# Patient Record
Sex: Male | Born: 1965 | Race: White | Hispanic: No | Marital: Single | Smoking: Former smoker
Health system: Southern US, Community
[De-identification: ages and names within clinical notes are randomized; demographics above are authoritative.]

## PROBLEM LIST (undated history)

## (undated) DIAGNOSIS — E7800 Pure hypercholesterolemia, unspecified: Secondary | ICD-10-CM

## (undated) DIAGNOSIS — G7100 Muscular dystrophy, unspecified: Secondary | ICD-10-CM

## (undated) DIAGNOSIS — K219 Gastro-esophageal reflux disease without esophagitis: Secondary | ICD-10-CM

## (undated) DIAGNOSIS — E119 Type 2 diabetes mellitus without complications: Secondary | ICD-10-CM

## (undated) HISTORY — DX: Pure hypercholesterolemia, unspecified: E78.00

## (undated) HISTORY — DX: Type 2 diabetes mellitus without complications: E11.9

## (undated) HISTORY — PX: OTHER SURGICAL HISTORY: SHX169

## (undated) HISTORY — DX: Gastro-esophageal reflux disease without esophagitis: K21.9

## (undated) HISTORY — DX: Muscular dystrophy, unspecified: G71.00

---

## 2001-12-09 ENCOUNTER — Ambulatory Visit (HOSPITAL_COMMUNITY): Admission: RE | Admit: 2001-12-09 | Discharge: 2001-12-09 | Payer: Self-pay | Admitting: Gastroenterology

## 2001-12-09 ENCOUNTER — Encounter: Payer: Self-pay | Admitting: Gastroenterology

## 2001-12-17 ENCOUNTER — Encounter: Payer: Self-pay | Admitting: Gastroenterology

## 2001-12-17 ENCOUNTER — Ambulatory Visit (HOSPITAL_COMMUNITY): Admission: RE | Admit: 2001-12-17 | Discharge: 2001-12-17 | Payer: Self-pay | Admitting: Gastroenterology

## 2002-06-09 ENCOUNTER — Ambulatory Visit (HOSPITAL_COMMUNITY): Admission: RE | Admit: 2002-06-09 | Discharge: 2002-06-09 | Payer: Self-pay | Admitting: *Deleted

## 2002-06-09 ENCOUNTER — Encounter: Payer: Self-pay | Admitting: *Deleted

## 2003-03-10 ENCOUNTER — Ambulatory Visit (HOSPITAL_COMMUNITY): Admission: RE | Admit: 2003-03-10 | Discharge: 2003-03-10 | Payer: Self-pay | Admitting: Family Medicine

## 2003-06-10 ENCOUNTER — Encounter: Admission: RE | Admit: 2003-06-10 | Discharge: 2003-09-08 | Payer: Self-pay | Admitting: Family Medicine

## 2003-09-10 ENCOUNTER — Encounter: Admission: RE | Admit: 2003-09-10 | Discharge: 2003-09-10 | Payer: Self-pay | Admitting: Family Medicine

## 2003-12-01 ENCOUNTER — Encounter: Admission: RE | Admit: 2003-12-01 | Discharge: 2003-12-01 | Payer: Self-pay | Admitting: Family Medicine

## 2004-02-11 ENCOUNTER — Encounter: Admission: RE | Admit: 2004-02-11 | Discharge: 2004-05-11 | Payer: Self-pay | Admitting: Family Medicine

## 2004-03-07 ENCOUNTER — Encounter: Admission: RE | Admit: 2004-03-07 | Discharge: 2004-04-18 | Payer: Self-pay

## 2004-05-12 ENCOUNTER — Encounter: Admission: RE | Admit: 2004-05-12 | Discharge: 2004-08-10 | Payer: Self-pay | Admitting: Family Medicine

## 2004-08-11 ENCOUNTER — Encounter: Admission: RE | Admit: 2004-08-11 | Discharge: 2004-11-09 | Payer: Self-pay | Admitting: Family Medicine

## 2010-05-30 ENCOUNTER — Inpatient Hospital Stay (HOSPITAL_COMMUNITY)
Admission: AD | Admit: 2010-05-30 | Discharge: 2010-05-31 | DRG: 394 | Disposition: A | Discharge status: Home / Self Care | Payer: Medicare Other | Source: Ambulatory Visit | Attending: Internal Medicine | Admitting: Internal Medicine

## 2010-05-30 DIAGNOSIS — E785 Hyperlipidemia, unspecified: Secondary | ICD-10-CM | POA: Diagnosis present

## 2010-05-30 DIAGNOSIS — K219 Gastro-esophageal reflux disease without esophagitis: Secondary | ICD-10-CM | POA: Diagnosis present

## 2010-05-30 DIAGNOSIS — E876 Hypokalemia: Secondary | ICD-10-CM | POA: Diagnosis not present

## 2010-05-30 DIAGNOSIS — G7109 Other specified muscular dystrophies: Secondary | ICD-10-CM | POA: Diagnosis present

## 2010-05-30 DIAGNOSIS — K644 Residual hemorrhoidal skin tags: Secondary | ICD-10-CM | POA: Diagnosis present

## 2010-05-30 DIAGNOSIS — K648 Other hemorrhoids: Principal | ICD-10-CM | POA: Diagnosis present

## 2010-05-30 DIAGNOSIS — E119 Type 2 diabetes mellitus without complications: Secondary | ICD-10-CM | POA: Diagnosis present

## 2010-05-30 DIAGNOSIS — I10 Essential (primary) hypertension: Secondary | ICD-10-CM | POA: Diagnosis present

## 2010-05-30 LAB — CBC
HCT: 42.3 % (ref 39.0–52.0)
Hemoglobin: 14.6 g/dL (ref 13.0–17.0)
MCH: 30.4 pg (ref 26.0–34.0)
MCHC: 34.5 g/dL (ref 30.0–36.0)
MCV: 88.1 fL (ref 78.0–100.0)
Platelets: 195 10*3/uL (ref 150–400)
RBC: 4.8 MIL/uL (ref 4.22–5.81)
RDW: 12.9 % (ref 11.5–15.5)
WBC: 7.4 10*3/uL (ref 4.0–10.5)

## 2010-05-30 LAB — COMPREHENSIVE METABOLIC PANEL
AST: 17 U/L (ref 0–37)
CO2: 24 mEq/L (ref 19–32)
Calcium: 9.1 mg/dL (ref 8.4–10.5)
Creatinine, Ser: <0.3 mg/dL — ABNORMAL LOW (ref 0.4–1.5)
Glucose, Bld: 209 mg/dL — ABNORMAL HIGH (ref 70–99)
Total Protein: 7.4 g/dL (ref 6.0–8.3)

## 2010-05-30 LAB — DIFFERENTIAL
Basophils Absolute: 0.1 10*3/uL (ref 0.0–0.1)
Basophils Relative: 1 % (ref 0–1)
Eosinophils Absolute: 0 10*3/uL (ref 0.0–0.7)
Eosinophils Relative: 1 % (ref 0–5)
Lymphocytes Relative: 39 % (ref 12–46)
Lymphs Abs: 2.9 10*3/uL (ref 0.7–4.0)
Monocytes Absolute: 0.5 10*3/uL (ref 0.1–1.0)
Monocytes Relative: 6 % (ref 3–12)
Neutro Abs: 3.9 10*3/uL (ref 1.7–7.7)
Neutrophils Relative %: 54 % (ref 43–77)

## 2010-05-30 LAB — MAGNESIUM: Magnesium: 2.3 mg/dL (ref 1.5–2.5)

## 2010-05-30 LAB — APTT: aPTT: 30 seconds (ref 24–37)

## 2010-05-30 LAB — LIPID PANEL
HDL: 33 mg/dL — ABNORMAL LOW (ref >39)
Total CHOL/HDL Ratio: 4.2 RATIO
VLDL: 44 mg/dL — ABNORMAL HIGH (ref 0–40)

## 2010-05-30 LAB — GLUCOSE, CAPILLARY: Glucose-Capillary: 121 mg/dL — ABNORMAL HIGH (ref 70–99)

## 2010-05-30 LAB — TYPE AND SCREEN: ABO/RH(D): A POS

## 2010-05-30 LAB — PHOSPHORUS: Phosphorus: 3.1 mg/dL (ref 2.3–4.6)

## 2010-05-30 LAB — PROTIME-INR
INR: 1.04 (ref 0.00–1.49)
Prothrombin Time: 13.8 seconds (ref 11.6–15.2)

## 2010-05-31 LAB — CBC
HCT: 40.4 % (ref 39.0–52.0)
MCH: 30.5 pg (ref 26.0–34.0)
MCHC: 34.2 g/dL (ref 30.0–36.0)
MCV: 89.2 fL (ref 78.0–100.0)
RDW: 13 % (ref 11.5–15.5)

## 2010-05-31 LAB — BASIC METABOLIC PANEL
BUN: 5 mg/dL — ABNORMAL LOW (ref 6–23)
Calcium: 8.3 mg/dL — ABNORMAL LOW (ref 8.4–10.5)
Creatinine, Ser: 0.3 mg/dL — ABNORMAL LOW (ref 0.4–1.5)
GFR calc non Af Amer: >60 mL/min (ref >60)
Glucose, Bld: 157 mg/dL — ABNORMAL HIGH (ref 70–99)
Potassium: 3.2 mEq/L — ABNORMAL LOW (ref 3.5–5.1)

## 2010-05-31 LAB — GLUCOSE, CAPILLARY: Glucose-Capillary: 146 mg/dL — ABNORMAL HIGH (ref 70–99)

## 2010-06-06 NOTE — H&P (Signed)
NAME:  DRAYSEN, WEYGANDT NO.:  [id number]  MEDICAL RECORD NO.:  [id number]           PATIENT TYPE:  I  LOCATION:  1528                         FACILITY:  [facility]  PHYSICIAN:  Rosanna Randy, MDDATE OF BIRTH:  1965/11/27  DATE OF ADMISSION:  05/30/2010 DATE OF DISCHARGE:                             HISTORY & PHYSICAL   PRIMARY CARE PHYSICIAN:  Tally Joe, MD, [facility]  CHIEF COMPLAINT:  Bright red blood per rectum.  HISTORY OF PRESENT ILLNESS:  The patient is a 45 year old male with significant past medical history for muscular dystrophy, diabetes mellitus type 2, gastroesophageal reflux disease, and also hyperlipidemia, who was admitted secondary to bright red blood per rectum.  According to the patient, he had been experiencing bright red blood per rectum over the last 3-4 weeks prior to admission intermittently and after discussing with his gastroenterologist, the decision was to bring him into the hospital in order to have a colonoscopy done unfortunately due to the patient's muscular dystrophy and his discapacity, it will be unable to have a good bowel preparation while staying at home.  The patient was also informed that during the preparation, electrolyte abnormalities can happen and they will be safer for him to have the scoping inside the hospital.  Dr. Bosie Clos contacted [facility] to help with the patient's admission and to look over his other medical problems.  The patient denies any nausea, vomiting, fever, chest pain or shortness of breath, and reports just some abdominal discomfort.  The patient also endorses that he experienced some constipation about 3-4 weeks ago for what he was started on MiraLax and since then is not having loose stools.  ALLERGIES:  The patient is allergic to PENICILLIN, allergic reaction rash.  PAST MEDICAL HISTORY:  Significant for: 1. Diabetes. 2. Hyperlipidemia. 3. Gastroesophageal reflux disease. 4.  Muscular dystrophy. 5. Questionable hypertension.  HOME MEDICATIONS:  Include hydrocodone 5/325 one to two tablets twice a day as needed for pain, Onglyza 5 mg 1 tablet by mouth daily, Crestor 5 mg 1 tablet by mouth daily, lisinopril 2.5 mg 1 tablet by mouth daily, Nexium 40 mg 1 capsule by mouth daily, MiraLax 17 g by mouth daily, fish oil over-the-counter 1 capsule by mouth daily.  SOCIAL HISTORY:  The patient denies alcohol, tobacco, or any illicit drugs.  He is currently living with his mother and his father at home, and the mother is the one providing pretty much all the care that he required.  He is at baseline, able to help son moving his arm in order to reposition, but he is unable to ambulate and is wheelchair bound without any assistance.  The patient is unable to change position when he is lying in bed.  FAMILY HISTORY:  Positive for diabetes, hypertension, otherwise noncontributory.  REVIEW OF SYSTEMS:  Ten organ systems were reviewed and they were negative except as mentioned on HPI.  PHYSICAL EXAM:  VITAL SIGNS:  Blood pressure 137/92, heart rate 79, respiratory rate 18, temperature 98.5, oxygen saturation 99% on room air. GENERAL:  The patient was lying in bed, no acute distress.  Cooperative to  examination, providing history appropriately. HEENT:  Head normocephalic without signs of any trauma.  Eyes:  PERRLA. Extraocular muscles intact.  Nonicteric.  No nystagmus.  Negative otorrhea or rhinorrhea.  The patient has moist mucous membranes.  Good dentition.  No exudates or erythema. NECK:  Supple.  No thyromegaly.  No bruits.  No JVD. RESPIRATORY:  Clear to auscultation bilaterally. HEART:  Regular rate and rhythm.  No murmurs were auscultated. ABDOMEN:  Soft, no distention.  No splenomegaly or hepatomegaly. Positive bowel sounds.  There is mild discomfort with deep palpation on his left lower quadrant and also mid abdomen. EXTREMITIES:  Lower extremity atrophic.   No edema.  Upper extremities also with mild atrophy appreciated and limited mobility but still able to perform some movement with them.  No erythema or swelling were appreciated in any of his limbs. SKIN:  No rash.  No active ulcers or lesions were identified.  The patient with mild erythema on his face which is chronic according to the patient and also mother who was at bedside during this interview.  The patient also with some redness in his buttocks.  No other petechiae or other lesions appreciated. NEUROLOGIC:  The patient was alert, awake, and oriented x3.  Cranial nerves II-XII grossly intact.  Muscle strength tested only on his upper extremities which demonstrated to be 3/5 bilaterally symmetrically. Gait was not tested due to the patient's inability to walk. PSYCHIATRIC:  Appropriate.  Laboratory work and images were pending at the moment of this dictation.  ASSESSMENT AND PLAN:  Have a bright red blood per rectum, had been going on for about a month now according to the patient and family members at bedside.  Plan is for the patient to have a colonoscopy in order to try to find out what is the source of this gastrointestinal bleed.  Dr. Bosie Clos of GI is aware of the situation and will be following the patient along with [place].  We will follow the recommendations.  At this point, the patient is going to be admitted to a regular diet since the bleeding is going on for so long and he does not looked to be in any acute distress and hemodynamically stable.  We are going to check his hemoglobin.  We are going to provide fluid resuscitation.  We are going to check for his electrolytes and we are going to provide also bowel prep for colonoscopy to be performed tomorrow. 1. Diabetes.  We are going to stop oral medications while he is in the     hospital due to decrease in his oral intake for the bowel     preparation and we are going to use sliding scale insulin.  The     patient is  going to be started on clear liquid diet.  We are going     to check a hemoglobin A1c. 2. Hyperlipidemia.  We are going to check a fasting lipid profile.  We     are going to continue statins. 3. Gastroesophageal reflux disease.  Plan is to continue Nexium by     mouth daily. 4. Hypertension.  He is a little bit up from a goal for a patient with     diabetes.  He is currently using 2.5 mg of lisinopril which might     be enough to control this level of hypertension and at the same     time provide renal protection due to his diabetes status.  We are  going to continue the same medications while he is in the hospital. 5. The patient's muscular dystrophy.  At this point, we are going to     continue providing supportive care and for     the chronic pain that comes along with this muscular dystrophy, we     are going to use morphine 1-2 mg IV for severe pain.  The rest of     the patient's test orders are going to be determined depending     evaluation of his condition.     Rosanna Randy, MD     CEM/MEDQ  D:  05/30/2010  T:  05/30/2010  Job:  [id number]  cc:   Tally Joe, M.D. Fax: [phone]  Shirley Friar, MD Fax: [phone]  Electronically Signed by Vassie Loll MD on 06/06/2010 10:17:14 PM

## 2010-06-06 NOTE — Discharge Summary (Signed)
NAME:  [name], [name] NO.:  [id number]  MEDICAL RECORD NO.:  [id number]           PATIENT TYPE:  I  LOCATION:  1528                         FACILITY:  [facility]  PHYSICIAN:  Rosanna Randy, MDDATE OF BIRTH:  01-02-1966  DATE OF ADMISSION:  05/30/2010 DATE OF DISCHARGE:  05/31/2010                              DISCHARGE SUMMARY   PRIMARY CARE PHYSICIAN:  Tally Joe, M.D.  GASTROENTEROLOGIST:  Shirley Friar, MD.  DISCHARGE DIAGNOSIS: 1. Bright red blood per rectum secondary to internal hemorrhoids,     status post colonoscopy that demonstrated no active bleeding at     this moment. 2. Hypokalemia after bowel preparation for colonoscopy, repleted prior     to discharge. 3. Diabetes mellitus with a hemoglobin A1c of 8.3. 4. Hypertension. 5. Hyperlipidemia. 6. Gastroesophageal reflux disease. 7. Muscular dystrophy. 8. History of esophageal stricture status post dilatation in 2004.  DISCHARGE MEDICATIONS: 1. Flora-Q 1 capsule by mouth daily. 2. Crestor 5 mg 1 tablet by mouth daily. 3. Fish oil over-the-counter 1 capsule by mouth daily. 4. Hydrocodone 5325 one to two tablets by mouth twice daily as needed     for pain. 5. Lisinopril 2.5 mg 1 tablet by mouth daily. 6. MiraLax 17 grams by mouth daily. Patient received additional     information and instructions to hold the MiraLax if he is having     more than two bowel movement a day. 7. Nexium 40 mg 1 capsule by mouth daily. 8. Onglyza  5 mg 1 tablet by mouth daily.  DISPOSITION ON FOLLOWUP:  The patient had been discharged in a stable condition, currently not having any active bleeding and status post colonoscopy without any complications that demonstrated medium sized internal hemorrhoids, otherwise normal. The patient is going to arrange followup appointment with primary care physician in about 2 weeks in order to have at that time adjustment of the medications for his chronic medical  conditions specifically adjustment of the medication for his diabetes since his hemoglobin A1c is 8.3 and also adjustment of the medications for his blood pressure in case that is needed.  Throughout the hospitalization, the patient's blood pressure was notable meaning that it was more than 130/80 which is not recommended for patients with diabetes but he was on a different vitamin having some abdominal discomfort, stressed out by a bowel perforation and a new procedure and this could definitely be accounted for his elevated blood pressure. This is the main reason why blood pressure needs to be reevaluated as an outpatient.  Any further adjustment needed they can take place as indicated by PCP.  Procedure performed during this hospitalization, the patient had a colonoscopy per Dr. Bosie Clos that demonstrated a medium sized internal hemorrhoids.  No actively bleeding.  Otherwise, normal study.  No other procedures were performed. GI: [facility], specifically Dr. Bosie Clos and also Dr. Michaell Cowing for [facility] were consulted during this admission.  Please refer to consultation notes dictated by each of these physicians for further details and their recommendations.  HISTORY OF PRESENT ILLNESS:  The patient is a 45 year old male with a significant  past medical history for muscular dystrophy, wheelchair bounded  since he was in high school, also with diabetes mellitus type II, gastroesophageal reflux disease and also hyperlipidemia who was admitted secondary to bright red blood per rectum. According to the patient, he had been experiencing bright red blood per rectum over the last 3-4 weeks prior to admission intermittently and after discussing with his gastroenterologist, the decision was to bring the patient into the hospital in order to have a colonoscopy, since the patient due to his muscular dystrophy and disability will be unable to have a good bowel preparation or deal with any  side effects coming after the bowel preparation.  The patient was also informed that during the preparation electrolytes abnormalities can happen and that it will be safer for him to have the scoping inside the hospital.  Dr. Bosie Clos contacted [facility] to help with the patient's admission and to look over his other medical problems.  The patient denies any nausea, vomiting, fever, chest pain, or shortness of breath and report just some abdominal discomfort intermittently going along with his bright red blood per rectum. The patient also endorses that he experienced some constipation about 3 to 4 weeks ago for what he was started on MiraLax and since than having loose stools.  SIGNIFICANT LABORATORY DATA THROUGHOUT THIS HOSPITALIZATION:  The patient had on admission a CBC that demonstrated white blood cells 7.4, hemoglobin 14.6, platelets 195.  PT was 13.8 with an INR of 1.04, PTT 30.  Comprehensive metabolic panel demonstrated a sodium of 138, potassium 3.6, chloride 103, bicarb 24, blood sugar 209, BUN 12, creatinine less than 0.3,  magnesium was 2.3, phosphorus 3.1, and lipid profile done demonstrated a total cholesterol of 138, triglyceride 220, HDL 33, LDL 61, TSH was 0.878, hemoglobin A1c 8.3.  A subsequent CBC that was done in order to follow on the patient's hemoglobin demonstrated white blood cells of 9.8 with a hemoglobin of 13.8 and a platelet count of 202.  This was done on May 31, 2010.  HOSPITAL COURSE BY PROBLEM:  Bright red blood per rectum, status post colonoscopy demonstrated to be secondary to medium sized internal hemorrhoids.  At this point the patient's hemorrhoids were not actively bleeding and the patient is hemodynamically stable. Otherwise, the patient's colonoscopy was completely normal.  Surgery consultation was made and per Dr. Michaell Cowing at this moment since the patient is not having any strangulated or thrombosed hemorrhoids and they are just medium  size internal hemorrhoids, no acute surgical treatment is required.  The patient is going to be followed by primary care physician and also is going to be followed by his GI doctor as an outpatient for followup of his condition.  He had been instructed to use Flora-Q 1 capsule by mouth daily and to continue using MiraLax to prevent constipation and by that erosion of the hemorrhoids. Patient's  hypokalemia secondary to a bowel preparation.  Potassium was repleted prior to the patient being discharged. The patient's diabetes with a hemoglobin A1c of 8.3. The patient had been discharged on his hypoglycemic oral agents with instructions to follow with primary care physician for further adjustment as an outpatient.  He was also instructed to follow a low carbohydrate diet. The patient's hypertension.  He is going to continue using lisinopril and is going to have adjustment of his antihypertensive drugs if needed per primary care doctor as an outpatient. The patient's hyperlipidemia and low triglycerides.  Plan is to continue statins and also fish oil. Patient's  gastroesophageal reflux disease.  Plan is to continue using PPI. Patient's muscular dystrophy. Plan is for the patient to continue receiving supportive care.  At discharge the patient's temperature 98.4, heart rate 91, respiratory rate 18, blood pressure 122/85, oxygen saturation 95% on room air.  In general, the patient was in no acute distress, respiratory system clear to auscultation bilaterally.  Heart auscultation, regular rate and rhythm. S1,S2 were heard on auscultation.  There was no murmurs, gallops or rubs.  Abdomen: Soft, positive bowel sounds, nontender, nondistended. Extremities no edema.  There was atrophic changes appreciated on his upper and lower extremity.  Otherwise normal.  Neurologic exam no focal deficit apart from his chronic muscular dystrophy changes  disability.  Lab work demonstrated a sodium of 139, a  potassium of 3.2 prior to receive repletion with 40 mEq of potassium q. 4 h x3 doses, chloride 107, bicarb 22, BUN 5, creatinine 0.3, platelets 157.     Rosanna Randy, MD     CEM/MEDQ  D:  05/31/2010  T:  06/01/2010  Job:  [id number]  cc:   Tally Joe, M.D. Fax: [phone]  Shirley Friar, MD Fax: [phone]  Electronically Signed by Vassie Loll MD on 06/06/2010 10:17:37 PM

## 2010-06-14 NOTE — Consult Note (Signed)
NAME:  [name], [name] NO.:  [id number]  MEDICAL RECORD NO.:  [id number]           PATIENT TYPE:  I  LOCATION:  1528                         FACILITY:  [facility]  PHYSICIAN:  Ardeth Sportsman, MD     DATE OF BIRTH:  08/07/1965  DATE OF CONSULTATION:  05/30/2010 DATE OF DISCHARGE:                                CONSULTATION   REQUESTING PHYSICIAN:  Shirley Friar, MD.  PRIMARY CARE PHYSICIAN:  Tally Joe, M.D.  REASON FOR CONSULTATION:  Internal hemorrhoids.  BRIEF HISTORY:  The patient is a 45 year old white male with a history of muscular dystrophy.  He has essentially been wheelchair bound since before high school.  He had 3 to 4 weeks of rectal bleeding.  It is not continuous.  It is intermittent.  It was noticed by his mom after bowel movement and after he sits on the toilet for prolonged time, he may have a small amount of blood in the basin.  He was seen by [facility] where they have attempted to treat him medically.  He was on metformin for a time and this was causing diarrhea but he has problems with his stools going back at least December 2011.  He has been treated with MiraLax and he was given Analpram but his a insurance would not pay for that. Anusol suppositories were unable to be retained.  He was admitted at this time for bowel prep and then colonoscopy to make sure there is no other source of bleeding.  Today, after completion of the prep, he underwent colonoscopy by Dr. Bosie Clos.  This showed a small non- thrombosed external hemorrhoids.  Colonoscopy was normal down to the cecum.  There were also medium-sized internal hemorrhoids, which was the source of bleeding in his opinion.  At that point, we were contacted to see the patient while he is in the hospital.  PAST MEDICAL HISTORY: 1. Muscular dystrophy, he can move his arms enough to use a remote     control or a computer mouse but he is unable to reposition himself.     His family gets  him in and out of bed where they live.  He spends     the majority of his time in wheelchair. 2. Adult-onset diabetes mellitus. 3. Dyslipidemia. 4. Questionable hypertension. 5. GERD.  PAST SURGICAL HISTORY:  He had a heel cord procedure in the 1970s.  He has had an esophageal dilatation and a history of external hemorrhoids.  FAMILY HISTORY:  Mom has high blood pressure and diabetes.  Father has diabetes and history of coronary artery disease and CABG.  Mother also has history of colon polyps.  Brother is in good health.  SOCIAL HISTORY:  He smoked from about 2 years but none since 94. Alcohol, occasional.  Drugs, none.  He is single and is cared for at home.  CURRENT MEDICATIONS: 1. Hydrocodone 5/325 q.4. 2. Onglyza 5 mg daily. 3. Crestor 5 mg daily. 4. Lisinopril 2.5 mg daily. 5. Nexium 40 mg daily. 6. MiraLax 17 g daily. 7. Fish oil 1 daily.  REVIEW OF SYSTEMS:  Fever:  None.  Weight:  No changes.  SKIN:  He has occasional sores on his legs after sitting on commode for an extended period.  CEREBROVASCULAR:  Negative.  GI:  He has trouble swallowing. He has to have mechanical soft type of diet.  He has to chew the foods carefully and his mom feeds him.  He has GERD symptoms.  He will sit up on the toilet from anywhere to 1-3 hours to have a bowel movement. Positive for blood in his stool and in the toilet.  PULMONARY:  He has problems with increased secretions especially when he is lying down. They have handled these mostly with mechanically changing his positions. GU:  No trouble voiding.  Currently, he has a condom catheter on.  LOWER EXTREMITIES:  He gets occasional edema.  He is not unable to walk. MUSCULOSKELETAL:  Muscular dystrophy with only some minor upper extremity motion.  ALLERGIES:  PENICILLIN caused a rash many years ago.  PHYSICAL EXAMINATION:  GENERAL:  This is a well nourished, somewhat overweight white male, in no acute distress.  He is just back  from endoscopy, so is a little sleepy. VITAL SIGNS:  Weight is 162.6 pounds.  His height is estimated at 66 inches.  Temperature is 98.4, earlier was 97.7, now heart rate is 93, blood pressure is 120/79, sats are 97% on room air, respiratory rate is 14. HEENT:  Head:  Normocephalic.  Eyes, ears, nose and throat are normal. NECK:  Trachea is in the midline.  Thyroid is palpable. CHEST:  Clear to auscultation. CARDIAC:  Slightly tachy.  Normal S1 and S2.  Pulses are present in both upper and lower extremities. ABDOMEN:  He says he feels bloated but the abdomen shows normal bowel sounds, nondistended, nontender.  There are no masses, hernias or abscesses. RECTAL:  External exam showed no external hemorrhoids.  Dr. Michaell Cowing did do an internal rectal exam and did feel hemorrhoids, which were extremely tender and painful. GENITALIA:  Condom catheter in place. LYMPH NODE:  Lymphadenopathy, none palpated. MUSCULOSKELETAL:  He has both upper extremity and lower extremity weakness.  He cannot turn in bed that on his own.  NEUROLOGIC:  Cranial nerves II through XII are grossly within normal limits.  He has both upper and lower extremity and trunk weakness. PSYCH:  Normal.  LABORATORY DATA:  White count 7.4, hemoglobin 14.6, hematocrit 42, platelets 195,000.  Pro time 13.8, INR is 1.04, PTT is 30.  Electrolytes are normal.  BUN 12, creatinine less than 0.3, glucose was 209, mag was 2.3.  TSH 0.87, phos was 3.1.  Hemoglobin A1c is 8.3 yesterday.  Today, his white count is 9.8, hemoglobin 13.8, hematocrit 40, platelets 202,000.  Sodium is 139, potassium is slightly diminished at 3.2, chloride is 105, CO2 is 22, BUN is 5, creatinine is 0.3.  Endoscopy as noted above.  IMPRESSION: 1. Non-thrombosed external and medium-sized intermediate hemorrhoids.     The external hemorrhoids are currently not visible. 2. Muscular dystrophy, requiring complete support of his major     activities up in the  wheelchair since before he entered high     school. 3. Adult-onset diabetes mellitus. 4. Gastroesophageal reflux disease. 5. Dyslipidemia.  PLAN:  Dr. Michaell Cowing has seen and examined the patient and talked with him and his family extensively.  At this point, he thinks major focus should be to have the patient obtain a single smooth bowel movement daily and to spend much shorter time on the toilet in order to allow  his hemorrhoids to resolve on their own.  He has talked to Dr. Bosie Clos and we will follow him up as needed as an outpatient.     Eber Hong, P.A.   ______________________________ Ardeth Sportsman, MD    WDJ/MEDQ  D:  05/31/2010  T:  05/31/2010  Job:  [id number]  cc:   Shirley Friar, MD Fax: [phone]  Tally Joe, M.D. Fax: [phone]  Electronically Signed by Sherrie George P.A. on 06/06/2010 03:33:36 PM Electronically Signed by Karie Soda MD on 06/14/2010 12:06:01 PM

## 2010-06-30 NOTE — Op Note (Signed)
  NAME:  [name], [name] NO.:  [id number]  MEDICAL RECORD NO.:  [id number]           PATIENT TYPE:  I  LOCATION:  1528                         FACILITY:  [facility]  PHYSICIAN:  Shirley Friar, MDDATE OF BIRTH:  02-01-1965  DATE OF PROCEDURE: DATE OF DISCHARGE:                              OPERATIVE REPORT   INDICATION:  Rectal bleeding.  MEDICATIONS:  Fentanyl 100 mcg IV, Versed 2 mg IV, propofol 100 mg IV per Anesthesia.  FINDINGS:  Rectal examination revealed small non-thrombosed external hemorrhoids.  A pediatric colonoscope was inserted into an adequately prepped colon (good prep) and the colonoscope was advanced to the cecum where the ileocecal valve and appendiceal orifice were identified.  In order to reach the cecum, repeated loop reduction was necessary.  The terminal ileum could not be intubated due to excessive looping.  On careful withdrawal of the colonoscope, the colonic mucosa was normal inappearance.  No mucosal abnormalities were seen.  Retroflexion revealed medium-sized internal hemorrhoids.  On slow withdrawal from the rectum, the hemorrhoids were again visualized.  ASSESSMENT:  Medium-sized internal hemorrhoids, which is the source of his rectal bleeding.  Otherwise normal colonoscopy.  PLAN: 1. We will consult with Surgery regarding inpatient versus outpatient     management of these hemorrhoids as he has failed to respond to     topical therapy. 2. Repeat colonoscopy in 5 years due to mother's history of colon     polyps.     Shirley Friar, MD     VCS/MEDQ  D:  05/31/2010  T:  05/31/2010  Job:  [id number]  cc:   Tally Joe, M.D. Fax: [phone]  Electronically Signed by Charlott Rakes MD on 06/30/2010 10:17:00 PM

## 2011-01-16 DIAGNOSIS — G121 Other inherited spinal muscular atrophy: Secondary | ICD-10-CM | POA: Insufficient documentation

## 2013-07-10 DIAGNOSIS — K625 Hemorrhage of anus and rectum: Secondary | ICD-10-CM | POA: Insufficient documentation

## 2013-08-26 DIAGNOSIS — K645 Perianal venous thrombosis: Secondary | ICD-10-CM | POA: Insufficient documentation

## 2013-12-15 LAB — LIPID PANEL
Cholesterol: 146 mg/dL (ref 0–200)
HDL: 33 mg/dL — AB (ref 35–70)
LDL CALC: 60 mg/dL
LDL/HDL RATIO: 4.4
TRIGLYCERIDES: 266 mg/dL — AB (ref 40–160)

## 2013-12-15 LAB — HEPATIC FUNCTION PANEL
ALT: 19 U/L (ref 10–40)
AST: 13 U/L — AB (ref 14–40)
Alkaline Phosphatase: 50 U/L (ref 25–125)
BILIRUBIN, TOTAL: 0.3 mg/dL

## 2013-12-15 LAB — BASIC METABOLIC PANEL
BUN: 12 mg/dL (ref 4–21)
Creatinine: 0.2 mg/dL — AB (ref 0.6–1.3)
GLUCOSE: 209 mg/dL
Potassium: 3.9 mmol/L (ref 3.4–5.3)
Sodium: 136 mmol/L — AB (ref 137–147)

## 2013-12-15 LAB — HEMOGLOBIN A1C: HEMOGLOBIN A1C: 8.9 % — AB (ref 4.0–6.0)

## 2014-02-09 ENCOUNTER — Encounter: Payer: Self-pay | Admitting: Gastroenterology

## 2014-02-11 ENCOUNTER — Encounter: Payer: Self-pay | Admitting: Internal Medicine

## 2014-02-11 ENCOUNTER — Other Ambulatory Visit: Payer: Self-pay | Admitting: *Deleted

## 2014-02-11 ENCOUNTER — Ambulatory Visit (INDEPENDENT_AMBULATORY_CARE_PROVIDER_SITE_OTHER): Payer: Medicare Other | Admitting: Internal Medicine

## 2014-02-11 VITALS — BP 124/70 | HR 93 | Temp 98.3°F | Resp 12 | Wt 165.0 lb

## 2014-02-11 DIAGNOSIS — E119 Type 2 diabetes mellitus without complications: Secondary | ICD-10-CM

## 2014-02-11 DIAGNOSIS — E1165 Type 2 diabetes mellitus with hyperglycemia: Secondary | ICD-10-CM | POA: Insufficient documentation

## 2014-02-11 MED ORDER — ONETOUCH DELICA LANCETS 33G MISC: Status: AC

## 2014-02-11 MED ORDER — METFORMIN HCL 500 MG/5ML PO SOLN: ORAL | Status: DC

## 2014-02-11 MED ORDER — GLUCOSE BLOOD VI STRP: ORAL_STRIP | Status: DC

## 2014-02-11 NOTE — Patient Instructions (Addendum)
Please continue Onglyza 5 mg in am.  Please start Metformin 500 mg (5 mL, 1 teaspoon) with dinner x 4 days. If you tolerate this well, add another Metformin tablet (500 mg) with breakfast x 4 days. If you tolerate this well, add another metformin tablet with dinner (total 1000 mg) x 4 days. If you tolerate this well, add another metformin tablet with breakfast (total 1000 mg). Continue with 1000 mg (10 mL, 2 teaspoons) of metformin 2x a day with breakfast and dinner.  Please return in 1 month with your sugar log.

## 2014-02-11 NOTE — Progress Notes (Signed)
Patient ID: UNNAMED ZEIEN, male   DOB: 08-05-1965, 49 y.o.   MRN: [id number]  HPI: [name] is a 49 y.o.-year-old male, referred by his PCP, Dr. Azucena Cecil, for management of DM2, dx in2003, non-insulin-dependent, uncontrolled, without complications. He has [employer] now.  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 In the last 2 years, his swallowing is more impaired. Mother is primary caregiver.   Hemoglobin A1c levels reviewed per records from PCP and per Epic records: 12/15/2013: HbA1c 8.9% 06/12/2013: HbA1c 7.8% 12/13/2012: HbA1c 7.5% 12/07/2011: HbA1c 6.9% Lab Results  Component Value Date   HGBA1C 8.3 05/30/2010   Pt is on a regimen of: - Onglyza 5 mg daily He tried Metformin >> diarrhea. He saw Dr Lucianne Muss then >> decreased the dose >> this helped, but then taken off when started Onglyza.  Pt does not check his sugars. Does not have a meter at home.  ? lows; he has hypoglycemia awareness at 70.   Pt's meals are: - Breakfast: skips - Lunch: soup, sandwich, burger, diet Mtn dew - Dinner: grilled chicken, veggies, starch, diet sods - Snacks: PB, cookie  - no CKD, last BUN/creatinine:  06/12/2013: 12/0.20 Lab Results  Component Value Date   BUN 5* 05/31/2010   CREATININE 0.30* 05/31/2010   - last set of lipids: 12/15/2013: 146/266/33/60 Lab Results  Component Value Date   CHOL  05/30/2010    138        ATP III CLASSIFICATION:  <200     mg/dL   Desirable  353-614  mg/dL   Borderline High  >=431    mg/dL   High          HDL 33* 05/30/2010   LDLCALC  05/30/2010    61        Total Cholesterol/HDL:CHD Risk Coronary Heart Disease Risk Table                     Men   Women  1/2 Average Risk   3.4   3.3  Average Risk       5.0   4.4  2 X Average Risk   9.6   7.1  3 X Average Risk  23.4   11.0        Use the calculated Patient Ratio above and the CHD Risk Table to determine the patient's CHD Risk.        ATP III  CLASSIFICATION (LDL):  <100     mg/dL   Optimal  540-086  mg/dL   Near or Above                    Optimal  130-159  mg/dL   Borderline  761-950  mg/dL   High  >932     mg/dL   Very High   TRIG 671* 05/30/2010   CHOLHDL 4.2 05/30/2010   - last eye exam was in 12/2013. No DR. - no numbness and tingling in his feet. Has good sensation, but cannot move. He cannot move his legs  Pt has FH of DM in mother and father.  ROS: Constitutional: no weight gain/loss, no fatigue, no subjective hyperthermia/hypothermia, + nocturiaEyes: no blurry vision, no xerophthalmia ENT: no sore throat, no nodules palpated in throat, + dysphagia/no odynophagia, no hoarseness Cardiovascular: no CP/SOB/palpitations/leg swelling Respiratory: no cough/SOB Gastrointestinal: no N/V/+ D/no C/+ acid reflux Musculoskeletal: no muscle/joint aches Skin: no rashes Neurological: no  tremors/numbness/tingling/dizziness Psychiatric: no depression/anxiety  Past Medical History  Diagnosis Date  . Diabetes mellitus without complication     DM Type II   . GERD (gastroesophageal reflux disease)   . Hypercholesterolemia   . Muscular dystrophy    No past surgical history on file. History   Social History  . Marital Status: single    Spouse Name: N/A    Number of Children: 0   Occupational History  . disabled   Social History Main Topics  . Smoking status: Former Games developermoker  . Smokeless tobacco: Not on file  . Alcohol Use: No  . Drug Use: No   Current Outpatient Rx  Name  Route  Sig  Dispense  Refill  . esomeprazole (NEXIUM) 40 MG capsule   Oral   Take 40 mg by mouth daily.          Marland Kitchen. HYDROcodone-acetaminophen (HYCET) 7.5-325 mg/15 ml solution   Oral   Take 15 mLs by mouth at bedtime.          Marland Kitchen. lisinopril (PRINIVIL,ZESTRIL) 2.5 MG tablet   Oral   Take 2.5 mg by mouth.         . mesalamine (CANASA) 1000 MG suppository   Rectal   Place 1,000 mg rectally.         . rosuvastatin (CRESTOR) 5 MG  tablet   Oral   Take 5 mg by mouth.         . saxagliptin HCl (ONGLYZA) 5 MG TABS tablet   Oral   Take 5 mg by mouth.         . gabapentin (NEURONTIN) 250 MG/5ML solution   Oral   Take by mouth 3 (three) times daily.         Marland Kitchen. glucose blood (ONETOUCH VERIO) test strip      Use to test blood sugar 2 times daily as instructed.   100 each   11   . Metformin HCl 500 MG/5ML SOLN      Take 10 ml (2 teaspoons, 1000 mg) by mouth 2x a day with a meal   300 mL   2   . ONETOUCH DELICA LANCETS 33G MISC      Use to test blood sugar 2 times daily as instructed.   100 each   11    Allergies  Allergen Reactions  . Penicillins Hives   Family History  Problem Relation Age of Onset  . Diabetes Mother   . Colon polyps Mother   . Heart disease Father     CAD  . Diabetes Father    PE: BP 124/70 mmHg  Pulse 93  Temp(Src) 98.3 F (36.8 C) (Oral)  Resp 12  Wt 165 lb (74.844 kg)  SpO2 98% Wt Readings from Last 3 Encounters:  02/11/14 165 lb (74.844 kg)   Constitutional: overweight, in NAD, in wheelchair. Moves few mm groups, but can talk and eat (see HPI)  Eyes: PERRLA, EOMI, no exophthalmos ENT: moist mucous membranes, no thyromegaly, no cervical lymphadenopathy Cardiovascular: RRR, No MRG Respiratory: CTA B Gastrointestinal: abdomen soft, NT, ND, BS+ Musculoskeletal: tetraparesis Skin: moist, warm, no rashes Neurological: tetraparesis  ASSESSMENT: 1. DM2, non-insulin-dependent, uncontrolled, without complications  PLAN:  1. Patient with long-standing, uncontrolled diabetes, on oral antidiabetic regimen, which became insufficient. I do not have CBG data to go by >> pt was given a OneTouch Verio meter, will start checking and bring log at next visit.  - We discussed about options for treatment, and I suggested  to add liquid metformin as he has impaired swallowing due to his disease:  Patient Instructions  Please continue Onglyza 5 mg in am.  Please start Metformin  500 mg (5 mL, 1 teaspoon) with dinner x 4 days. If you tolerate this well, add another Metformin tablet (500 mg) with breakfast x 4 days. If you tolerate this well, add another metformin tablet with dinner (total 1000 mg) x 4 days. If you tolerate this well, add another metformin tablet with breakfast (total 1000 mg). Continue with 1000 mg (10 mL, 2 teaspoons) of metformin 2x a day with breakfast and dinner.  Please return in 1 month with your sugar log.   - Strongly advised him to start checking sugars at different times of the day (actually his mother will do the checks) - check 2 times a day, rotating checks - given sugar log and advised how to fill it and to bring it at next appt  - given foot care handout and explained the principles  - given instructions for hypoglycemia management "15-15 rule"  - advised for yearly eye exams - we contacted PCP for records at the time of the appt >> reviewed them  - will scan - Return to clinic in 1 mo with sugar log

## 2014-03-04 ENCOUNTER — Telehealth: Payer: Self-pay | Admitting: Internal Medicine

## 2014-03-04 NOTE — Telephone Encounter (Signed)
Patient mom stated that her son medication Metformin 500 mg is giving her son diarrhea. Please advise

## 2014-03-04 NOTE — Telephone Encounter (Signed)
I was unable to put this in. I tried many different ways. Pt is on the solution formula. Please advise.

## 2014-03-04 NOTE — Telephone Encounter (Signed)
Let's try the ER Metformin - please try to send the Glucophage XR (DAW!) at the same doses - start with 500 mg bid and increase slowly.

## 2014-03-04 NOTE — Telephone Encounter (Signed)
Please read note below and advise.  

## 2014-03-05 MED ORDER — GLIPIZIDE ER 5 MG PO TB24: 5.0000 mg | ORAL_TABLET | Freq: Every day | ORAL | Status: DC

## 2014-03-05 NOTE — Telephone Encounter (Signed)
Sent to [facility], [address]

## 2014-03-05 NOTE — Telephone Encounter (Signed)
I am not seeing Glipizide XL in the solution formula. Please advise.

## 2014-03-05 NOTE — Telephone Encounter (Signed)
Oh, yes, I forgot he is on liquid metformin. This cannot come in an XR version. Let's start Glipizide XL 5 mg in am. Check sugars more frequently in the first 1-2 days after starting to ensure there are no low CBGs.

## 2014-03-05 NOTE — Telephone Encounter (Signed)
No, this is a much smaller pill >> he can likely swallow it in tablet form

## 2014-03-17 ENCOUNTER — Ambulatory Visit: Payer: Medicaid Other | Admitting: Internal Medicine

## 2014-03-23 ENCOUNTER — Encounter: Payer: Self-pay | Admitting: Internal Medicine

## 2014-03-23 ENCOUNTER — Ambulatory Visit (INDEPENDENT_AMBULATORY_CARE_PROVIDER_SITE_OTHER): Payer: Medicare Other | Admitting: Internal Medicine

## 2014-03-23 VITALS — BP 112/68 | HR 88 | Temp 98.7°F | Resp 12

## 2014-03-23 DIAGNOSIS — E119 Type 2 diabetes mellitus without complications: Secondary | ICD-10-CM | POA: Diagnosis not present

## 2014-03-23 LAB — HEMOGLOBIN A1C: Hgb A1c MFr Bld: 8.6 % — ABNORMAL HIGH (ref 4.6–6.5)

## 2014-03-23 MED ORDER — INSULIN GLARGINE 100 UNIT/ML SOLOSTAR PEN: 18.0000 [IU] | PEN_INJECTOR | Freq: Every day | SUBCUTANEOUS | Status: DC

## 2014-03-23 MED ORDER — INSULIN PEN NEEDLE 32G X 4 MM MISC: Status: DC

## 2014-03-23 NOTE — Patient Instructions (Signed)
Please continue: - Onglyza 5 mg daily in am - Glipizide XL 5 mg in am  Start Lantus 18 units at bedtime. If sugars in am not <150 in 4-5 days, increase the dose to 22 units.  Please call me about your sugars in 2 weeks.  Please return in 1 month with your sugar log.   Please stop at the lab.

## 2014-03-23 NOTE — Progress Notes (Signed)
Patient ID: [name], male   DOB: 12-Jun-1965, 49 y.o.   MRN: [id number]  HPI: [name] is a 49 y.o.-year-old male, referred by his PCP, Dr. Azucena Cecil, for management of DM2, dx in2003, insulin-dependent, uncontrolled, without complications. He has [employer] now.  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 In the last 2 years, his swallowing is more impaired. Mother is primary caregiver.   Hemoglobin A1c levels reviewed per records from PCP and per Epic records: 12/15/2013: HbA1c 8.9% 06/12/2013: HbA1c 7.8% 12/13/2012: HbA1c 7.5% 12/07/2011: HbA1c 6.9% Lab Results  Component Value Date   HGBA1C 8.3 05/30/2010   Pt is on a regimen of: - Onglyza 5 mg daily - Glipizide XL 5 mg in am He tried Metformin >> diarrhea. He saw Dr Lucianne Muss then >> decreased the dose >> this helped, but then taken off when started Onglyza. We tried again Metformin >> diarrhea >> now stopped.   Pt does check his sugars at home:  - before lunch: 205-390  - 2h after lunch: 210-268 - before dinner: 238-315 - after dinner: 222-278  No lows; he has hypoglycemia awareness at 70.   Pt's meals are: - Breakfast: skips - Lunch: soup, sandwich, burger, diet Mtn dew - Dinner: grilled chicken, veggies, starch, diet sods - Snacks: PB, cookie  - no CKD, last BUN/creatinine:  06/12/2013: 12/0.20 Lab Results  Component Value Date   BUN 12 12/15/2013   CREATININE 0.2* 12/15/2013   - last set of lipids: 12/15/2013: 146/266/33/60 Lab Results  Component Value Date   CHOL 146 12/15/2013   HDL 33* 12/15/2013   LDLCALC 60 12/15/2013   TRIG 266* 12/15/2013   CHOLHDL 4.2 05/30/2010   - last eye exam was in 12/2013. No DR. - no numbness and tingling in his feet. Has good sensation, but cannot move. He cannot move his legs  Pt has FH of DM in mother and father.  ROS: Constitutional: no weight gain/loss, no fatigue, no subjective hyperthermia/hypothermia, +  nocturia Eyes: no blurry vision, no xerophthalmia ENT: no sore throat, no nodules palpated in throat, + dysphagia/no odynophagia, no hoarseness Cardiovascular: no CP/SOB/palpitations/leg swelling Respiratory: + cough/no SOB Gastrointestinal: no N/V/D/C/+ acid reflux Musculoskeletal: no muscle/joint aches Skin: no rashes Neurological: no tremors/numbness/tingling/dizziness  Past Medical History  Diagnosis Date  . Diabetes mellitus without complication     DM Type II   . GERD (gastroesophageal reflux disease)   . Hypercholesterolemia   . Muscular dystrophy    No past surgical history on file. History   Social History  . Marital Status: single    Spouse Name: N/A    Number of Children: 0   Occupational History  . disabled   Social History Main Topics  . Smoking status: Former Games developer  . Smokeless tobacco: Not on file  . Alcohol Use: No  . Drug Use: No   Current Outpatient Rx  Name  Route  Sig  Dispense  Refill  . esomeprazole (NEXIUM) 40 MG capsule   Oral   Take 40 mg by mouth daily.          Marland Kitchen gabapentin (NEURONTIN) 250 MG/5ML solution   Oral   Take by mouth 3 (three) times daily.         Marland Kitchen glipiZIDE (GLUCOTROL XL) 5 MG 24 hr tablet   Oral   Take 1 tablet (5 mg total) by mouth daily with breakfast.   30 tablet  2     ADVISE PT SMALL PILL WILL BE ABLE TO TAKE THIS.   . glucose blood (ONETOUCH VERIO) test strip      Use to test blood sugar 2 times daily as instructed.   100 each   11   . HYDROcodone-acetaminophen (HYCET) 7.5-325 mg/15 ml solution   Oral   Take 15 mLs by mouth at bedtime.          Marland Kitchen. lisinopril (PRINIVIL,ZESTRIL) 2.5 MG tablet   Oral   Take 2.5 mg by mouth.         . mesalamine (CANASA) 1000 MG suppository   Rectal   Place 1,000 mg rectally.         Letta Pate. ONETOUCH DELICA LANCETS 33G MISC      Use to test blood sugar 2 times daily as instructed.   100 each   11   . rosuvastatin (CRESTOR) 5 MG tablet   Oral   Take 5 mg by  mouth.         . saxagliptin HCl (ONGLYZA) 5 MG TABS tablet   Oral   Take 5 mg by mouth.         . Metformin HCl 500 MG/5ML SOLN      Take 10 ml (2 teaspoons, 1000 mg) by mouth 2x a day with a meal Patient not taking: Reported on 03/23/2014   300 mL   2    Allergies  Allergen Reactions  . Penicillins Hives   Family History  Problem Relation Age of Onset  . Diabetes Mother   . Colon polyps Mother   . Heart disease Father     CAD  . Diabetes Father    PE: BP 112/68 mmHg  Pulse 88  Temp(Src) 98.7 F (37.1 C) (Oral)  Resp 12  SpO2 99% Wt Readings from Last 3 Encounters:  02/11/14 165 lb (74.844 kg)   Constitutional: overweight, in NAD, in wheelchair. Moves few mm groups, but can talk and eat (see HPI)  Eyes: PERRLA, EOMI, no exophthalmos ENT: moist mucous membranes, no thyromegaly, no cervical lymphadenopathy Cardiovascular: RRR, No MRG Respiratory: CTA B Gastrointestinal: abdomen soft, NT, ND, BS+ Musculoskeletal: tetraparesis Skin: moist, warm, no rashes Neurological: tetraparesis  ASSESSMENT: 1. DM2, insulin-dependent, uncontrolled, without complications  PLAN:  1. Patient with long-standing, uncontrolled diabetes, on oral antidiabetic regimen, which became insufficient. Sugars are very high >> needs basal insulin >> pt agrees. Mom and dad both insulin-dep. Diabetic pts >> he knows about the pens and how to inject. Mom will give him the injections. Patient Instructions  Please continue: - Onglyza 5 mg daily in am - Glipizide XL 5 mg in am  Start Lantus 18 units at bedtime. If sugars in am not <150 in 4-5 days, increase the dose to 22 units.  Please call me about your sugars in 2 weeks.  Please return in 1 month with your sugar log.   Please stop at the lab. - discussed proper inj technique: When injecting insulin:  Inject in the abdomen  Rotate the injection sites around the belly button  Change needle for each injection  Keep needle in for 10  sec after last unit of insulin in - continue checking sugars at different times of the day - check 2 times a day, rotating checks - given more sugar logs  - advised for yearly eye exams - he is UTD - will check HbA1c today - Return to clinic in 1 mo with sugar log   Office  Visit on 03/23/2014  Component Date Value Ref Range Status  . Hgb A1c MFr Bld 03/23/2014 8.6* 4.6 - 6.5 % Final   Glycemic Control Guidelines for People with Diabetes:Non Diabetic:  <6%Goal of Therapy: <7%Additional Action Suggested:  >8%    HbA1c a little better.

## 2014-03-24 ENCOUNTER — Encounter: Payer: Self-pay | Admitting: *Deleted

## 2014-04-06 ENCOUNTER — Telehealth: Payer: Self-pay | Admitting: Internal Medicine

## 2014-04-06 NOTE — Telephone Encounter (Signed)
Called pt's mother and advised her per Dr Charlean SanfilippoGherghe's note. She understood and will call in 1 weeks with sugar readings.

## 2014-04-06 NOTE — Telephone Encounter (Signed)
Yes, continue to add 3 more units every 3 days (target sugars <130 before meals and <180 after meals) and call with sugars in a week.

## 2014-04-06 NOTE — Telephone Encounter (Signed)
Dr. Elvera LennoxGherghe wanted pt to call and let her know how surgars are doing and they are running over 200 everyday for the past two weeks

## 2014-04-06 NOTE — Telephone Encounter (Signed)
Returned call and spoke with pt's mother. Pt's b/s has been over 200 ea time she has checked. 7:30- 8:00 am, 2-3 pm and 7-8 pm. She increased his Lantus to 22 units. Please advise.

## 2014-04-13 ENCOUNTER — Telehealth: Payer: Self-pay | Admitting: Internal Medicine

## 2014-04-13 NOTE — Telephone Encounter (Signed)
Please read message below and advise.  

## 2014-04-13 NOTE — Telephone Encounter (Signed)
Yes, continue to do so.

## 2014-04-13 NOTE — Telephone Encounter (Signed)
Pt did as instructed and increased lantus by 3 u there has not been much change to the blood sugar. It is still high.

## 2014-04-14 NOTE — Telephone Encounter (Signed)
Called and spoke to pt's mother and advised her per Dr Charlean SanfilippoGherghe's note to increase Lantus by 3 units and call back by the end of the week to let us know how his sugar are. She understood.

## 2014-04-17 ENCOUNTER — Telehealth: Payer: Self-pay | Admitting: Internal Medicine

## 2014-04-17 NOTE — Telephone Encounter (Signed)
Please read message below and advise.  

## 2014-04-17 NOTE — Telephone Encounter (Signed)
Mother calling back to tell how pt has been doing on increase lantus  , sugar has been running around 120- 200 today was the 119

## 2014-04-20 NOTE — Telephone Encounter (Signed)
Patient's mother informed

## 2014-04-20 NOTE — Telephone Encounter (Signed)
Improved! Continue same regimen for now. Can increase Lantus by 3 units every 3 days if the sugars are consistently >150 in am and there are no lows.

## 2014-04-23 ENCOUNTER — Encounter: Payer: Self-pay | Admitting: Internal Medicine

## 2014-04-23 ENCOUNTER — Ambulatory Visit (INDEPENDENT_AMBULATORY_CARE_PROVIDER_SITE_OTHER): Payer: Medicare Other | Admitting: Internal Medicine

## 2014-04-23 VITALS — BP 108/78 | HR 97 | Temp 98.6°F | Resp 12

## 2014-04-23 DIAGNOSIS — E119 Type 2 diabetes mellitus without complications: Secondary | ICD-10-CM | POA: Diagnosis not present

## 2014-04-23 MED ORDER — INSULIN GLARGINE 300 UNIT/ML ~~LOC~~ SOPN: 34.0000 [IU] | PEN_INJECTOR | Freq: Every day | SUBCUTANEOUS | Status: DC

## 2014-04-23 MED ORDER — ALBIGLUTIDE 50 MG ~~LOC~~ PEN: PEN_INJECTOR | SUBCUTANEOUS | Status: DC

## 2014-04-23 NOTE — Progress Notes (Signed)
Patient ID: [name], male   DOB: [birth year], 49 y.o.   MRN: [id number]  HPI: [name] is a 49 y.o.-year-old male, referred by his PCP, Dr. Azucena Cecil, for management of DM2, dx in2003, insulin-dependent, uncontrolled, without complications. He has [employer] now.  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 In the last 2 years, his swallowing is more impaired. Mother is primary caregiver.   Hemoglobin A1c levels reviewed per records from PCP and per Epic records: Lab Results  Component Value Date   HGBA1C 8.6* 03/23/2014   HGBA1C 8.9* 12/15/2013   HGBA1C * 05/30/2010    8.3 (NOTE)                                                                       According to the ADA Clinical Practice Recommendations for 2011, when HbA1c is used as a screening test:   >=6.5%   Diagnostic of Diabetes Mellitus           (if abnormal result  is confirmed)  5.7-6.4%   Increased risk of developing Diabetes Mellitus  References:Diagnosis and Classification of Diabetes Mellitus,Diabetes Care,2011,34(Suppl 1):S62-S69 and Standards of Medical Care in         Diabetes - 2011,Diabetes Care,2011,34  (Suppl 1):S11-S61.   12/15/2013: HbA1c 8.9% 06/12/2013: HbA1c 7.8% 12/13/2012: HbA1c 7.5% 12/07/2011: HbA1c 6.9% Lab Results  Component Value Date   HGBA1C 8.3 05/30/2010   Pt is on a regimen of: - Onglyza 5 mg daily - Glipizide XL 5 mg in am - Lantus 18 >> 34 units at bedtime. He tried Metformin >> diarrhea. We tried again Metformin >> diarrhea >> now stopped.   Pt does check his sugars 1-3x a day - much better:  - am:116-185 - before lunch: 205-390 >> 119-152, 186 - 2h after lunch: 210-268 >> n/c - before dinner: 238-315 >> 129 - after dinner: 222-278 >> 204 - bedtime: 197  No lows; he has hypoglycemia awareness at 70.   Pt's meals are: - Breakfast: skips - Lunch: soup, sandwich, burger, diet Mtn dew - Dinner: grilled chicken, veggies, starch, diet  sods - Snacks: PB, cookie  - no CKD, last BUN/creatinine:  06/12/2013: 12/0.20 Lab Results  Component Value Date   BUN 12 12/15/2013   CREATININE 0.2* 12/15/2013   - last set of lipids: 12/15/2013: 146/266/33/60 Lab Results  Component Value Date   CHOL 146 12/15/2013   HDL 33* 12/15/2013   LDLCALC 60 12/15/2013   TRIG 266* 12/15/2013   CHOLHDL 4.2 05/30/2010   - last eye exam was in 12/2013. No DR. - no numbness and tingling in his feet. Has good sensation, but cannot move. He cannot move his legs  ROS: Constitutional: no weight gain/loss, no fatigue, no subjective hyperthermia/hypothermia Eyes: no blurry vision, no xerophthalmia ENT: no sore throat, no nodules palpated in throat, + dysphagia/no odynophagia, no hoarseness Cardiovascular: no CP/SOB/palpitations/leg swelling Respiratory: + cough/no SOB Gastrointestinal: no N/V/D/C/+ acid reflux Musculoskeletal: no muscle/joint aches Skin: no rashes Neurological: no tremors/numbness/tingling/dizziness  I reviewed pt's medications, allergies, PMH, social hx, family hx, and changes were documented in the history of present illness. Otherwise, unchanged from my initial visit note. Past  Medical History  Diagnosis Date  . Diabetes mellitus without complication     DM Type II   . GERD (gastroesophageal reflux disease)   . Hypercholesterolemia   . Muscular dystrophy    No past surgical history on file. History   Social History  . Marital Status: single    Spouse Name: N/A    Number of Children: 0   Occupational History  . disabled   Social History Main Topics  . Smoking status: Former Games developermoker  . Smokeless tobacco: Not on file  . Alcohol Use: No  . Drug Use: No   Current Outpatient Rx  Name  Route  Sig  Dispense  Refill  . esomeprazole (NEXIUM) 40 MG capsule   Oral   Take 40 mg by mouth daily.          Marland Kitchen. gabapentin (NEURONTIN) 250 MG/5ML solution   Oral   Take by mouth 3 (three) times daily.         Marland Kitchen.  glipiZIDE (GLUCOTROL XL) 5 MG 24 hr tablet   Oral   Take 1 tablet (5 mg total) by mouth daily with breakfast.   30 tablet   2     ADVISE PT SMALL PILL WILL BE ABLE TO TAKE THIS.   . glucose blood (ONETOUCH VERIO) test strip      Use to test blood sugar 2 times daily as instructed.   100 each   11   . HYDROcodone-acetaminophen (HYCET) 7.5-325 mg/15 ml solution   Oral   Take 15 mLs by mouth at bedtime.          . Insulin Glargine (LANTUS SOLOSTAR) 100 UNIT/ML Solostar Pen   Subcutaneous   Inject 18 Units into the skin daily at 10 pm.   5 pen   2   . Insulin Pen Needle (CAREFINE PEN NEEDLES) 32G X 4 MM MISC      Use 1x a day   100 each   2   . lisinopril (PRINIVIL,ZESTRIL) 2.5 MG tablet   Oral   Take 2.5 mg by mouth.         . mesalamine (CANASA) 1000 MG suppository   Rectal   Place 1,000 mg rectally.         . Metformin HCl 500 MG/5ML SOLN      Take 10 ml (2 teaspoons, 1000 mg) by mouth 2x a day with a meal   300 mL   2   . ONETOUCH DELICA LANCETS 33G MISC      Use to test blood sugar 2 times daily as instructed.   100 each   11   . rosuvastatin (CRESTOR) 5 MG tablet   Oral   Take 5 mg by mouth.         . saxagliptin HCl (ONGLYZA) 5 MG TABS tablet   Oral   Take 5 mg by mouth.          Allergies  Allergen Reactions  . Penicillins Hives   Family History  Problem Relation Age of Onset  . Diabetes Mother   . Colon polyps Mother   . Heart disease Father     CAD  . Diabetes Father    PE: BP 108/78 mmHg  Pulse 97  Temp(Src) 98.6 F (37 C) (Oral)  Resp 12  SpO2 99% Wt Readings from Last 3 Encounters:  02/11/14 165 lb (74.844 kg)   Constitutional: overweight, in NAD, in wheelchair. Moves few mm groups, but can talk and eat (see  HPI)  Eyes: PERRLA, EOMI, no exophthalmos ENT: moist mucous membranes, no thyromegaly, no cervical lymphadenopathy Cardiovascular: RRR, No MRG Respiratory: CTA B Gastrointestinal: abdomen soft, NT, ND,  BS+ Musculoskeletal: tetraparesis Skin: moist, warm, no rashes Neurological: tetraparesis  ASSESSMENT: 1. DM2, insulin-dependent, uncontrolled, without complications  PLAN:  1. Patient with long-standing, uncontrolled diabetes, on oral antidiabetic regimen, and we started Lantus at last visit with great improvement in his sugars. He still need help with postprandial sugars >> will switch from Onglyza to a weekly GLP1 R agonist: Tanzeum. Will also switch from Lantus to Toujeo. Given coupon for Toujeo. Patient Instructions  Please stop Lantus and start Toujeo 34 units at bedtime.  Start Tanzeum 50 mg on Sundays. Continue Onglyza 5-6 days after starting Tanzeum and then stop.  Continue Glipizide XL 5 mg in am.  Please return in 1.5 months with your sugar log.   - continue checking sugars at different times of the day - check 2 times a day, rotating checks - given more sugar logs  - advised for yearly eye exams - he is UTD - will check HbA1c at next visit - Return to clinic in 2 mo with sugar log

## 2014-04-23 NOTE — Patient Instructions (Addendum)
Please stop Lantus and start Toujeo 34 units at bedtime.  Start Tanzeum 50 mg on Sundays. Continue Onglyza 5-6 days after starting Tanzeum and then stop.  Continue Glipizide XL 5 mg in am.  Please return in 2 months with your sugar log.

## 2014-05-18 ENCOUNTER — Telehealth: Payer: Self-pay | Admitting: Internal Medicine

## 2014-05-18 NOTE — Telephone Encounter (Signed)
Tanzaneum is not approved by his insurance Biduron, trulicity, and victoza are covered please advise

## 2014-05-18 NOTE — Telephone Encounter (Signed)
Oh, good, as Trulicity is easier to use. Let's send 1.5 mg pens #4 with 2 refills.

## 2014-05-18 NOTE — Telephone Encounter (Signed)
Please read message below and advise.  

## 2014-05-19 MED ORDER — DULAGLUTIDE 1.5 MG/0.5ML ~~LOC~~ SOAJ: SUBCUTANEOUS | Status: DC

## 2014-05-19 NOTE — Addendum Note (Signed)
Addended by: Bethann PunchesUCK, MEGAN E on: 05/19/2014 10:45 AM   Modules accepted: Orders

## 2014-05-19 NOTE — Telephone Encounter (Addendum)
Rx sent for Trulicity. Pt's mother notified.

## 2014-05-29 ENCOUNTER — Other Ambulatory Visit: Payer: Self-pay | Admitting: Internal Medicine

## 2014-06-03 ENCOUNTER — Telehealth: Payer: Self-pay | Admitting: Internal Medicine

## 2014-06-03 NOTE — Telephone Encounter (Signed)
Patient need a new prescription of glipizide.  [facility] [address]

## 2014-06-04 ENCOUNTER — Telehealth: Payer: Self-pay | Admitting: Internal Medicine

## 2014-06-04 MED ORDER — GLIPIZIDE ER 5 MG PO TB24: ORAL_TABLET | ORAL | Status: DC

## 2014-06-04 NOTE — Telephone Encounter (Signed)
Called pharmacy. Taken care of.

## 2014-06-04 NOTE — Telephone Encounter (Signed)
[id number] - [place], [place] - [address] [facility] is getting fax for Glipizide refill every morning.

## 2014-06-04 NOTE — Addendum Note (Signed)
Addended by: Adline MangoALLICUTT, Tabetha Haraway B on: 06/04/2014 08:54 AM   Modules accepted: Orders

## 2014-06-18 ENCOUNTER — Telehealth: Payer: Self-pay | Admitting: Internal Medicine

## 2014-06-18 NOTE — Telephone Encounter (Signed)
Returned call to pt's mother. Advised her ok to take it any time that day. She voiced understanding.

## 2014-06-18 NOTE — Telephone Encounter (Signed)
Please read message below and advise.  

## 2014-06-18 NOTE — Telephone Encounter (Signed)
No problem.

## 2014-06-18 NOTE — Telephone Encounter (Signed)
Patients mother called stating that the trulicity has been giving at different times of the day every Sunday   Is this ok? Or does the times given have to be consistent?  Please call and advise    Thank you

## 2014-06-25 ENCOUNTER — Encounter: Payer: Self-pay | Admitting: Internal Medicine

## 2014-06-25 ENCOUNTER — Ambulatory Visit (INDEPENDENT_AMBULATORY_CARE_PROVIDER_SITE_OTHER): Payer: Medicare Other | Admitting: Internal Medicine

## 2014-06-25 ENCOUNTER — Other Ambulatory Visit: Payer: Self-pay | Admitting: Internal Medicine

## 2014-06-25 ENCOUNTER — Ambulatory Visit: Payer: Medicare Other | Admitting: Internal Medicine

## 2014-06-25 VITALS — BP 124/68 | HR 93 | Temp 98.2°F | Resp 12

## 2014-06-25 DIAGNOSIS — K219 Gastro-esophageal reflux disease without esophagitis: Secondary | ICD-10-CM | POA: Diagnosis not present

## 2014-06-25 DIAGNOSIS — E119 Type 2 diabetes mellitus without complications: Secondary | ICD-10-CM

## 2014-06-25 LAB — HEMOGLOBIN A1C: Hgb A1c MFr Bld: 5.8 % (ref 4.6–6.5)

## 2014-06-25 MED ORDER — INSULIN GLARGINE 300 UNIT/ML ~~LOC~~ SOPN: 34.0000 [IU] | PEN_INJECTOR | Freq: Every day | SUBCUTANEOUS | Status: DC

## 2014-06-25 MED ORDER — GLIPIZIDE ER 5 MG PO TB24: ORAL_TABLET | ORAL | Status: DC

## 2014-06-25 MED ORDER — DULAGLUTIDE 1.5 MG/0.5ML ~~LOC~~ SOAJ: SUBCUTANEOUS | Status: DC

## 2014-06-25 MED ORDER — OMEPRAZOLE 40 MG PO CPDR: 40.0000 mg | DELAYED_RELEASE_CAPSULE | Freq: Every day | ORAL | Status: AC

## 2014-06-25 NOTE — Progress Notes (Signed)
Patient ID: [name], male   DOB: 1965/08/05, 49 y.o.   MRN: [id number]  HPI: [name] is a 49 y.o.-year-old male, returning to f/u for DM2, dx in 2003, insulin-dependent, uncontrolled, without complications. He has [employer] now.  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 In the last 2 years, his swallowing is more impaired. Mother is primary caregiver.   Hemoglobin A1c levels reviewed per records from PCP and per Epic records: Lab Results  Component Value Date   HGBA1C 8.6* 03/23/2014   HGBA1C 8.9* 12/15/2013   HGBA1C * 05/30/2010    8.3 (NOTE)                                                                       According to the ADA Clinical Practice Recommendations for 2011, when HbA1c is used as a screening test:   >=6.5%   Diagnostic of Diabetes Mellitus           (if abnormal result  is confirmed)  5.7-6.4%   Increased risk of developing Diabetes Mellitus  References:Diagnosis and Classification of Diabetes Mellitus,Diabetes Care,2011,34(Suppl 1):S62-S69 and Standards of Medical Care in         Diabetes - 2011,Diabetes Care,2011,34  (Suppl 1):S11-S61.   12/15/2013: HbA1c 8.9% 06/12/2013: HbA1c 7.8% 12/13/2012: HbA1c 7.5% 12/07/2011: HbA1c 6.9% Lab Results  Component Value Date   HGBA1C 8.3 05/30/2010   Pt is on a regimen of: - Toujeo 18 >> 34 units at bedtime. - Glipizide XL 5 mg in am - Trulicity 1.5 mg weekly He tried Metformin >> diarrhea. We tried again Metformin >> diarrhea >> now stopped.  He was on Onglyza >> now stopped.  Pt does check his sugars 1-3x a day - much better:  - am:116-185 >> 112, 121 - before lunch: 205-390 >> 119-152, 186 >> 125-137, 162 - 2h after lunch: 210-268 >> n/c >> 160, 162 - before dinner: 238-315 >> 129 >> 117, 162 - after dinner: 222-278 >> 204 >> n/c - bedtime: 197 >> n/c  No lows; he has hypoglycemia awareness at 70.   Pt's meals are: - Breakfast: skips - Lunch: soup,  sandwich, burger, diet Mtn dew - Dinner: grilled chicken, veggies, starch, diet sods - Snacks: PB, cookie  - no CKD, last BUN/creatinine:  Lab Results  Component Value Date   BUN 12 12/15/2013   CREATININE 0.2* 12/15/2013   - last set of lipids: Lab Results  Component Value Date   CHOL 146 12/15/2013   HDL 33* 12/15/2013   LDLCALC 60 12/15/2013   TRIG 266* 12/15/2013   CHOLHDL 4.2 05/30/2010   - last eye exam was in 12/2013. No DR. - no numbness and tingling in his feet. Has good sensation, but cannot move. He cannot move his legs  ROS: Constitutional: no weight gain/loss, no fatigue, no subjective hyperthermia/hypothermia, + excessive urination Eyes: no blurry vision, no xerophthalmia ENT: no sore throat, no nodules palpated in throat, + dysphagia/no odynophagia, no hoarseness Cardiovascular: no CP/+ SOB/no palpitations/leg swelling Respiratory: + cough/+ SOB Gastrointestinal: no N/V/D/C/+ acid reflux Musculoskeletal: no muscle/joint aches Skin: no rashes Neurological: no tremors/numbness/tingling/dizziness  I reviewed pt's medications, allergies, PMH, social hx,  family hx, and changes were documented in the history of present illness. Otherwise, unchanged from my initial visit note. Past Medical History  Diagnosis Date  . Diabetes mellitus without complication     DM Type II   . GERD (gastroesophageal reflux disease)   . Hypercholesterolemia   . Muscular dystrophy    No past surgical history on file. History   Social History  . Marital Status: single    Spouse Name: N/A    Number of Children: 0   Occupational History  . disabled   Social History Main Topics  . Smoking status: Former Games developer  . Smokeless tobacco: Not on file  . Alcohol Use: No  . Drug Use: No   Current Outpatient Rx  Name  Route  Sig  Dispense  Refill  . Albiglutide (TANZEUM) 50 MG PEN      Inject 50 mg under skin on Sundays   4 each   2   . Dulaglutide (TRULICITY) 1.5 MG/0.5ML  SOPN      Inject 1.5 mg into the skin weekly.   4 pen   2   . esomeprazole (NEXIUM) 40 MG capsule   Oral   Take 40 mg by mouth daily.          Marland Kitchen gabapentin (NEURONTIN) 250 MG/5ML solution   Oral   Take by mouth 3 (three) times daily.         Marland Kitchen glipiZIDE (GLUCOTROL XL) 5 MG 24 hr tablet      TAKE ONE TABLET BY MOUTH EVERY MORNING WITH BREAKFAST   30 tablet   2   . glucose blood (ONETOUCH VERIO) test strip      Use to test blood sugar 2 times daily as instructed.   100 each   11   . HYDROcodone-acetaminophen (HYCET) 7.5-325 mg/15 ml solution   Oral   Take 15 mLs by mouth at bedtime.          . Insulin Glargine (TOUJEO SOLOSTAR) 300 UNIT/ML SOPN   Subcutaneous   Inject 34 Units into the skin at bedtime.   3 pen   2   . Insulin Pen Needle (CAREFINE PEN NEEDLES) 32G X 4 MM MISC      Use 1x a day   100 each   2   . lisinopril (PRINIVIL,ZESTRIL) 2.5 MG tablet   Oral   Take 2.5 mg by mouth.         . mesalamine (CANASA) 1000 MG suppository   Rectal   Place 1,000 mg rectally.         . Metformin HCl 500 MG/5ML SOLN      Take 10 ml (2 teaspoons, 1000 mg) by mouth 2x a day with a meal   300 mL   2   . ONETOUCH DELICA LANCETS 33G MISC      Use to test blood sugar 2 times daily as instructed.   100 each   11   . rosuvastatin (CRESTOR) 5 MG tablet   Oral   Take 5 mg by mouth.          Allergies  Allergen Reactions  . Penicillins Hives   Family History  Problem Relation Age of Onset  . Diabetes Mother   . Colon polyps Mother   . Heart disease Father     CAD  . Diabetes Father    PE: BP 124/68 mmHg  Pulse 93  Temp(Src) 98.2 F (36.8 C) (Oral)  Resp 12  SpO2 98% Wt  Readings from Last 3 Encounters:  02/11/14 165 lb (74.844 kg)   Constitutional: overweight, in NAD, in wheelchair. Moves few mm groups, but can talk and eat (see HPI)  Eyes: PERRLA, EOMI, no exophthalmos ENT: moist mucous membranes, no thyromegaly, no cervical  lymphadenopathy Cardiovascular: RRR, No MRG Respiratory: CTA B Gastrointestinal: abdomen soft, NT, ND, BS+ Musculoskeletal: tetraparesis Skin: moist, warm, no rashes Neurological: tetraparesis  ASSESSMENT: 1. DM2, insulin-dependent, uncontrolled, without complications  2. GERD  PLAN:  1. Patient with long-standing, uncontrolled diabetes, on oral antidiabetic regimen + Lantus + weekly GLP1 R agonist. Sugars much improved >> will continue current regimen. Patient Instructions  Please continue: - Toujeo 34 units at bedtime. - Glipizide XL 5 mg in am - Trulicity 1.5 mg weekly  Please stop at the lab.  Please return in 3 months with your sugar log.   - continue checking sugars at different times of the day - check 2 times a day, rotating checks - given more sugar logs  - advised for yearly eye exams - he is UTD - will check HbA1c today - refilled Rx's for DM - Return to clinic in 3 mo with sugar log   2. GERD - has more acid reflux lately, possibly 2/2 GLP1 R agonist (Trulicity) >> will give a 2 week course of PPI >> sent Omeprazole to his phamacy  Office Visit on 06/25/2014  Component Date Value Ref Range Status  . Hgb A1c MFr Bld 06/25/2014 5.8  4.6 - 6.5 % Final   Glycemic Control Guidelines for People with Diabetes:Non Diabetic:  <6%Goal of Therapy: <7%Additional Action Suggested:  >8%    Excellent improvement in hemoglobin A1c! At next visits, we may need to stop the glipizide and decrease the toujeo.

## 2014-06-25 NOTE — Patient Instructions (Signed)
Please continue: - Toujeo 34 units at bedtime. - Glipizide XL 5 mg in am - Trulicity 1.5 mg weekly  Please stop at the lab.  Please return in 3 months with your sugar log.

## 2014-07-07 ENCOUNTER — Ambulatory Visit: Payer: Medicare Other | Admitting: Internal Medicine

## 2014-09-25 ENCOUNTER — Encounter: Payer: Self-pay | Admitting: Internal Medicine

## 2014-09-25 ENCOUNTER — Ambulatory Visit (INDEPENDENT_AMBULATORY_CARE_PROVIDER_SITE_OTHER): Payer: Medicare Other | Admitting: Internal Medicine

## 2014-09-25 ENCOUNTER — Other Ambulatory Visit (INDEPENDENT_AMBULATORY_CARE_PROVIDER_SITE_OTHER): Payer: Medicare Other | Admitting: *Deleted

## 2014-09-25 VITALS — BP 104/62 | HR 96 | Temp 98.3°F | Resp 12

## 2014-09-25 DIAGNOSIS — K219 Gastro-esophageal reflux disease without esophagitis: Secondary | ICD-10-CM

## 2014-09-25 DIAGNOSIS — E119 Type 2 diabetes mellitus without complications: Secondary | ICD-10-CM

## 2014-09-25 LAB — POCT GLYCOSYLATED HEMOGLOBIN (HGB A1C): Hemoglobin A1C: 6.8

## 2014-09-25 NOTE — Patient Instructions (Addendum)
Please continue: - Toujeo 34 units at bedtime. - Glipizide XL 5 mg in am - Trulicity 1.5 mg weekly  Please return in 3 months with your sugar log.   Try Zantac for Acid Reflux.

## 2014-09-25 NOTE — Progress Notes (Signed)
Patient ID: [name], male   DOB: February 06, 1965, 49 y.o.   MRN: [id number]  HPI: [name] is a 49 y.o.-year-old male, returning to f/u for DM2, dx in 2003, insulin-dependent, uncontrolled, without complications. Last visit 3 mo ago. He has [employer].  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 In the last 2 years, his swallowing is more impaired. Mother is primary caregiver.   Hemoglobin A1c levels reviewed per records from PCP and per Epic records: Lab Results  Component Value Date   HGBA1C 5.8 06/25/2014   HGBA1C 8.6* 03/23/2014   HGBA1C 8.9* 12/15/2013   12/15/2013: HbA1c 8.9% 06/12/2013: HbA1c 7.8% 12/13/2012: HbA1c 7.5% 12/07/2011: HbA1c 6.9% 05/30/2010: HbA1c 8.3%  Pt is on a regimen of: - Toujeo 34 units at bedtime. - Glipizide XL 5 mg in am - Trulicity 1.5 mg weekly He tried Metformin >> diarrhea. We tried again Metformin >> diarrhea >> now stopped.  He was on Onglyza >> now stopped.  Pt does check his sugars 1-3x a day - higher - mother was in the hospital and had cellulitis and vertigo: - am:116-185 >> 112, 121 >> 117, 177, 182 - before lunch: 205-390 >> 119-152, 186 >> 125-192, 267 - 2h after lunch: 210-268 >> n/c >> 160, 162 >> 125, 230 - before dinner: 238-315 >> 129 >> 117, 162 >> 121, 203 - after dinner: 222-278 >> 204 >> n/c >> 112-137 - bedtime: 197 >> n/c  No lows; he has hypoglycemia awareness at 70.   Pt's meals are: - Breakfast: skips - Lunch: soup, sandwich, burger, diet Mtn dew - Dinner: grilled chicken, veggies, starch, diet sods - Snacks: PB, cookie  - no CKD, last BUN/creatinine:  Lab Results  Component Value Date   BUN 12 12/15/2013   CREATININE 0.2* 12/15/2013   - last set of lipids: Lab Results  Component Value Date   CHOL 146 12/15/2013   HDL 33* 12/15/2013   LDLCALC 60 12/15/2013   TRIG 266* 12/15/2013   CHOLHDL 4.2 05/30/2010   - last eye exam was in 12/2013. No DR. - no  numbness and tingling in his feet. Has good sensation, but cannot move. He cannot move his legs  ROS: Constitutional: no weight gain/loss, no fatigue, no subjective hyperthermia/hypothermia, + excessive urination Eyes: no blurry vision, no xerophthalmia ENT: no sore throat, no nodules palpated in throat, + dysphagia/no odynophagia, no hoarseness Cardiovascular: no CP/SOB/no palpitations/leg swelling Respiratory: + cough/SOB Gastrointestinal: no N/V/D/C/+ acid reflux Musculoskeletal: no muscle/joint aches Skin: no rashes Neurological: no tremors/numbness/tingling/dizziness  I reviewed pt's medications, allergies, PMH, social hx, family hx, and changes were documented in the history of present illness. Otherwise, unchanged from my initial visit note. Past Medical History  Diagnosis Date  . Diabetes mellitus without complication     DM Type II   . GERD (gastroesophageal reflux disease)   . Hypercholesterolemia   . Muscular dystrophy    No past surgical history on file. History   Social History  . Marital Status: single    Spouse Name: N/A    Number of Children: 0   Occupational History  . disabled   Social History Main Topics  . Smoking status: Former Games developer  . Smokeless tobacco: Not on file  . Alcohol Use: No  . Drug Use: No   Current Outpatient Rx  Name  Route  Sig  Dispense  Refill  . Dulaglutide (TRULICITY) 1.5 MG/0.5ML SOPN  Inject 1.5 mg into the skin weekly.   12 pen   1   . gabapentin (NEURONTIN) 250 MG/5ML solution   Oral   Take by mouth 3 (three) times daily.         Marland Kitchen glipiZIDE (GLUCOTROL XL) 5 MG 24 hr tablet      TAKE ONE TABLET BY MOUTH EVERY MORNING WITH BREAKFAST   90 tablet   1   . glucose blood (ONETOUCH VERIO) test strip      Use to test blood sugar 2 times daily as instructed.   100 each   11   . HYDROcodone-acetaminophen (HYCET) 7.5-325 mg/15 ml solution   Oral   Take 15 mLs by mouth at bedtime.          . Insulin Glargine  (TOUJEO SOLOSTAR) 300 UNIT/ML SOPN   Subcutaneous   Inject 34 Units into the skin at bedtime.   9 pen   1   . Insulin Pen Needle (CAREFINE PEN NEEDLES) 32G X 4 MM MISC      Use 1x a day   100 each   2   . lisinopril (PRINIVIL,ZESTRIL) 2.5 MG tablet   Oral   Take 2.5 mg by mouth.         . mesalamine (CANASA) 1000 MG suppository   Rectal   Place 1,000 mg rectally.         . Metformin HCl 500 MG/5ML SOLN      Take 10 ml (2 teaspoons, 1000 mg) by mouth 2x a day with a meal   300 mL   2   . omeprazole (PRILOSEC) 40 MG capsule   Oral   Take 1 capsule (40 mg total) by mouth daily.   14 capsule   1   . ONETOUCH DELICA LANCETS 33G MISC      Use to test blood sugar 2 times daily as instructed.   100 each   11   . rosuvastatin (CRESTOR) 5 MG tablet   Oral   Take 5 mg by mouth.          Allergies  Allergen Reactions  . Penicillins Hives   Family History  Problem Relation Age of Onset  . Diabetes Mother   . Colon polyps Mother   . Heart disease Father     CAD  . Diabetes Father    PE: BP 104/62 mmHg  Pulse 96  Temp(Src) 98.3 F (36.8 C) (Oral)  Resp 12  SpO2 97% Wt Readings from Last 3 Encounters:  02/11/14 165 lb (74.844 kg)   Constitutional: overweight, in NAD, in wheelchair. Moves few mm groups, but can talk and eat (see HPI)  Eyes: PERRLA, EOMI, no exophthalmos ENT: moist mucous membranes, no thyromegaly, no cervical lymphadenopathy Cardiovascular: RRR, No MRG Respiratory: CTA B Gastrointestinal: abdomen soft, NT, ND, BS+ Musculoskeletal: tetraparesis Skin: moist, warm, no rashes Neurological: tetraparesis  ASSESSMENT: 1. DM2, insulin-dependent, uncontrolled, without complications  2. GERD  PLAN:  1. Patient with long-standing, uncontrolled diabetes, on oral antidiabetic regimen + Lantus + weekly GLP1 R agonist. Sugars higher as he had irregular feeling times and missed some med doses. Now that mother is feeling better >> will get back to  previous schedule >>  will continue current regimen. Patient Instructions  Please continue: - Toujeo 34 units at bedtime. - Glipizide XL 5 mg in am - Trulicity 1.5 mg weekly  Please return in 3 months with your sugar log.   Try Zantac for Acid Reflux.  -  continue checking sugars at different times of the day - check 2 times a day, rotating checks - given more sugar logs  - advised for yearly eye exams - he is UTD - will check HbA1c today >> 6.8% (higher) - Return to clinic in 3 mo with sugar log   2. GERD - I Rx'ed Nexium at last visit, but he could not crush them - suggested Zantac

## 2014-10-20 ENCOUNTER — Other Ambulatory Visit: Payer: Self-pay | Admitting: Internal Medicine

## 2014-12-06 ENCOUNTER — Other Ambulatory Visit: Payer: Self-pay | Admitting: Internal Medicine

## 2014-12-08 ENCOUNTER — Other Ambulatory Visit: Payer: Self-pay | Admitting: Internal Medicine

## 2014-12-28 ENCOUNTER — Ambulatory Visit (INDEPENDENT_AMBULATORY_CARE_PROVIDER_SITE_OTHER): Payer: Medicare Other | Admitting: Internal Medicine

## 2014-12-28 ENCOUNTER — Encounter: Payer: Self-pay | Admitting: Internal Medicine

## 2014-12-28 ENCOUNTER — Other Ambulatory Visit (INDEPENDENT_AMBULATORY_CARE_PROVIDER_SITE_OTHER): Payer: Medicare Other | Admitting: *Deleted

## 2014-12-28 VITALS — BP 118/68 | HR 100 | Temp 98.7°F | Resp 14

## 2014-12-28 DIAGNOSIS — E119 Type 2 diabetes mellitus without complications: Secondary | ICD-10-CM

## 2014-12-28 LAB — POCT GLYCOSYLATED HEMOGLOBIN (HGB A1C): Hemoglobin A1C: 6.9

## 2014-12-28 MED ORDER — GLIPIZIDE 5 MG PO TABS: 5.0000 mg | ORAL_TABLET | Freq: Every day | ORAL | Status: DC

## 2014-12-28 NOTE — Patient Instructions (Signed)
Please continue: - Toujeo 34 units at bedtime. - Glipizide XL 5 mg, but move this to before breakfast - Trulicity 1.5 mg weekly  Please add a Glipizide instant release 5 mg before dinner - try to cut this in half at the beginning to see if this helps.  Please return in 3 months with your sugar log.

## 2014-12-28 NOTE — Progress Notes (Signed)
Patient ID: [name], male   DOB: 11-21-1965, 49 y.o.   MRN: [id number]  HPI: [name] Robertaul A Fernando is a 49 y.o.-year-old male, returning to f/u for DM2, dx in 2003, insulin-dependent, uncontrolled, without complications. Last visit 3 mo ago. He has [employer] insurance.  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 In the last 2 years, his swallowing is more impaired. Mother is primary caregiver.   Hemoglobin A1c levels reviewed per records from PCP and per Epic records: Lab Results  Component Value Date   HGBA1C 6.8 09/25/2014   HGBA1C 5.8 06/25/2014   HGBA1C 8.6* 03/23/2014   12/15/2013: HbA1c 8.9% 06/12/2013: HbA1c 7.8% 12/13/2012: HbA1c 7.5% 12/07/2011: HbA1c 6.9% 05/30/2010: HbA1c 8.3%  Pt is on a regimen of: - Toujeo 34 units at bedtime. - Glipizide XL 5 mg at bedtime!! - Trulicity 1.5 mg weekly He tried Metformin >> diarrhea. We tried again Metformin >> diarrhea >> now stopped.  He was on Onglyza >> now stopped.  Pt does check his sugars 1-3x a day: - am:116-185 >> 112, 121 >> 117, 177, 182 >> 138-177 - 2h after b'fast: 146-183 - before lunch: 205-390 >> 119-152, 186 >> 125-192, 267 >> 126, 129 - 2h after lunch: 210-268 >> n/c >> 160, 162 >> 125, 230 >> 119, 221 - before dinner: 238-315 >> 129 >> 117, 162 >> 121, 203 >> 119-129 - after dinner: 222-278 >> 204 >> n/c >> 112-137 >> 126, 138, 214-243 - bedtime: 197 >> n/c  No lows; he has hypoglycemia awareness at 70.   Pt's meals are: - Breakfast: skips - Lunch: soup, sandwich, burger, diet Mtn dew - Dinner: grilled chicken, veggies, starch, diet sods - Snacks: PB, cookie  - no CKD, last BUN/creatinine:  Lab Results  Component Value Date   BUN 12 12/15/2013   CREATININE 0.2* 12/15/2013   - last set of lipids: Lab Results  Component Value Date   CHOL 146 12/15/2013   HDL 33* 12/15/2013   LDLCALC 60 12/15/2013   TRIG 266* 12/15/2013   CHOLHDL 4.2 05/30/2010   - last eye  exam was in 12/2013. No DR. - no numbness and tingling in his feet. Has good sensation, but cannot move. He cannot move his legs  ROS: Constitutional: no weight gain/loss, no fatigue, no subjective hyperthermia/hypothermia, + excessive urination Eyes: no blurry vision, no xerophthalmia ENT: no sore throat, no nodules palpated in throat, + dysphagia/no odynophagia, no hoarseness Cardiovascular: no CP/SOB/no palpitations/leg swelling Respiratory: + cough/SOB Gastrointestinal: no N/V/D/C/+ acid reflux Musculoskeletal: no muscle/joint aches Skin: no rashes Neurological: no tremors/numbness/tingling/dizziness  I reviewed pt's medications, allergies, PMH, social hx, family hx, and changes were documented in the history of present illness. Otherwise, unchanged from my initial visit note. Past Medical History  Diagnosis Date  . Diabetes mellitus without complication (HCC)     DM Type II   . GERD (gastroesophageal reflux disease)   . Hypercholesterolemia   . Muscular dystrophy (HCC)    No past surgical history on file. History   Social History  . Marital Status: single    Spouse Name: N/A    Number of Children: 0   Occupational History  . disabled   Social History Main Topics  . Smoking status: Former Games developermoker  . Smokeless tobacco: Not on file  . Alcohol Use: No  . Drug Use: No   Current Outpatient Rx  Name  Route  Sig  Dispense  Refill  .  gabapentin (NEURONTIN) 250 MG/5ML solution   Oral   Take by mouth 3 (three) times daily.         Marland Kitchen glipiZIDE (GLUCOTROL XL) 5 MG 24 hr tablet      TAKE ONE TABLET BY MOUTH EVERY MORNING WITH BREAKFAST   90 tablet   1   . glucose blood (ONETOUCH VERIO) test strip      Use to test blood sugar 2 times daily as instructed.   100 each   11   . HYDROcodone-acetaminophen (HYCET) 7.5-325 mg/15 ml solution   Oral   Take 15 mLs by mouth at bedtime.          . Insulin Glargine (TOUJEO SOLOSTAR) 300 UNIT/ML SOPN   Subcutaneous   Inject  34 Units into the skin at bedtime.   9 pen   1   . Insulin Pen Needle (CAREFINE PEN NEEDLES) 32G X 4 MM MISC      Use 1x a day   100 each   2   . lisinopril (PRINIVIL,ZESTRIL) 2.5 MG tablet   Oral   Take 2.5 mg by mouth.         Marland Kitchen omeprazole (PRILOSEC) 40 MG capsule   Oral   Take 1 capsule (40 mg total) by mouth daily.   14 capsule   1   . ONETOUCH DELICA LANCETS 33G MISC      Use to test blood sugar 2 times daily as instructed.   100 each   11   . rosuvastatin (CRESTOR) 5 MG tablet   Oral   Take 5 mg by mouth.         . TRULICITY 1.5 MG/0.5ML SOPN      INJECT 1.5 MG INTO THE SKIN WEEKLY.   6 pen   1   . EXPIRED: mesalamine (CANASA) 1000 MG suppository   Rectal   Place 1,000 mg rectally.          Allergies  Allergen Reactions  . Penicillins Hives   Family History  Problem Relation Age of Onset  . Diabetes Mother   . Colon polyps Mother   . Heart disease Father     CAD  . Diabetes Father    PE: BP 118/68 mmHg  Pulse 100  Temp(Src) 98.7 F (37.1 C) (Oral)  Resp 14  SpO2 97% There is no height or weight on file to calculate BMI. Wt Readings from Last 3 Encounters:  02/11/14 165 lb (74.844 kg)   Constitutional: overweight, in NAD, in wheelchair. Moves few mm groups, but can talk and eat (see HPI)  Eyes: PERRLA, EOMI, no exophthalmos ENT: moist mucous membranes, no thyromegaly, no cervical lymphadenopathy Cardiovascular: RRR, No MRG Respiratory: CTA B Gastrointestinal: abdomen soft, NT, ND, BS+ Musculoskeletal: tetraparesis Skin: moist, warm, no rashes Neurological: tetraparesis  ASSESSMENT: 1. DM2, insulin-dependent, uncontrolled, without complications  2. GERD  PLAN:  1. Patient with long-standing, uncontrolled diabetes, on oral antidiabetic regimen + Lantus + weekly GLP1 R agonist. Sugars were higher at last visit as he had irregular feeding times and missed some med doses. HbA1c was also higher, at 6.8%. At this visit, the sugars are  still high after meals, especially dinner, but he is taking the Glipizide ER at bedtime >> will move this in am. Will also add Glipizide before dinner. Patient Instructions  Please continue: - Toujeo 34 units at bedtime. - Glipizide XL 5 mg, but move this to before breakfast - Trulicity 1.5 mg weekly  Please add a Glipizide  instant release 5 mg before dinner - try to cut this in half at the beginning to see if this helps.  Please return in 3 months with your sugar log.   - continue checking sugars at different times of the day - check 2 times a day, rotating checks - given more sugar logs  - advised for yearly eye exams - he is UTD - will check HbA1c today >> 6.9% - Return to clinic in 3 mo with sugar log

## 2015-01-29 ENCOUNTER — Emergency Department (HOSPITAL_COMMUNITY)
Admission: EM | Admit: 2015-01-29 | Discharge: 2015-01-30 | Disposition: A | Discharge status: Home / Self Care | Payer: Medicare Other | Attending: Emergency Medicine | Admitting: Emergency Medicine

## 2015-01-29 ENCOUNTER — Encounter (HOSPITAL_COMMUNITY): Payer: Self-pay | Admitting: Oncology

## 2015-01-29 DIAGNOSIS — Y9289 Other specified places as the place of occurrence of the external cause: Secondary | ICD-10-CM | POA: Insufficient documentation

## 2015-01-29 DIAGNOSIS — Z794 Long term (current) use of insulin: Secondary | ICD-10-CM | POA: Insufficient documentation

## 2015-01-29 DIAGNOSIS — Z880 Allergy status to penicillin: Secondary | ICD-10-CM | POA: Insufficient documentation

## 2015-01-29 DIAGNOSIS — R1310 Dysphagia, unspecified: Secondary | ICD-10-CM | POA: Diagnosis present

## 2015-01-29 DIAGNOSIS — Z79899 Other long term (current) drug therapy: Secondary | ICD-10-CM | POA: Insufficient documentation

## 2015-01-29 DIAGNOSIS — E7800 Pure hypercholesterolemia, unspecified: Secondary | ICD-10-CM | POA: Insufficient documentation

## 2015-01-29 DIAGNOSIS — Y998 Other external cause status: Secondary | ICD-10-CM | POA: Diagnosis not present

## 2015-01-29 DIAGNOSIS — T18128A Food in esophagus causing other injury, initial encounter: Secondary | ICD-10-CM | POA: Diagnosis not present

## 2015-01-29 DIAGNOSIS — K5900 Constipation, unspecified: Secondary | ICD-10-CM | POA: Insufficient documentation

## 2015-01-29 DIAGNOSIS — Y9389 Activity, other specified: Secondary | ICD-10-CM | POA: Diagnosis not present

## 2015-01-29 DIAGNOSIS — Z7984 Long term (current) use of oral hypoglycemic drugs: Secondary | ICD-10-CM | POA: Diagnosis not present

## 2015-01-29 DIAGNOSIS — K219 Gastro-esophageal reflux disease without esophagitis: Secondary | ICD-10-CM | POA: Insufficient documentation

## 2015-01-29 DIAGNOSIS — X58XXXA Exposure to other specified factors, initial encounter: Secondary | ICD-10-CM | POA: Insufficient documentation

## 2015-01-29 DIAGNOSIS — T18108A Unspecified foreign body in esophagus causing other injury, initial encounter: Secondary | ICD-10-CM

## 2015-01-29 DIAGNOSIS — Z87891 Personal history of nicotine dependence: Secondary | ICD-10-CM | POA: Insufficient documentation

## 2015-01-29 DIAGNOSIS — E119 Type 2 diabetes mellitus without complications: Secondary | ICD-10-CM | POA: Diagnosis not present

## 2015-01-29 DIAGNOSIS — Z8669 Personal history of other diseases of the nervous system and sense organs: Secondary | ICD-10-CM | POA: Insufficient documentation

## 2015-01-29 NOTE — ED Notes (Signed)
Pt reports the feeling of having something stuck in his throat since last night when he ate a cheeseburger.  Pt states he has coughed and little particles have come up.  Pt is speaking in full sentences in NAD.

## 2015-01-29 NOTE — ED Provider Notes (Signed)
CSN: [id number]     Arrival date & time 01/29/15  2042 History   By signing my name below, I, [name], attest that this documentation has been prepared under the direction and in the presence of [name], MD.  Electronically Signed: Arlan Best, ED Scribe. 01/29/2015. 12:37 AM.   Chief Complaint  Patient presents with  . Dysphagia   Patient is a 49 y.o. male presenting with foreign body swallowed. The history is provided by the patient. No language interpreter was used.  Swallowed Foreign Body This is a recurrent problem. The current episode started 2 days ago. The problem occurs constantly. The problem has not changed since onset.Pertinent negatives include no chest pain, no abdominal pain, no headaches and no shortness of breath. Nothing aggravates the symptoms. Nothing relieves the symptoms. He has tried nothing for the symptoms. The treatment provided moderate relief.    HPI Comments: [name] is a 49 y.o. male with a PMHx of DM and GERD who presents to the Emergency Department complaining of constant, ongoing dysphagia x 3 day. Pt states he ate a sausage patty on 12/28 which caused some burning and discomfort in his throat. Pt then ate a cheeseburger at approximately 9:00 PM last night and felt as though something was lodged in his throat after waking up this morning. Pt states the feeling is similar to acid reflux and admits to going to bed approximately 2 hours after eating last night. He now c/o intermittent coughing consisting of "small chunks of food" and intermittent constipation. He is able to tolerate liquids since time of onset. Pt admits to a history of "trouble swallowing". He states he has been evaluated in the past for this with extensive testing. He is currently prescribed Prilosec for history of GERD but currently does not have an available prescription.  PCP: [name], MD    Past Medical History  Diagnosis Date  . Diabetes mellitus without  complication (HCC)     DM Type II   . GERD (gastroesophageal reflux disease)   . Hypercholesterolemia   . Muscular dystrophy (HCC)    History reviewed. No pertinent past surgical history. Family History  Problem Relation Age of Onset  . Diabetes Mother   . Colon polyps Mother   . Heart disease Father     CAD  . Diabetes Father    Social History  Substance Use Topics  . Smoking status: Former Games developer  . Smokeless tobacco: Never Used  . Alcohol Use: No    Review of Systems  Constitutional: Negative for fever and chills.  Respiratory: Positive for cough. Negative for chest tightness and shortness of breath.   Cardiovascular: Negative for chest pain.  Gastrointestinal: Positive for constipation. Negative for nausea, vomiting and abdominal pain.  Musculoskeletal: Negative for back pain.  Skin: Negative for rash.  Neurological: Negative for headaches.  Psychiatric/Behavioral: Negative for confusion.  All other systems reviewed and are negative.     Allergies  Penicillins  Home Medications   Prior to Admission medications   Medication Sig Start Date End Date Taking? Authorizing Provider  CANASA 1000 MG suppository Place 1,000 mg rectally at bedtime as needed (for ulcerative proctitis).  12/10/14  Yes Historical Provider, MD  glipiZIDE (GLUCOTROL XL) 5 MG 24 hr tablet TAKE ONE TABLET BY MOUTH EVERY MORNING WITH BREAKFAST 10/20/14  Yes Carlus Pavlov, MD  glipiZIDE (GLUCOTROL) 5 MG tablet Take 1 tablet (5 mg total) by mouth daily before supper. 12/28/14  Yes Carlus Pavlov, MD  glucose blood (ONETOUCH VERIO) test strip Use to test blood sugar 2 times daily as instructed. 02/11/14  Yes Carlus Pavlovristina Gherghe, MD  HYDROcodone-acetaminophen (HYCET) 7.5-325 mg/15 ml solution Take 15 mLs by mouth at bedtime.    Yes Historical Provider, MD  Insulin Glargine (TOUJEO SOLOSTAR) 300 UNIT/ML SOPN Inject 34 Units into the skin at bedtime. 06/25/14  Yes Carlus Pavlovristina Gherghe, MD  Insulin Pen Needle  (CAREFINE PEN NEEDLES) 32G X 4 MM MISC Use 1x a day 03/23/14  Yes Carlus Pavlovristina Gherghe, MD  lisinopril (PRINIVIL,ZESTRIL) 2.5 MG tablet Take 2.5 mg by mouth.   Yes Historical Provider, MD  [facility] 33G MISC Use to test blood sugar 2 times daily as instructed. 02/11/14  Yes Carlus Pavlovristina Gherghe, MD  rosuvastatin (CRESTOR) 5 MG tablet Take 5 mg by mouth.   Yes Historical Provider, MD  TRULICITY 1.5 MG/0.5ML SOPN INJECT 1.5 MG INTO THE SKIN WEEKLY. 12/07/14  Yes Carlus Pavlovristina Gherghe, MD  omeprazole (PRILOSEC) 40 MG capsule Take 1 capsule (40 mg total) by mouth daily. Patient not taking: Reported on 01/29/2015 06/25/14   Carlus Pavlovristina Gherghe, MD   Triage Vitals: BP 167/92 mmHg  Pulse 82  Temp(Src) 98.1 F (36.7 C) (Oral)  Resp 20  SpO2 100%   Physical Exam  Constitutional: He is oriented to person, place, and time. He appears well-developed and well-nourished.  HENT:  Head: Normocephalic.  Mouth/Throat: Oropharynx is clear and moist. No oropharyngeal exudate.  No residual foreign bodies   Eyes: Conjunctivae and EOM are normal. Pupils are equal, round, and reactive to light.  Neck: Normal range of motion.  Cardiovascular: Normal rate and regular rhythm.   No carotid bruits   Pulmonary/Chest: Effort normal. No stridor. No respiratory distress. He has no wheezes. He has no rales.  Abdominal: Soft. He exhibits no distension and no mass. There is no tenderness. There is no rebound and no guarding.  Mildly hyperactive bowel sounds   Musculoskeletal: Normal range of motion.  Neurological: He is alert and oriented to person, place, and time.  Skin: Skin is warm and dry.  Psychiatric: He has a normal mood and affect.  Nursing note and vitals reviewed.   ED Course  Procedures (including critical care time)  DIAGNOSTIC STUDIES: Oxygen Saturation is 100% on RA, Normal by my interpretation.    COORDINATION OF CARE: 12:31 AM- Will order DG neck soft tissue and CXR. Discussed treatment plan with pt  at bedside and pt agreed to plan.     2:41 AM- Will start on Carafate and follow up with GI. Pt okay to stop Carafate once he has been cleared by GI.   Labs Review Labs Reviewed - No data to display  Imaging Review No results found. I have personally reviewed and evaluated these images and lab results as part of my medical decision-making.   EKG Interpretation None      MDM   Final diagnoses:  None    Tolerating PO in the department.  Will start carafate for GERD.  Will start soft diet and have patient follow up with GI for EGD  I personally performed the services described in this documentation, which was scribed in my presence. The recorded information has been reviewed and is accurate.     Cy BlamerApril Shaqueta Casady, MD 01/30/15 [phone]

## 2015-01-30 ENCOUNTER — Encounter (HOSPITAL_COMMUNITY): Payer: Self-pay | Admitting: Emergency Medicine

## 2015-01-30 ENCOUNTER — Emergency Department (HOSPITAL_COMMUNITY): Payer: Medicare Other

## 2015-01-30 MED ORDER — SUCRALFATE 1 GM/10ML PO SUSP: 1.0000 g | Freq: Three times a day (TID) | ORAL | Status: DC

## 2015-01-30 MED ORDER — ALUM & MAG HYDROXIDE-SIMETH 200-200-20 MG/5ML PO SUSP
30.0000 mL | Freq: Once | ORAL | Status: AC
  Administered 2015-01-30: 30 mL via ORAL
  Filled 2015-01-30: qty 30

## 2015-02-24 ENCOUNTER — Encounter: Payer: Self-pay | Admitting: Internal Medicine

## 2015-02-24 NOTE — Progress Notes (Signed)
Received hemoglobin A1c level from 02/23/2015: 6.3%.

## 2015-03-30 ENCOUNTER — Encounter: Payer: Self-pay | Admitting: Internal Medicine

## 2015-03-30 ENCOUNTER — Ambulatory Visit (INDEPENDENT_AMBULATORY_CARE_PROVIDER_SITE_OTHER): Payer: Medicare Other | Admitting: Internal Medicine

## 2015-03-30 VITALS — BP 122/88 | HR 96 | Temp 98.2°F | Resp 12

## 2015-03-30 DIAGNOSIS — E119 Type 2 diabetes mellitus without complications: Secondary | ICD-10-CM

## 2015-03-30 DIAGNOSIS — L89229 Pressure ulcer of left hip, unspecified stage: Secondary | ICD-10-CM | POA: Diagnosis not present

## 2015-03-30 MED ORDER — INSULIN GLARGINE 300 UNIT/ML ~~LOC~~ SOPN: 34.0000 [IU] | PEN_INJECTOR | Freq: Every day | SUBCUTANEOUS | Status: DC

## 2015-03-30 NOTE — Progress Notes (Addendum)
Patient ID: [name], male   DOB: 1965-12-11, 50 y.o.   MRN: [id number]  HPI: [name] is a 50 y.o.-year-old male, returning to f/u for DM2, dx in 2003, insulin-dependent, uncontrolled, without complications. Last visit 3 mo ago. He has [employer].  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 In the last 2 years, his swallowing is more impaired. Mother is primary caregiver.   + shoulder pain since last visit.  He also has a patch of dry skin on ischium per pt's mom report >> could not get a good look this at today's visit - could not lift pt. They are applying Neosporin.  Hemoglobin A1c levels reviewed per records from PCP and per Epic records: 02/23/2015: HbA1C: 6.3% Lab Results  Component Value Date   HGBA1C 6.9 12/28/2014   HGBA1C 6.8 09/25/2014   HGBA1C 5.8 06/25/2014   12/15/2013: HbA1c 8.9% 06/12/2013: HbA1c 7.8% 12/13/2012: HbA1c 7.5% 12/07/2011: HbA1c 6.9% 05/30/2010: HbA1c 8.3%  Pt is on a regimen of: - Toujeo 34 units at bedtime. - Glipizide XL 5 mg before breakfast - Trulicity 1.5 mg weekly  - Glipizide instant release 5 mg before dinner (took it only few times) He tried Metformin >> diarrhea. We tried again Metformin >> diarrhea >> now stopped.  He was on Onglyza >> now stopped.  Pt does check his sugars 1-3x a day: - am:116-185 >> 112, 121 >> 117, 177, 182 >> 138-177 >> n/c - 2h after b'fast: 146-183 >> 123-197 - before lunch: 205-390 >> 119-152, 186 >> 125-192, 267 >> 126, 129 >> 111-158 - 2h after lunch: 210-268 >> n/c >> 160, 162 >> 125, 230 >> 119, 221 >> 249x1 - before dinner: 238-315 >> 129 >> 117, 162 >> 121, 203 >> 119-129 >> 112, 167 - after dinner: 222-278 >> 204 >> n/c >> 112-137 >> 126, 138, 214-243 >> 229, 252 - bedtime: 197 >> n/c >> 96, 138, 182, 233  No lows; he has hypoglycemia awareness at 70.   Pt's meals are: - Breakfast: skips - Lunch: soup, sandwich, burger, diet Mtn dew - Dinner:  grilled chicken, veggies, starch, diet sods - Snacks: PB, cookie  - no CKD, last BUN/creatinine:  Lab Results  Component Value Date   BUN 12 12/15/2013   CREATININE 0.2* 12/15/2013   - last set of lipids: Lab Results  Component Value Date   CHOL 146 12/15/2013   HDL 33* 12/15/2013   LDLCALC 60 12/15/2013   TRIG 266* 12/15/2013   CHOLHDL 4.2 05/30/2010   - last eye exam was in 12/2013. No DR. - no numbness and tingling in his feet. Has good sensation, but cannot move. He cannot move his legs  ROS: Constitutional: no weight gain/loss, no fatigue, no subjective hyperthermia/hypothermia Eyes: no blurry vision, no xerophthalmia ENT: no sore throat, no nodules palpated in throat, + dysphagia/no odynophagia, no hoarseness Cardiovascular: no CP/SOB/no palpitations/leg swelling Respiratory: no cough/SOB Gastrointestinal: no N/V/D/C/ acid reflux Musculoskeletal: no muscle/joint aches Skin: no rashes Neurological: no tremors/numbness/tingling/dizziness  I reviewed pt's medications, allergies, PMH, social hx, family hx, and changes were documented in the history of present illness. Otherwise, unchanged from my initial visit note. Past Medical History  Diagnosis Date  . Diabetes mellitus without complication (HCC)     DM Type II   . GERD (gastroesophageal reflux disease)   . Hypercholesterolemia   . Muscular dystrophy (HCC)    No past surgical history on  file. History   Social History  . Marital Status: single    Spouse Name: N/A    Number of Children: 0   Occupational History  . disabled   Social History Main Topics  . Smoking status: Former Games developer  . Smokeless tobacco: Not on file  . Alcohol Use: No  . Drug Use: No   Current Outpatient Rx  Name  Route  Sig  Dispense  Refill  . CANASA 1000 MG suppository   Rectal   Place 1,000 mg rectally at bedtime as needed (for ulcerative proctitis).       1     Dispense as written.   Marland Kitchen FLUARIX QUADRIVALENT 0.5 ML  injection      TO BE ADMINISTERED BY PHARMACIST FOR IMMUNIZATION      0     Dispense as written.   Marland Kitchen glipiZIDE (GLUCOTROL XL) 5 MG 24 hr tablet      TAKE ONE TABLET BY MOUTH EVERY MORNING WITH BREAKFAST   90 tablet   1   . glipiZIDE (GLUCOTROL) 5 MG tablet   Oral   Take 1 tablet (5 mg total) by mouth daily before supper.   90 tablet   3   . glucose blood (ONETOUCH VERIO) test strip      Use to test blood sugar 2 times daily as instructed.   100 each   11   . HYDROcodone-acetaminophen (HYCET) 7.5-325 mg/15 ml solution   Oral   Take 15 mLs by mouth at bedtime.          . Insulin Glargine (TOUJEO SOLOSTAR) 300 UNIT/ML SOPN   Subcutaneous   Inject 34 Units into the skin at bedtime.   9 pen   1   . Insulin Pen Needle (CAREFINE PEN NEEDLES) 32G X 4 MM MISC      Use 1x a day   100 each   2   . lisinopril (PRINIVIL,ZESTRIL) 2.5 MG tablet   Oral   Take 2.5 mg by mouth.         Marland Kitchen omeprazole (PRILOSEC) 40 MG capsule   Oral   Take 1 capsule (40 mg total) by mouth daily.   14 capsule   1   . ONETOUCH DELICA LANCETS 33G MISC      Use to test blood sugar 2 times daily as instructed.   100 each   11   . potassium chloride 20 MEQ/15ML (10%) SOLN      TAKE 1 TABLESPOONFUL (15 ML) ONCE A DAY ORALLY      6   . rosuvastatin (CRESTOR) 5 MG tablet   Oral   Take 5 mg by mouth.         . sucralfate (CARAFATE) 1 GM/10ML suspension   Oral   Take 10 mLs (1 g total) by mouth 4 (four) times daily -  with meals and at bedtime.   420 mL   0   . TRULICITY 1.5 MG/0.5ML SOPN      INJECT 1.5 MG INTO THE SKIN WEEKLY.   6 pen   1    Allergies  Allergen Reactions  . Penicillins Hives    Has patient had a PCN reaction causing immediate rash, facial/tongue/throat swelling, SOB or lightheadedness with hypotension:unknown Has patient had a PCN reaction causing severe rash involving mucus membranes or skin necrosis:unknown Has patient had a PCN reaction that required  hospitalization:unknown Has patient had a PCN reaction occurring within the last 10 years:infant reaction If all of the  above answers are "NO", then may proceed with Cephalosporin use.    Family History  Problem Relation Age of Onset  . Diabetes Mother   . Colon polyps Mother   . Heart disease Father     CAD  . Diabetes Father    PE: BP 122/88 mmHg  Pulse 96  Temp(Src) 98.2 F (36.8 C) (Oral)  Resp 12  SpO2 99% There is no height or weight on file to calculate BMI. Wt Readings from Last 3 Encounters:  02/11/14 165 lb (74.844 kg)   Constitutional: overweight, in NAD, in wheelchair. Moves few mm groups, but can talk and eat (see HPI)  Eyes: PERRLA, EOMI, no exophthalmos ENT: moist mucous membranes, no thyromegaly, no cervical lymphadenopathy Cardiovascular: RRR, No MRG Respiratory: CTA B Gastrointestinal: abdomen soft, NT, ND, BS+ Musculoskeletal: tetraparesis Skin: moist, warm Neurological: tetraparesis  ASSESSMENT: 1. DM2, insulin-dependent, uncontrolled, without complications  2. L hip pressure rash/sore  PLAN:  1. Patient with long-standing, uncontrolled diabetes, on oral antidiabetic regimen + Lantus + weekly GLP1 R agonist. Sugars are higher but only few checks and his HbA1c is actually better, at 6.3% per last records from PCP from 1 mo ago. Will add Glipizide before dinner (he only used this few times since last visit) as sugars are quite high after this meal. Patient Instructions  Please continue: - Toujeo 34 units at bedtime. - Glipizide XL 5 mg before breakfast - Trulicity 1.5 mg weekly   Try to add - Glipizide instant release 5 mg before dinner   Please return in 2 months with your sugar log.  - continue checking sugars at different times of the day - check 2 times a day, rotating checks - given more sugar logs  - advised for yearly eye exams - he is UTD - will ask PCP for latest CMP and Lipid panel. - Return to clinic in 3 mo with sugar log   2. L  pressure sore - hip - continue Neosporin - he is high risk for pressure ulcers >> refer to wound care Orders Placed This Encounter  Procedures  . AMB referral to wound care center   Received labs from PCP, drawn on 02/23/2015: - Lipid panel: 138/37/306/40  - CMP: Normal, except glucose 178, BUN/creatinine 9/0.2, potassium 2.8

## 2015-03-30 NOTE — Patient Instructions (Signed)
Please continue: - Toujeo 34 units at bedtime. - Glipizide XL 5 mg before breakfast - Trulicity 1.5 mg weekly   Try to add - Glipizide instant release 5 mg before dinner   Please return in 2 months with your sugar log.

## 2015-04-01 ENCOUNTER — Other Ambulatory Visit: Payer: Self-pay | Admitting: Internal Medicine

## 2015-04-19 ENCOUNTER — Telehealth: Payer: Self-pay | Admitting: Internal Medicine

## 2015-04-19 NOTE — Telephone Encounter (Signed)
Wound care app is needed asap the spot on his leg is getting worse, he is in the lot of pain

## 2015-04-21 NOTE — Telephone Encounter (Signed)
[facility] [address] [place] [id number] [phone] appt 04/27/15  @ 1245pm Pt aware of appt date time and location

## 2015-04-27 ENCOUNTER — Ambulatory Visit: Payer: Medicare Other | Admitting: Internal Medicine

## 2015-04-30 ENCOUNTER — Encounter: Payer: Medicare Other | Attending: Surgery | Admitting: Surgery

## 2015-04-30 DIAGNOSIS — E663 Overweight: Secondary | ICD-10-CM | POA: Diagnosis not present

## 2015-04-30 DIAGNOSIS — F419 Anxiety disorder, unspecified: Secondary | ICD-10-CM | POA: Diagnosis not present

## 2015-04-30 DIAGNOSIS — E11622 Type 2 diabetes mellitus with other skin ulcer: Secondary | ICD-10-CM | POA: Diagnosis present

## 2015-04-30 DIAGNOSIS — Z880 Allergy status to penicillin: Secondary | ICD-10-CM | POA: Diagnosis not present

## 2015-04-30 DIAGNOSIS — E7800 Pure hypercholesterolemia, unspecified: Secondary | ICD-10-CM | POA: Diagnosis not present

## 2015-04-30 DIAGNOSIS — L89321 Pressure ulcer of left buttock, stage 1: Secondary | ICD-10-CM | POA: Diagnosis not present

## 2015-04-30 DIAGNOSIS — Z87891 Personal history of nicotine dependence: Secondary | ICD-10-CM | POA: Diagnosis not present

## 2015-04-30 DIAGNOSIS — K219 Gastro-esophageal reflux disease without esophagitis: Secondary | ICD-10-CM | POA: Diagnosis not present

## 2015-04-30 DIAGNOSIS — G1220 Motor neuron disease, unspecified: Secondary | ICD-10-CM | POA: Insufficient documentation

## 2015-04-30 NOTE — Progress Notes (Addendum)
VORIS, TIGERT ([id number]) Visit Report for 04/30/2015 Chief Complaint Document Details Patient Name: Standifer, NOEH A. Date of Service: 04/30/2015 9:30 AM Medical Record Number: [id number] Patient Account Number: [id number] Date of Birth/Sex: [birth year] (50 y.o. Male) Treating RN: Phillis Haggis Primary Care Physician: Azucena Cecil, DAVID Other Clinician: Referring Physician: Carlus Pavlov Treating Physician/Extender: Rudene Re in Treatment: 0 Information Obtained from: Patient Chief Complaint Patient is at the clinic for treatment of an open pressure ulcer to the left thigh buttock and scrotal area for about 4 weeks Electronic Signature(s) Signed: 04/30/2015 10:45:51 AM By: Evlyn Kanner MD, FACS Entered By: Evlyn Kanner on 04/30/2015 10:45:51 Schramm, [name] -------------------------------------------------------------------------------- HPI Details Patient Name: Abascal, Rydge A. Date of Service: 04/30/2015 9:30 AM Medical Record Number: [id number] Patient Account Number: [id number] Date of Birth/Sex: 12-Mar-1965 (50 y.o. Male) Treating RN: Phillis Haggis Primary Care Physician: Azucena Cecil, DAVID Other Clinician: Referring Physician: Carlus Pavlov Treating Physician/Extender: Rudene Re in Treatment: 0 History of Present Illness Location: pain and ulceration in the area of his left upper thigh and buttock and in the region of his scrotum Quality: Patient reports experiencing a dull pain to affected area(s). Severity: Patient states wound are getting worse. Duration: Patient has had the wound for < 4 weeks prior to presenting for treatment Timing: Pain in wound is Intermittent (comes and goes Context: The wound would happen gradually Modifying Factors: Other treatment(s) tried include:offloading cushion for his wheelchair and appropriate mattress Associated Signs and Symptoms: Patient reports having:in the sitting position for 10-12 hours a day  in his wheelchair and the rest of the time in bed HPI Description: 50 year old male with a history of diabetes mellitus type 2 diagnosed in 2003, insulin- dependent, uncontrolled without complications. 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 hemoglobin A1c in January 2017 was 6.3% past medical history significant for diabetes mellitus type 2, GERD, muscular dystrophy and has been a former smoker. during his last visit to his endocrinologist he was diagnosed with a possible rash a injury to his left hip and hence referred to the wound center. The patient has not been a smoker for several years and though his swallowing is difficult he still eating food to the best of his ability. he has bladder and bowel control. Electronic Signature(s) Signed: 04/30/2015 10:40:07 AM By: Evlyn Kanner MD, FACS Previous Signature: 04/30/2015 9:28:09 AM Version By: Evlyn Kanner MD, FACS Entered By: Evlyn Kanner on 04/30/2015 10:40:07 Daigler, [name] ([id number]) -------------------------------------------------------------------------------- Physical Exam Details Patient Name: Dech, Quinton A. Date of Service: 04/30/2015 9:30 AM Medical Record Number: [id number] Patient Account Number: [id number] Date of Birth/Sex: January 21, 1966 (50 y.o. Male) Treating RN: Phillis Haggis Primary Care Physician: Azucena Cecil, DAVID Other Clinician: Referring Physician: Carlus Pavlov Treating Physician/Extender: Rudene Re in Treatment: 0 Constitutional . Pulse regular. Respirations normal and unlabored. Afebrile. . Eyes Nonicteric. Reactive to light. Ears, Nose, Mouth, and Throat Lips, teeth, and gums WNL.Marland Kitchen Moist mucosa without lesions. Neck supple and nontender. No palpable supraclavicular or cervical adenopathy. Normal sized without goiter. Respiratory WNL. No retractions.. Cardiovascular Pedal Pulses WNL. No clubbing,  cyanosis or edema. Gastrointestinal (GI) Abdomen without masses or tenderness.. No liver or spleen enlargement or tenderness.. Lymphatic No adneopathy. No adenopathy. No adenopathy. Musculoskeletal Adexa without tenderness or enlargement.. Digits and nails w/o clubbing, cyanosis, infection, petechiae, ischemia, or inflammatory conditions.. Integumentary (Hair, Skin) No suspicious lesions. No crepitus or fluctuance. No  peri-wound warmth or erythema. No masses.Marland Kitchen Psychiatric Judgement and insight Intact.. No evidence of depression, anxiety, or agitation.. Notes in the area of his left upper thigh and buttocks and also in the area medial near his scrotum he is caught stage I pressure injury and does not have any cellulitis. Electronic Signature(s) Signed: 04/30/2015 10:40:56 AM By: Evlyn Kanner MD, FACS Entered By: Evlyn Kanner on 04/30/2015 10:40:55 Dercole, [name] ([id number]) -------------------------------------------------------------------------------- Physician Orders Details Patient Name: Paxton, Cailen A. Date of Service: 04/30/2015 9:30 AM Medical Record Number: [id number] Patient Account Number: [id number] Date of Birth/Sex: [birth year] (50 y.o. Male) Treating RN: Phillis Haggis Primary Care Physician: Azucena Cecil, DAVID Other Clinician: Referring Physician: Carlus Pavlov Treating Physician/Extender: Rudene Re in Treatment: 0 Verbal / Phone Orders: Yes ClinicianAshok Cordia, Debi Read Back and Verified: Yes Diagnosis Coding Wound Cleansing Wound #1 Left,Posterior Upper Leg o Clean wound with Normal Saline. Anesthetic Wound #1 Left,Posterior Upper Leg o Topical Lidocaine 4% cream applied to wound bed prior to debridement Skin Barriers/Peri-Wound Care Wound #1 Left,Posterior Upper Leg o Skin Prep Secondary Dressing Wound #1 Left,Posterior Upper Leg o Boardered Foam Dressing Dressing Change Frequency Wound #1 Left,Posterior Upper Leg o Change  dressing every other day. Follow-up Appointments Wound #1 Left,Posterior Upper Leg o Return Appointment in 2 weeks. Off-Loading Wound #1 Left,Posterior Upper Leg o Turn and reposition every 2 hours o Other: - check and see what kind of mattress you have Additional Orders / Instructions Wound #1 Left,Posterior Upper Leg o Increase protein intake. Medications-please add to medication list. Abdelrahman, LUI BELLIS ([id number]) Wound #1 Left,Posterior Upper Leg o Other: - Vitamin A, Vitamin C, Zinc, Multivitamin Electronic Signature(s) Signed: 04/30/2015 4:02:38 PM By: Evlyn Kanner MD, FACS Signed: 05/04/2015 4:28:40 PM By: Alejandro Mulling Entered By: Alejandro Mulling on 04/30/2015 10:17:45 Blando, [name] ([id number]) -------------------------------------------------------------------------------- Problem List Details Patient Name: Carreker, Jeremia A. Date of Service: 04/30/2015 9:30 AM Medical Record Number: [id number] Patient Account Number: [id number] Date of Birth/Sex: [birth year] (50 y.o. Male) Treating RN: Phillis Haggis Primary Care Physician: Azucena Cecil, DAVID Other Clinician: Referring Physician: Carlus Pavlov Treating Physician/Extender: Rudene Re in Treatment: 0 Active Problems ICD-10 Encounter Code Description Active Date Diagnosis G12.20 Motor neuron disease, unspecified 04/30/2015 Yes E11.622 Type 2 diabetes mellitus with other skin ulcer 04/30/2015 Yes L89.321 Pressure ulcer of left buttock, stage 1 04/30/2015 Yes E66.3 Overweight 04/30/2015 Yes Inactive Problems Resolved Problems Electronic Signature(s) Signed: 04/30/2015 10:45:26 AM By: Evlyn Kanner MD, FACS Entered By: Evlyn Kanner on 04/30/2015 10:45:26 Durden, [name] -------------------------------------------------------------------------------- Progress Note Details Patient Name: Labuda, Susie A. Date of Service: 04/30/2015 9:30 AM Medical Record Number: [id number] Patient Account  Number: [id number] Date of Birth/Sex: 03/12/1965 (50 y.o. Male) Treating RN: Phillis Haggis Primary Care Physician: Azucena Cecil, DAVID Other Clinician: Referring Physician: Carlus Pavlov Treating Physician/Extender: Rudene Re in Treatment: 0 Subjective Chief Complaint Information obtained from Patient Patient is at the clinic for treatment of an open pressure ulcer to the left thigh buttock and scrotal area for about 4 weeks History of Present Illness (HPI) The following HPI elements were documented for the patient's wound: Location: pain and ulceration in the area of his left upper thigh and buttock and in the region of his scrotum Quality: Patient reports experiencing a dull pain to affected area(s). Severity: Patient states wound are getting worse. Duration: Patient has had the wound for < 4 weeks prior to presenting for treatment Timing: Pain in wound is Intermittent (comes and goes Context: The wound would happen gradually  Modifying Factors: Other treatment(s) tried include:offloading cushion for his wheelchair and appropriate mattress Associated Signs and Symptoms: Patient reports having:in the sitting position for 10-12 hours a day in his wheelchair and the rest of the time in bed 50 year old male with a history of diabetes mellitus type 2 diagnosed in 2003, insulin-dependent, uncontrolled without complications. 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 hemoglobin A1c in January 2017 was 6.3% past medical history significant for diabetes mellitus type 2, GERD, muscular dystrophy and has been a former smoker. during his last visit to his endocrinologist he was diagnosed with a possible rash a injury to his left hip and hence referred to the wound center. The patient has not been a smoker for several years and though his swallowing is difficult he still eating food to the best of  his ability. he has bladder and bowel control. Wound History Patient presents with 1 open wound that has been present for approximately 1 month. Patient has been treating wound in the following manner: neosporin. Laboratory tests have not been performed in the last month. Patient reportedly has not tested positive for an antibiotic resistant organism. Patient reportedly has not tested positive for osteomyelitis. Patient reportedly has not had testing performed to evaluate circulation in the legs. Patient experiences the following problems associated with their wounds: swelling. Patient History Jastrzebski, Worthy RancherUL A. ([id number]) Information obtained from Patient. Allergies penicillin (Severity: Severe, Reaction: rash) Family History Cancer - Maternal Grandparents, Diabetes - Mother, Father, Heart Disease - Father, Hypertension - Mother, Father, Thyroid Problems - Mother, No family history of Hereditary Spherocytosis, Kidney Disease, Lung Disease, Seizures, Stroke, Tuberculosis. Social History Never smoker - 3-4 years quit faLL 1987, Marital Status - Single, Alcohol Use - Never - HX, Drug Use - No History, Caffeine Use - Daily. Medical History Endocrine Patient has history of Type II Diabetes Patient is treated with Insulin, Oral Agents. Blood sugar is not tested. Medical And Surgical History Notes Eyes stigmatism Cardiovascular hypercholesterolemia Gastrointestinal GERD Immunological Muscular Distrophy Musculoskeletal Muscular Distrophy Review of Systems (ROS) Constitutional Symptoms (General Health) The patient has no complaints or symptoms. Eyes Complains or has symptoms of Glasses / Contacts - glasses. Ear/Nose/Mouth/Throat The patient has no complaints or symptoms. Hematologic/Lymphatic The patient has no complaints or symptoms. Respiratory Complains or has symptoms of Shortness of Breath - with exertion. Genitourinary The patient has no complaints or  symptoms. Integumentary (Skin) Complains or has symptoms of Wounds. Musculoskeletal Complains or has symptoms of Muscle Pain, Muscle Weakness. Chalmers, Worthy RancherUL A. ([id number]) Neurologic The patient has no complaints or symptoms. Oncologic The patient has no complaints or symptoms. Psychiatric Complains or has symptoms of Anxiety. Objective Constitutional Pulse regular. Respirations normal and unlabored. Afebrile. Vitals Time Taken: 9:34 AM, Height: 65 in, Source: Stated, Source: Stated, Temperature: 98.0 F, Pulse: 92 bpm, Respiratory Rate: 18 breaths/min, Blood Pressure: 160/86 mmHg. Eyes Nonicteric. Reactive to light. Ears, Nose, Mouth, and Throat Lips, teeth, and gums WNL.Marland Kitchen. Moist mucosa without lesions. Neck supple and nontender. No palpable supraclavicular or cervical adenopathy. Normal sized without goiter. Respiratory WNL. No retractions.. Cardiovascular Pedal Pulses WNL. No clubbing, cyanosis or edema. Gastrointestinal (GI) Abdomen without masses or tenderness.. No liver or spleen enlargement or tenderness.. Lymphatic No adneopathy. No adenopathy. No adenopathy. Musculoskeletal Adexa without tenderness or enlargement.. Digits and nails w/o clubbing, cyanosis, infection, petechiae, ischemia, or inflammatory conditions.Marland Kitchen. Psychiatric Judgement and insight Intact.. No evidence of  depression, anxiety, or agitation.Marland Kitchen Ruffner, BARNARD SHARPS ([id number]) General Notes: in the area of his left upper thigh and buttocks and also in the area medial near his scrotum he is caught stage I pressure injury and does not have any cellulitis. Integumentary (Hair, Skin) No suspicious lesions. No crepitus or fluctuance. No peri-wound warmth or erythema. No masses.. Wound #1 status is Open. Original cause of wound was Gradually Appeared. The wound is located on the Left,Posterior Upper Leg. The wound measures 0.1cm length x 0.1cm width x 0.1cm depth; 0.008cm^2 area and 0.001cm^3 volume. The wound  is limited to skin breakdown. There is no tunneling or undermining noted. There is a large amount of serous drainage noted. The wound margin is flat and intact. There is large (67-100%) pink granulation within the wound bed. There is a small (1-33%) amount of necrotic tissue within the wound bed including Adherent Slough. The periwound skin appearance exhibited: Dry/Scaly. Periwound temperature was noted as No Abnormality. The periwound has tenderness on palpation. Assessment Active Problems ICD-10 G12.20 - Motor neuron disease, unspecified E11.622 - Type 2 diabetes mellitus with other skin ulcer L89.321 - Pressure ulcer of left buttock, stage 1 E66.3 - Overweight Plan Wound Cleansing: Wound #1 Left,Posterior Upper Leg: Clean wound with Normal Saline. Anesthetic: Wound #1 Left,Posterior Upper Leg: Topical Lidocaine 4% cream applied to wound bed prior to debridement Skin Barriers/Peri-Wound Care: Wound #1 Left,Posterior Upper Leg: Skin Prep Secondary Dressing: Wound #1 Left,Posterior Upper Leg: Boardered Foam Dressing Dressing Change Frequency: Wound #1 Left,Posterior Upper Leg: Change dressing every other day. Follow-up Appointments: ANGIE, HOGG ([id number]) Wound #1 Left,Posterior Upper Leg: Return Appointment in 2 weeks. Off-Loading: Wound #1 Left,Posterior Upper Leg: Turn and reposition every 2 hours Other: - check and see what kind of mattress you have Additional Orders / Instructions: Wound #1 Left,Posterior Upper Leg: Increase protein intake. 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 after examining him I have recommended: 1. An appropriate bordered foam dressing to be applied over this area and change daily as required 2. Off  loading in great detail 3. Appropriate questions for his wheelchair and an appropriate mattress for his bed 4. Protein supplements and vitamin A, vitamin C and zinc 5. when his swallowing gets too difficult the possibility of using a PEG tube for nutrition 6. See me back for review at regular intervals Electronic Signature(s) Signed: 04/30/2015 10:46:06 AM By: Evlyn Kanner MD, FACS Previous Signature: 04/30/2015 10:43:32 AM Version By: Evlyn Kanner MD, FACS Entered By: Evlyn Kanner on 04/30/2015 10:46:06 Clary, [name] -------------------------------------------------------------------------------- ROS/PFSH Details Patient Name: Sigmund, Dermot A. Date of Service: 04/30/2015 9:30 AM Medical Record Number: [id number] Patient Account Number: [id number] Date of Birth/Sex: Jul 20, 1965 (50 y.o. Male) Treating RN: Phillis Haggis Primary Care Physician: Azucena Cecil, DAVID Other Clinician: Referring Physician: Carlus Pavlov Treating Physician/Extender: Rudene Re in Treatment: 0 Information Obtained From Patient Wound History Do you currently have one or more open woundso Yes How many open wounds do you currently haveo 1 Approximately how long have you had your woundso 1 month How have you been treating your wound(s) until nowo neosporin Has your wound(s) ever healed and then re-openedo No Have you had any lab work done in the past montho No Have you tested positive for an antibiotic resistant organism (MRSA, VRE)o  No Have you tested positive for osteomyelitis (bone infection)o No Have you had any tests for circulation on your legso No Have you had other problems associated with your woundso Swelling Eyes Complaints and Symptoms: Positive for: Glasses / Contacts - glasses Medical History: Past Medical History Notes: stigmatism Respiratory Complaints and Symptoms: Positive for: Shortness of Breath - with exertion Integumentary (Skin) Complaints and  Symptoms: Positive for: Wounds Musculoskeletal Complaints and Symptoms: Positive for: Muscle Pain; Muscle Weakness Medical History: Past Medical History Notes: Muscular Distrophy Nomura, ODAY RIDINGS ([id number]) Psychiatric Complaints and Symptoms: Positive for: Anxiety Constitutional Symptoms (General Health) Complaints and Symptoms: No Complaints or Symptoms Ear/Nose/Mouth/Throat Complaints and Symptoms: No Complaints or Symptoms Hematologic/Lymphatic Complaints and Symptoms: No Complaints or Symptoms Cardiovascular Medical History: Past Medical History Notes: hypercholesterolemia Gastrointestinal Medical History: Past Medical History Notes: GERD Endocrine Medical History: Positive for: Type II Diabetes Time with diabetes: 2005 Treated with: Insulin, Oral agents Blood sugar tested every day: No Genitourinary Complaints and Symptoms: No Complaints or Symptoms Immunological Medical History: Past Medical History Notes: Muscular Distrophy Ohlin, Bandon A. ([id number]) Neurologic Complaints and Symptoms: No Complaints or Symptoms Oncologic Complaints and Symptoms: No Complaints or Symptoms Family and Social History Cancer: Yes - Maternal Grandparents; Diabetes: Yes - Mother, Father; Heart Disease: Yes - Father; Hereditary Spherocytosis: No; Hypertension: Yes - Mother, Father; Kidney Disease: No; Lung Disease: No; Seizures: No; Stroke: No; Thyroid Problems: Yes - Mother; Tuberculosis: No; Never smoker - 3-4 years quit faLL 1987; Marital Status - Single; Alcohol Use: Never - HX; Drug Use: No History; Caffeine Use: Daily; Financial Concerns: No; Food, Clothing or Shelter Needs: No; Support System Lacking: No; Transportation Concerns: No; Advanced Directives: No; Patient does not want information on Advanced Directives; Do not resuscitate: No; Living Will: No; Medical Power of Attorney: No Physician Affirmation I have reviewed and agree with the above  information. Electronic Signature(s) Signed: 04/30/2015 10:41:12 AM By: Evlyn Kanner MD, FACS Signed: 05/04/2015 4:28:40 PM By: Alejandro Mulling Entered By: Evlyn Kanner on 04/30/2015 10:41:11 Vanecek, [name] -------------------------------------------------------------------------------- SuperBill Details Patient Name: Shuart, Bladen A. Date of Service: 04/30/2015 Medical Record Number: [id number] Patient Account Number: [id number] Date of Birth/Sex: [birth year] (50 y.o. Male) Treating RN: Phillis Haggis Primary Care Physician: Azucena Cecil, DAVID Other Clinician: Referring Physician: Carlus Pavlov Treating Physician/Extender: Rudene Re in Treatment: 0 Diagnosis Coding ICD-10 Codes Code Description G12.20 Motor neuron disease, unspecified E11.622 Type 2 diabetes mellitus with other skin ulcer L89.321 Pressure ulcer of left buttock, stage 1 E66.3 Overweight Facility Procedures CPT4 Code: [id number] Description: 99214 - WOUND CARE VISIT-LEV 4 EST PT Modifier: Quantity: 1 Physician Procedures CPT4 Code: 3244010 Description: 99204 - WC PHYS LEVEL 4 - NEW PT ICD-10 Description Diagnosis G12.20 Motor neuron disease, unspecified E11.622 Type 2 diabetes mellitus with other skin ulcer L89.321 Pressure ulcer of left buttock, stage 1 E66.3 Overweight Modifier: Quantity: 1 Electronic Signature(s) Signed: 04/30/2015 4:02:38 PM By: Evlyn Kanner MD, FACS Signed: 05/04/2015 4:28:40 PM By: Alejandro Mulling Previous Signature: 04/30/2015 10:46:19 AM Version By: Evlyn Kanner MD, FACS Entered By: Alejandro Mulling on 04/30/2015 14:14:45

## 2015-05-01 ENCOUNTER — Other Ambulatory Visit: Payer: Self-pay | Admitting: Internal Medicine

## 2015-05-04 ENCOUNTER — Encounter (HOSPITAL_BASED_OUTPATIENT_CLINIC_OR_DEPARTMENT_OTHER): Payer: Medicare Other

## 2015-05-05 NOTE — Progress Notes (Signed)
[name] ([id number]) Visit Report for 04/30/2015 Allergy List Details Patient Name: [name] Date of Service: 04/30/2015 9:30 AM Medical Record Number: [id number] Patient Account Number: [id number] Date of Birth/Sex: 1965/07/04 37(49 y.o. Male) Treating RN: Phillis HaggisPinkerton, Debi Primary Care Physician: Azucena CecilSWAYNE, DAVID Other Clinician: Referring Physician: Carlus PavlovGHERGHE, CRISTINA Treating Physician/Extender: Rudene ReBritto, Errol Weeks in Treatment: 0 Allergies Active Allergies penicillin Reaction: rash Severity: Severe Allergy Notes Electronic Signature(s) Signed: 05/04/2015 4:28:40 PM By: Alejandro MullingPinkerton, Debra Entered By: Alejandro MullingPinkerton, Debra on 04/30/2015 09:43:01 Zimmers, [name] ([id number]) -------------------------------------------------------------------------------- Arrival Information Details Patient Name: [name] Date of Service: 04/30/2015 9:30 AM Medical Record Number: [id number] Patient Account Number: [id number] Date of Birth/Sex: 1965/07/04 [age] y.o. Male) Treating RN: Phillis HaggisPinkerton, Debi Primary Care Physician: Azucena CecilSWAYNE, DAVID Other Clinician: Referring Physician: Carlus PavlovGHERGHE, CRISTINA Treating Physician/Extender: Rudene ReBritto, Errol Weeks in Treatment: 0 Visit Information Patient Arrived: Wheel Chair Arrival Time: 09:33 Accompanied By: mother Transfer Assistance: Nurse, adultHoyer Lift Patient Identification Verified: Yes Secondary Verification Process Yes Completed: Patient Requires Transmission-Based No Precautions: Patient Has Alerts: Yes Patient Alerts: DM II Electronic Signature(s) Signed: 05/04/2015 4:28:40 PM By: Alejandro MullingPinkerton, Debra Entered By: Alejandro MullingPinkerton, Debra on 04/30/2015 09:34:03 [name], [name] ([id number]) -------------------------------------------------------------------------------- Clinic Level of Care Assessment Details Patient Name: [name] Date of Service: 04/30/2015 9:30 AM Medical Record Number: [id number] Patient Account Number: [id number] Date of Birth/Sex:  1965/07/04 43(49 y.o. Male) Treating RN: Phillis HaggisPinkerton, Debi Primary Care Physician: Azucena CecilSWAYNE, DAVID Other Clinician: Referring Physician: Carlus PavlovGHERGHE, CRISTINA Treating Physician/Extender: Rudene ReBritto, Errol Weeks in Treatment: 0 Clinic Level of Care Assessment Items TOOL 2 Quantity Score X - Use when only an EandM is performed on the INITIAL visit 1 0 ASSESSMENTS - Nursing Assessment / Reassessment X - General Physical Exam (combine w/ comprehensive assessment (listed just 1 20 below) when performed on new pt. evals) X - Comprehensive Assessment (HX, ROS, Risk Assessments, Wounds Hx, etc.) 1 25 ASSESSMENTS - Wound and Skin Assessment / Reassessment X - Simple Wound Assessment / Reassessment - one wound 1 5 []  - Complex Wound Assessment / Reassessment - multiple wounds 0 []  - Dermatologic / Skin Assessment (not related to wound area) 0 ASSESSMENTS - Ostomy and/or Continence Assessment and Care []  - Incontinence Assessment and Management 0 []  - Ostomy Care Assessment and Management (repouching, etc.) 0 PROCESS - Coordination of Care []  - Simple Patient / Family Education for ongoing care 0 X - Complex (extensive) Patient / Family Education for ongoing care 1 20 []  - Staff obtains ChiropractorConsents, Records, Test Results / Process Orders 0 []  - Staff telephones HHA, Nursing Homes / Clarify orders / etc 0 []  - Routine Transfer to another Facility (non-emergent condition) 0 []  - Routine Hospital Admission (non-emergent condition) 0 X - New Admissions / Manufacturing engineernsurance Authorizations / Ordering NPWT, Apligraf, etc. 1 15 []  - Emergency Hospital Admission (emergent condition) 0 X - Simple Discharge Coordination 1 10 [name] ([id number]) []  - Complex (extensive) Discharge Coordination 0 PROCESS - Special Needs []  - Pediatric / Minor Patient Management 0 []  - Isolation Patient Management 0 []  - Hearing / Language / Visual special needs 0 []  - Assessment of Community assistance (transportation, D/C planning,  etc.) 0 []  - Additional assistance / Altered mentation 0 []  - Support Surface(s) Assessment (bed, cushion, seat, etc.) 0 INTERVENTIONS - Wound Cleansing / Measurement X - Wound Imaging (photographs - any number of wounds) 1 5 []  - Wound Tracing (instead of photographs) 0 []  - Simple Wound Measurement - one wound 0 X - Complex Wound Measurement -  multiple wounds 1 5 []  - Simple Wound Cleansing - one wound 0 []  - Complex Wound Cleansing - multiple wounds 0 INTERVENTIONS - Wound Dressings X - Small Wound Dressing one or multiple wounds 1 10 []  - Medium Wound Dressing one or multiple wounds 0 []  - Large Wound Dressing one or multiple wounds 0 X - Application of Medications - injection 1 10 INTERVENTIONS - Miscellaneous []  - External ear exam 0 []  - Specimen Collection (cultures, biopsies, blood, body fluids, etc.) 0 []  - Specimen(s) / Culture(s) sent or taken to Lab for analysis 0 []  - Patient Transfer (multiple staff / Nurse, adult / Similar devices) 0 []  - Simple Staple / Suture removal (25 or less) 0 []  - Complex Staple / Suture removal (26 or more) 0 [name] ([id number]) []  - Hypo / Hyperglycemic Management (close monitor of Blood Glucose) 0 []  - Ankle / Brachial Index (ABI) - do not check if billed separately 0 Has the patient been seen at the hospital within the last three years: Yes Total Score: 125 Level Of Care: New/Established - Level 4 Electronic Signature(s) Signed: 05/04/2015 4:28:40 PM By: Alejandro Mulling Entered By: Alejandro Mulling on 04/30/2015 14:14:35 [name] ([id number]) -------------------------------------------------------------------------------- Encounter Discharge Information Details Patient Name: [name], [name] Date of Service: 04/30/2015 9:30 AM Medical Record Number: [id number] Patient Account Number: [id number] Date of Birth/Sex: [birth year] (50 y.o. Male) Treating RN: Phillis Haggis Primary Care Physician: Azucena Cecil, DAVID Other  Clinician: Referring Physician: Carlus Pavlov Treating Physician/Extender: Rudene Re in Treatment: 0 Encounter Discharge Information Items Discharge Pain Level: 0 Discharge Condition: Stable Ambulatory Status: Wheelchair Discharge Destination: Home Transportation: Private Auto Accompanied By: mother Schedule Follow-up Appointment: Yes Medication Reconciliation completed and provided to Patient/Care No Butch Otterson: Provided on Clinical Summary of Care: 04/30/2015 Form Type Recipient Paper Patient PM Electronic Signature(s) Signed: 04/30/2015 10:39:34 AM By: Gwenlyn Perking Entered By: Gwenlyn Perking on 04/30/2015 10:39:34 Haag, [name] ([id number]) -------------------------------------------------------------------------------- Lower Extremity Assessment Details Patient Name: Wigington, Derward Best. Date of Service: 04/30/2015 9:30 AM Medical Record Number: [id number] Patient Account Number: [id number] Date of Birth/Sex: 1965-05-28 (50 y.o. Male) Treating RN: Phillis Haggis Primary Care Physician: Azucena Cecil, DAVID Other Clinician: Referring Physician: Carlus Pavlov Treating Physician/Extender: Rudene Re in Treatment: 0 Electronic Signature(s) Signed: 05/04/2015 4:28:40 PM By: Alejandro Mulling Entered By: Alejandro Mulling on 04/30/2015 09:56:14 Romberg, [name] ([id number]) -------------------------------------------------------------------------------- Multi Wound Chart Details Patient Name: Folger, [name] Date of Service: 04/30/2015 9:30 AM Medical Record Number: [id number] Patient Account Number: [id number] Date of Birth/Sex: July 20, 1965 (50 y.o. Male) Treating RN: Phillis Haggis Primary Care Physician: Azucena Cecil, DAVID Other Clinician: Referring Physician: Carlus Pavlov Treating Physician/Extender: Rudene Re in Treatment: 0 Vital Signs Height(in): 65 Pulse(bpm): 92 Weight(lbs): Blood Pressure 160/86 (mmHg): Body Mass  Index(BMI): Temperature(F): 98.0 Respiratory Rate 18 (breaths/min): Photos: [1:No Photos] [N/Best:N/Best] Wound Location: [1:Left Upper Leg - Posterior] [N/Best:N/Best] Wounding Event: [1:Gradually Appeared] [N/Best:N/Best] Primary Etiology: [1:Pressure Ulcer] [N/Best:N/Best] Comorbid History: [1:Type II Diabetes] [N/Best:N/Best] Date Acquired: [1:03/30/2015] [N/Best:N/Best] Weeks of Treatment: [1:0] [N/Best:N/Best] Wound Status: [1:Open] [N/Best:N/Best] Measurements L x W x D 0.1x0.1x0.1 [N/Best:N/Best] (cm) Area (cm) : [1:0.008] [N/Best:N/Best] Volume (cm) : [1:0.001] [N/Best:N/Best] % Reduction in Area: [1:0.00%] [N/Best:N/Best] % Reduction in Volume: 0.00% [N/Best:N/Best] Classification: [1:Category/Stage I] [N/Best:N/Best] HBO Classification: [1:Grade 1] [N/Best:N/Best] Exudate Amount: [1:Small] [N/Best:N/Best] Exudate Type: [1:Serosanguineous] [N/Best:N/Best] Exudate Color: [1:red, brown] [N/Best:N/Best] Wound Margin: [1:Flat and Intact] [N/Best:N/Best] Granulation Amount: [1:None Present (0%)] [N/Best:N/Best] Necrotic Amount: [1:None Present (0%)] [N/Best:N/Best] Exposed Structures: [1:Fascia: No Fat: No  Tendon: No Muscle: No Joint: No Bone: No Limited to Skin Breakdown] [N/Best:N/Best] Epithelialization: None N/Best N/Best Periwound Skin Texture: No Abnormalities Noted N/Best N/Best Periwound Skin Dry/Scaly: Yes N/Best N/Best Moisture: Periwound Skin Color: No Abnormalities Noted N/Best N/Best Temperature: No Abnormality N/Best N/Best Tenderness on Yes N/Best N/Best Palpation: Wound Preparation: Ulcer Cleansing: N/Best N/Best Rinsed/Irrigated with Saline Topical Anesthetic Applied: Other: lidocaine 4% Treatment Notes Electronic Signature(s) Signed: 05/04/2015 4:28:40 PM By: Alejandro Mulling Entered By: Alejandro Mulling on 04/30/2015 10:12:45 Albright, [name] ([id number]) -------------------------------------------------------------------------------- Multi-Disciplinary Care Plan Details Patient Name: Conely, [name] Date of Service: 04/30/2015 9:30 AM Medical Record Number: [id number] Patient Account Number: [id number] Date  of Birth/Sex: 1965-03-12 (50 y.o. Male) Treating RN: Phillis Haggis Primary Care Physician: Azucena Cecil, DAVID Other Clinician: Referring Physician: Carlus Pavlov Treating Physician/Extender: Rudene Re in Treatment: 0 Active Inactive Abuse / Safety / Falls / Self Care Management Nursing Diagnoses: Potential for falls Goals: Patient will remain injury free Date Initiated: 04/30/2015 Goal Status: Active Interventions: Assess fall risk on admission and as needed Assess impairment of mobility on admission and as needed per policy Notes: Nutrition Nursing Diagnoses: Imbalanced nutrition Impaired glucose control: actual or potential Goals: Patient/caregiver agrees to and verbalizes understanding of need to use nutritional supplements and/or vitamins as prescribed Date Initiated: 04/30/2015 Goal Status: Active Interventions: Assess patient nutrition upon admission and as needed per policy Notes: Orientation to the Wound Care Program Nursing Diagnoses: Knowledge deficit related to the wound healing center program Snipe, [name] ([id number]) Goals: Patient/caregiver will verbalize understanding of the Wound Healing Center Program Date Initiated: 04/30/2015 Goal Status: Active Interventions: Provide education on orientation to the wound center Notes: Pain, Acute or Chronic Nursing Diagnoses: Pain, acute or chronic: actual or potential Potential alteration in comfort, pain Goals: Patient will verbalize adequate pain control and receive pain control interventions during procedures as needed Date Initiated: 04/30/2015 Goal Status: Active Interventions: Assess comfort goal upon admission Complete pain assessment as per visit requirements Notes: Pressure Nursing Diagnoses: Knowledge deficit related to causes and risk factors for pressure ulcer development Goals: Patient will remain free from development of additional pressure ulcers Date Initiated: 04/30/2015 Goal  Status: Active Interventions: Assess: immobility, friction, shearing, incontinence upon admission and as needed Assess offloading mechanisms upon admission and as needed Notes: Wound/Skin Impairment Nursing Diagnoses: Varghese, [name] ([id number]) Impaired tissue integrity Goals: Ulcer/skin breakdown will have Best volume reduction of 30% by week 4 Date Initiated: 04/30/2015 Goal Status: Active Ulcer/skin breakdown will have Best volume reduction of 50% by week 8 Date Initiated: 04/30/2015 Goal Status: Active Ulcer/skin breakdown will have Best volume reduction of 80% by week 12 Date Initiated: 04/30/2015 Goal Status: Active Interventions: Assess patient/caregiver ability to obtain necessary supplies Assess ulceration(s) every visit Notes: Electronic Signature(s) Signed: 05/04/2015 4:28:40 PM By: Alejandro Mulling Entered By: Alejandro Mulling on 04/30/2015 10:44:41 Popwell, [name] ([id number]) -------------------------------------------------------------------------------- Pain Assessment Details Patient Name: Puller, [name] Date of Service: 04/30/2015 9:30 AM Medical Record Number: [id number] Patient Account Number: [id number] Date of Birth/Sex: 1965/04/23 (50 y.o. Male) Treating RN: Phillis Haggis Primary Care Physician: Azucena Cecil, DAVID Other Clinician: Referring Physician: Carlus Pavlov Treating Physician/Extender: Rudene Re in Treatment: 0 Active Problems Location of Pain Severity and Description of Pain Patient Has Paino No Site Locations Pain Management and Medication Current Pain Management: Electronic Signature(s) Signed: 05/04/2015 4:28:40 PM By: Alejandro Mulling Entered By: Alejandro Mulling on 04/30/2015 09:34:15 Naab, [name] ([id number]) -------------------------------------------------------------------------------- Patient/Caregiver Education Details Patient Name: Greenslade, [name] Date of Service:  04/30/2015 9:30 AM Medical Record Number:  [id number] Patient Account Number: [id number] Date of Birth/Gender: 20-Feb-1965 (50 y.o. Male) Treating RN: Phillis Haggis Primary Care Physician: Azucena Cecil, DAVID Other Clinician: Referring Physician: Carlus Pavlov Treating Physician/Extender: Rudene Re in Treatment: 0 Education Assessment Education Provided To: Patient and Caregiver Education Topics Provided Wound/Skin Impairment: Handouts: Other: change dressing as ordered Methods: Demonstration, Explain/Verbal Responses: State content correctly Electronic Signature(s) Signed: 05/04/2015 4:28:40 PM By: Alejandro Mulling Entered By: Alejandro Mulling on 04/30/2015 10:16:11 Co, [name] ([id number]) -------------------------------------------------------------------------------- Wound Assessment Details Patient Name: Corradi, [name] Date of Service: 04/30/2015 9:30 AM Medical Record Number: [id number] Patient Account Number: [id number] Date of Birth/Sex: November 01, 1965 (50 y.o. Male) Treating RN: Phillis Haggis Primary Care Physician: Azucena Cecil, DAVID Other Clinician: Referring Physician: Carlus Pavlov Treating Physician/Extender: Rudene Re in Treatment: 0 Wound Status Wound Number: 1 Primary Etiology: Pressure Ulcer Wound Location: Left, Posterior Upper Leg Wound Status: Open Wounding Event: Gradually Appeared Comorbid History: Type II Diabetes Date Acquired: 03/30/2015 Weeks Of Treatment: 0 Clustered Wound: No Photos Photo Uploaded By: Alejandro Mulling on 04/30/2015 17:15:50 Wound Measurements Length: (cm) 6 Width: (cm) 6 Depth: (cm) 0.1 Area: (cm) 28.274 Volume: (cm) 2.827 % Reduction in Area: -353325% % Reduction in Volume: -282600% Epithelialization: None Tunneling: No Undermining: No Wound Description Classification: Category/Stage II Diabetic Severity [address]): Grade 1 Wound Margin: Flat and Intact Exudate Amount: Large Exudate Type: Serous Exudate Color: amber Foul Odor After  Cleansing: No Wound Bed Granulation Amount: Large (67-100%) Exposed Structure Granulation Quality: Pink Fascia Exposed: No Necrotic Amount: Small (1-33%) Fat Layer Exposed: No Iannelli, [name] Kitchen ([id number]) Necrotic Quality: Adherent Slough Tendon Exposed: No Muscle Exposed: No Joint Exposed: No Bone Exposed: No Limited to Skin Breakdown Periwound Skin Texture Texture Color No Abnormalities Noted: No No Abnormalities Noted: No Moisture Temperature / Pain No Abnormalities Noted: No Temperature: No Abnormality Dry / Scaly: Yes Tenderness on Palpation: Yes Wound Preparation Ulcer Cleansing: Rinsed/Irrigated with Saline Topical Anesthetic Applied: Other: lidocaine 4%, Treatment Notes Wound #1 (Left, Posterior Upper Leg) 1. Cleansed with: Clean wound with Normal Saline 2. Anesthetic Topical Lidocaine 4% cream to wound bed prior to debridement 3. Peri-wound Care: Skin Prep 5. Secondary Dressing Applied Bordered Foam Dressing Electronic Signature(s) Signed: 05/04/2015 4:28:40 PM By: Alejandro Mulling Entered By: Alejandro Mulling on 04/30/2015 10:46:05 Murtagh, [name] ([id number]) -------------------------------------------------------------------------------- Vitals Details Patient Name: [name], [name] Date of Service: 04/30/2015 9:30 AM Medical Record Number: [id number] Patient Account Number: [id number] Date of Birth/Sex: [birth year] (50 y.o. Male) Treating RN: Phillis Haggis Primary Care Physician: Azucena Cecil, DAVID Other Clinician: Referring Physician: Carlus Pavlov Treating Physician/Extender: Rudene Re in Treatment: 0 Vital Signs Time Taken: 09:34 Temperature (F): 98.0 Height (in): 65 Pulse (bpm): 92 Source: Stated Respiratory Rate (breaths/min): 18 Source: Stated Blood Pressure (mmHg): 160/86 Reference Range: 80 - 120 mg / dl Electronic Signature(s) Signed: 05/04/2015 4:28:40 PM By: Alejandro Mulling Entered By: Alejandro Mulling on 04/30/2015  09:35:45

## 2015-05-05 NOTE — Progress Notes (Signed)
[name], Durante A. ([id number]) Visit Report for 04/30/2015 Abuse/Suicide Risk Screen Details Patient Name: [name], [name] RancherUL A. Date of Service: 04/30/2015 9:30 AM Medical Record Number: [id number] Patient Account Number: [id number] Date of Birth/Sex: 09-Jul-1965 2(49 y.o. Male) Treating RN: Phillis HaggisPinkerton, Debi Primary Care Physician: Azucena CecilSWAYNE, DAVID Other Clinician: Referring Physician: Carlus PavlovGHERGHE, CRISTINA Treating Physician/Extender: Rudene ReBritto, Errol Weeks in Treatment: 0 Abuse/Suicide Risk Screen Items Answer ABUSE/SUICIDE RISK SCREEN: Has anyone close to you tried to hurt or harm you recentlyo No Do you feel uncomfortable with anyone in your familyo No Has anyone forced you do things that you didnot want to doo No Do you have any thoughts of harming yourselfo No Patient displays signs or symptoms of abuse and/or neglect. No Electronic Signature(s) Signed: 05/04/2015 4:28:40 PM By: Alejandro MullingPinkerton, Debra Entered By: Alejandro MullingPinkerton, Debra on 04/30/2015 09:53:06 Standing, [name] ([id number]) -------------------------------------------------------------------------------- Activities of Daily Living Details Patient Name: [name], [name] Date of Service: 04/30/2015 9:30 AM Medical Record Number: [id number] Patient Account Number: [id number] Date of Birth/Sex: 09-Jul-1965 48(49 y.o. Male) Treating RN: Phillis HaggisPinkerton, Debi Primary Care Physician: Azucena CecilSWAYNE, DAVID Other Clinician: Referring Physician: Carlus PavlovGHERGHE, CRISTINA Treating Physician/Extender: Rudene ReBritto, Errol Weeks in Treatment: 0 Activities of Daily Living Items Answer Activities of Daily Living (Please select one for each item) Drive Automobile Not Able Take Medications Need Assistance Use Telephone Need Assistance Care for Appearance Not Able Use Toilet Not Able Bath / Shower Not Able Dress Self Not Able Feed Self Not Able Walk Not Able Get In / Out Bed Not Able Housework Not Able Prepare Meals Not Able Handle Money Not Able Shop for Self Not Able Electronic  Signature(s) Signed: 05/04/2015 4:28:40 PM By: Alejandro MullingPinkerton, Debra Entered By: Alejandro MullingPinkerton, Debra on 04/30/2015 09:53:53 [name], [name] ([id number]) -------------------------------------------------------------------------------- Education Assessment Details Patient Name: [name], [name] Date of Service: 04/30/2015 9:30 AM Medical Record Number: [id number] Patient Account Number: [id number] Date of Birth/Sex: 09-Jul-1965 15(49 y.o. Male) Treating RN: Phillis HaggisPinkerton, Debi Primary Care Physician: Azucena CecilSWAYNE, DAVID Other Clinician: Referring Physician: Carlus PavlovGHERGHE, CRISTINA Treating Physician/Extender: Rudene ReBritto, Errol Weeks in Treatment: 0 Primary Learner Assessed: Patient Learning Preferences/Education Level/Primary Language Learning Preference: Explanation, Printed Material Highest Education Level: High School Preferred Language: English Cognitive Barrier Assessment/Beliefs Language Barrier: No Translator Needed: No Memory Deficit: No Emotional Barrier: No Cultural/Religious Beliefs Affecting Medical No Care: Physical Barrier Assessment Impaired Vision: Yes Glasses Impaired Hearing: No Decreased Hand dexterity: Yes Limitations: UNABLE TO USE ARMS OR HANDS Knowledge/Comprehension Assessment Knowledge Level: High Comprehension Level: High Ability to understand written High instructions: Ability to understand verbal High instructions: Motivation Assessment Anxiety Level: Calm Cooperation: Cooperative Education Importance: Acknowledges Need Interest in Health Problems: Asks Questions Perception: Coherent Willingness to Engage in Self- High Management Activities: Readiness to Engage in Self- High Management Activities: Electronic Signature(s) [name], Ura A. ([id number]) Signed: 05/04/2015 4:28:40 PM By: Alejandro MullingPinkerton, Debra Entered By: Alejandro MullingPinkerton, Debra on 04/30/2015 09:54:59 [name], [name] ([id number]) -------------------------------------------------------------------------------- Fall Risk  Assessment Details Patient Name: [name], [name] Date of Service: 04/30/2015 9:30 AM Medical Record Number: [id number] Patient Account Number: [id number] Date of Birth/Sex: 09-Jul-1965 [age] y.o. Male) Treating RN: Phillis HaggisPinkerton, Debi Primary Care Physician: Azucena CecilSWAYNE, DAVID Other Clinician: Referring Physician: Carlus PavlovGHERGHE, CRISTINA Treating Physician/Extender: Rudene ReBritto, Errol Weeks in Treatment: 0 Fall Risk Assessment Items Have you had 2 or more falls in the last 12 monthso 0 No Have you had any fall that resulted in injury in the last 12 monthso 0 No FALL RISK ASSESSMENT: History of falling - immediate or within 3 months 0 No Secondary diagnosis 15 Yes Ambulatory aid  None/bed rest/wheelchair/nurse 0 Yes Crutches/cane/walker 0 No Furniture 0 No IV Access/Saline Lock 0 No Gait/Training Normal/bed rest/immobile 0 No Weak 10 Yes Impaired 20 Yes Mental Status Oriented to own ability 0 Yes Electronic Signature(s) Signed: 05/04/2015 4:28:40 PM By: Alejandro Mulling Entered By: Alejandro Mulling on 04/30/2015 09:55:19 [name] ([id number]) -------------------------------------------------------------------------------- Foot Assessment Details Patient Name: [name], [name] Date of Service: 04/30/2015 9:30 AM Medical Record Number: [id number] Patient Account Number: [id number] Date of Birth/Sex: [birth year] (50 y.o. Male) Treating RN: Phillis Haggis Primary Care Physician: Azucena Cecil, DAVID Other Clinician: Referring Physician: Carlus Pavlov Treating Physician/Extender: Rudene Re in Treatment: 0 Foot Assessment Items Site Locations + = Sensation present, - = Sensation absent, C = Callus, U = Ulcer R = Redness, W = Warmth, M = Maceration, PU = Pre-ulcerative lesion F = Fissure, S = Swelling, D = Dryness Assessment Right: Left: Other Deformity: No No Prior Foot Ulcer: No No Prior Amputation: No No Charcot Joint: No No Ambulatory Status: Gait: Electronic Signature(s) Signed:  05/04/2015 4:28:40 PM By: Alejandro Mulling Entered By: Alejandro Mulling on 04/30/2015 09:56:04 [name] ([id number]) -------------------------------------------------------------------------------- Nutrition Risk Assessment Details Patient Name: [name], [name] Date of Service: 04/30/2015 9:30 AM Medical Record Number: [id number] Patient Account Number: [id number] Date of Birth/Sex: 06-26-1965 (50 y.o. Male) Treating RN: Phillis Haggis Primary Care Physician: Azucena Cecil, DAVID Other Clinician: Referring Physician: Carlus Pavlov Treating Physician/Extender: Rudene Re in Treatment: 0 Height (in): 65 Weight (lbs): Body Mass Index (BMI): Nutrition Risk Assessment Items NUTRITION RISK SCREEN: I have an illness or condition that made me change the kind and/or 2 Yes amount of food I eat I eat fewer than two meals per day 3 Yes I eat few fruits and vegetables, or milk products 0 No I have three or more drinks of beer, liquor or wine almost every day 0 No I have tooth or mouth problems that make it hard for me to eat 0 No I don't always have enough money to buy the food I need 0 No I eat alone most of the time 0 No I take three or more different prescribed or over-the-counter drugs a 1 Yes day Without wanting to, I have lost or gained 10 pounds in the last six 2 Yes months I am not always physically able to shop, cook and/or feed myself 2 Yes Nutrition Protocols Good Risk Protocol Moderate Risk Protocol Electronic Signature(s) Signed: 05/04/2015 4:28:40 PM By: Alejandro Mulling Entered By: Alejandro Mulling on 04/30/2015 09:55:57

## 2015-05-14 ENCOUNTER — Encounter: Payer: Medicare Other | Attending: General Surgery | Admitting: General Surgery

## 2015-05-14 DIAGNOSIS — G710 Muscular dystrophy: Secondary | ICD-10-CM | POA: Insufficient documentation

## 2015-05-14 DIAGNOSIS — E663 Overweight: Secondary | ICD-10-CM | POA: Insufficient documentation

## 2015-05-14 DIAGNOSIS — E7800 Pure hypercholesterolemia, unspecified: Secondary | ICD-10-CM | POA: Diagnosis not present

## 2015-05-14 DIAGNOSIS — Z794 Long term (current) use of insulin: Secondary | ICD-10-CM | POA: Insufficient documentation

## 2015-05-14 DIAGNOSIS — K219 Gastro-esophageal reflux disease without esophagitis: Secondary | ICD-10-CM | POA: Diagnosis not present

## 2015-05-14 DIAGNOSIS — G1220 Motor neuron disease, unspecified: Secondary | ICD-10-CM | POA: Diagnosis not present

## 2015-05-14 DIAGNOSIS — L89321 Pressure ulcer of left buttock, stage 1: Secondary | ICD-10-CM | POA: Diagnosis not present

## 2015-05-14 DIAGNOSIS — Z87891 Personal history of nicotine dependence: Secondary | ICD-10-CM | POA: Insufficient documentation

## 2015-05-14 DIAGNOSIS — L89312 Pressure ulcer of right buttock, stage 2: Secondary | ICD-10-CM | POA: Diagnosis not present

## 2015-05-14 DIAGNOSIS — Z880 Allergy status to penicillin: Secondary | ICD-10-CM | POA: Insufficient documentation

## 2015-05-14 DIAGNOSIS — E11622 Type 2 diabetes mellitus with other skin ulcer: Secondary | ICD-10-CM | POA: Diagnosis present

## 2015-05-14 DIAGNOSIS — F419 Anxiety disorder, unspecified: Secondary | ICD-10-CM | POA: Diagnosis not present

## 2015-05-14 NOTE — Progress Notes (Signed)
See I heal note 

## 2015-05-18 ENCOUNTER — Other Ambulatory Visit: Payer: Self-pay | Admitting: Internal Medicine

## 2015-05-19 NOTE — Progress Notes (Addendum)
[name], PFARR ([id number]) Visit Report for 05/14/2015 Chief Complaint Document Details Patient Name: [name], [name] Date of Service: 05/14/2015 12:45 PM Medical Record Number: [id number] Patient Account Number: [id number] Date of Birth/Sex: 1965/10/09 (50 y.o. Male) Treating RN: Phillis Haggis Primary Care Physician: Azucena Cecil, DAVID Other Clinician: Referring Physician: Azucena Cecil, DAVID Treating Physician/Extender: Rudene Re in Treatment: 2 Information Obtained from: Patient Chief Complaint Patient is at the clinic for treatment of an open pressure ulcer to the left thigh buttock and scrotal area for about 4 weeks Electronic Signature(s) Signed: 05/14/2015 12:30:06 PM By: Ardath Sax Best Signed: 05/19/2015 7:51:43 AM By: Evlyn Kanner Best, FACS Entered By: Ardath Sax on 05/14/2015 12:30:06 [name] ([id number]) -------------------------------------------------------------------------------- HPI Details Patient Name: [name], [name] Date of Service: 05/14/2015 12:45 PM Medical Record Number: [id number] Patient Account Number: [id number] Date of Birth/Sex: 1965-03-29 (49 y.o. Male) Treating RN: Phillis Haggis Primary Care Physician: Azucena Cecil, DAVID Other Clinician: Referring Physician: Azucena Cecil, DAVID Treating Physician/Extender: Rudene Re in Treatment: 2 History of Present Illness Location: pain and ulceration in the area of his left upper thigh and buttock and in the region of his scrotum Quality: Patient reports experiencing a dull pain to affected area(s). Severity: Patient states wound are getting worse. Duration: Patient has had the wound for < 4 weeks prior to presenting for treatment Timing: Pain in wound is Intermittent (comes and goes Context: The wound would happen gradually Modifying Factors: Other treatment(s) tried include:offloading cushion for his wheelchair and appropriate mattress Associated Signs and Symptoms: Patient reports having:in  the sitting position for 10-12 hours a day in his wheelchair and the rest of the time in bed HPI Description: 50 year old male with a history of diabetes mellitus type 2 diagnosed in 2003, insulin- dependent, uncontrolled without complications. 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 hemoglobin A1c in January 2017 was 6.3% past medical history significant for diabetes mellitus type 2, GERD, muscular dystrophy and has been a former smoker. during his last visit to his endocrinologist he was diagnosed with a possible rash a injury to his left hip and hence referred to the wound center. The patient has not been a smoker for several years and though his swallowing is difficult he still eating food to the Best of his ability. he has bladder and bowel control. Electronic Signature(s) Signed: 05/14/2015 12:31:03 PM By: Ardath Sax Best Signed: 05/19/2015 7:51:43 AM By: Evlyn Kanner Best, FACS Entered By: Ardath Sax on 05/14/2015 12:31:03 [name] ([id number]) -------------------------------------------------------------------------------- Physical Exam Details Patient Name: [name], [name] Date of Service: 05/14/2015 12:45 PM Medical Record Number: [id number] Patient Account Number: [id number] Date of Birth/Sex: Jun 12, 1965 (50 y.o. Male) Treating RN: Phillis Haggis Primary Care Physician: Azucena Cecil, DAVID Other Clinician: Referring Physician: Azucena Cecil, DAVID Treating Physician/Extender: Rudene Re in Treatment: 2 Electronic Signature(s) Signed: 05/14/2015 12:31:12 PM By: Ardath Sax Best Signed: 05/19/2015 7:51:43 AM By: Evlyn Kanner Best, FACS Entered By: Ardath Sax on 05/14/2015 12:31:12 [name] ([id number]) -------------------------------------------------------------------------------- Physician Orders Details Patient Name: [name], [name] Date of Service: 05/14/2015 12:45  PM Medical Record Number: [id number] Patient Account Number: [id number] Date of Birth/Sex: 28-May-1965 (50 y.o. Male) Treating RN: Phillis Haggis Primary Care Physician: Azucena Cecil, DAVID Other Clinician: Referring Physician: Azucena Cecil, DAVID Treating Physician/Extender: Elayne Snare in Treatment: 2 Verbal / Phone Orders: Yes ClinicianAshok Cordia, Best Read Back and Verified: Yes Diagnosis Coding ICD-10 Coding Code  Description G12.20 Motor neuron disease, unspecified E11.622 Type 2 diabetes mellitus with other skin ulcer L89.321 Pressure ulcer of left buttock, stage 1 E66.3 Overweight Wound Cleansing Wound #1 Left,Posterior Upper Leg o Clean wound with Normal Saline. Anesthetic Wound #1 Left,Posterior Upper Leg o Topical Lidocaine 4% cream applied to wound bed prior to debridement Skin Barriers/Peri-Wound Care Wound #1 Left,Posterior Upper Leg o Skin Prep Secondary Dressing Wound #1 Left,Posterior Upper Leg o Boardered Foam Dressing Dressing Change Frequency Wound #1 Left,Posterior Upper Leg o Change dressing every other day. Follow-up Appointments Wound #1 Left,Posterior Upper Leg o Return Appointment in 2 weeks. Off-Loading Wound #1 Left,Posterior Upper Leg o Turn and reposition every 2 hours Georgiades, Aeden A. ([id number]) o Other: - check and see what kind of mattress you have Additional Orders / Instructions Wound #1 Left,Posterior Upper Leg o Increase protein intake. Medications-please add to medication list. Wound #1 Left,Posterior Upper Leg o Other: - Vitamin A, Vitamin C, Zinc, Multivitamin Electronic Signature(s) Signed: 05/14/2015 1:16:43 PM By: Alejandro MullingPinkerton, Debra Signed: 05/17/2015 3:38:49 PM By: Ardath SaxParker, Valarie Farace Best Entered By: Alejandro MullingPinkerton, Debra on 05/14/2015 13:09:05 Andre, Worthy [name] ([id number]) -------------------------------------------------------------------------------- Problem List Details Patient Name: [name], [name] Date of  Service: 05/14/2015 12:45 PM Medical Record Number: [id number] Patient Account Number: [id number] Date of Birth/Sex: August 14, 1965 [age] y.o. Male) Treating RN: Phillis HaggisPinkerton, Best Primary Care Physician: Azucena CecilSWAYNE, DAVID Other Clinician: Referring Physician: Azucena CecilSWAYNE, DAVID Treating Physician/Extender: Rudene ReBritto, Errol Weeks in Treatment: 2 Active Problems ICD-10 Encounter Code Description Active Date Diagnosis G12.20 Motor neuron disease, unspecified 04/30/2015 Yes E11.622 Type 2 diabetes mellitus with other skin ulcer 04/30/2015 Yes L89.321 Pressure ulcer of left buttock, stage 1 04/30/2015 Yes E66.3 Overweight 04/30/2015 Yes Inactive Problems Resolved Problems Electronic Signature(s) Signed: 05/14/2015 12:29:53 PM By: Ardath SaxParker, Kalia Vahey Best Signed: 05/19/2015 7:51:43 AM By: Evlyn KannerBritto, Errol Best, FACS Entered By: Ardath SaxParker, Angelis Gates on 05/14/2015 12:29:53 Lembcke, Worthy [name] ([id number]) -------------------------------------------------------------------------------- Progress Note Details Patient Name: [name], [name] Date of Service: 05/14/2015 12:45 PM Medical Record Number: [id number] Patient Account Number: [id number] Date of Birth/Sex: August 14, 1965 [age] y.o. Male) Treating RN: Phillis HaggisPinkerton, Best Primary Care Physician: Azucena CecilSWAYNE, DAVID Other Clinician: Referring Physician: Azucena CecilSWAYNE, DAVID Treating Physician/Extender: Rudene ReBritto, Errol Weeks in Treatment: 2 Subjective Chief Complaint Information obtained from Patient Patient is at the clinic for treatment of an open pressure ulcer to the left thigh buttock and scrotal area for about 4 weeks History of Present Illness (HPI) The following HPI elements were documented for the patient's wound: Location: pain and ulceration in the area of his left upper thigh and buttock and in the region of his scrotum Quality: Patient reports experiencing a dull pain to affected area(s). Severity: Patient states wound are getting worse. Duration: Patient has had the wound for < 4 weeks prior to  presenting for treatment Timing: Pain in wound is Intermittent (comes and goes Context: The wound would happen gradually Modifying Factors: Other treatment(s) tried include:offloading cushion for his wheelchair and appropriate mattress Associated Signs and Symptoms: Patient reports having:in the sitting position for 10-12 hours a day in his wheelchair and the rest of the time in bed 50 year old male with a history of diabetes mellitus type 2 diagnosed in 2003, insulin-dependent, uncontrolled without complications. 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 hemoglobin A1c in January 2017 was 6.3% past medical history significant for diabetes mellitus type 2, GERD, muscular dystrophy and has been a former smoker. during his  last visit to his endocrinologist he was diagnosed with a possible rash a injury to his left hip and hence referred to the wound center. The patient has not been a smoker for several years and though his swallowing is difficult he still eating food to the Best of his ability. he has bladder and bowel control. Objective Constitutional Vitals Time Taken: 12:32 PM, Height: 65 in, Temperature: 98.4 F, Pulse: 101 bpm, Respiratory Rate: 18 Menken, Giani A. ([id number]) breaths/min, Blood Pressure: 170/90 mmHg. Integumentary (Hair, Skin) Wound #1 status is Open. Original cause of wound was Gradually Appeared. The wound is located on the Left,Posterior Upper Leg. The wound measures 0.1cm length x 0.1cm width x 0.1cm depth; 0.008cm^2 area and 0.001cm^3 volume. The wound is limited to skin breakdown. There is no tunneling or undermining noted. There is a large amount of serous drainage noted. The wound margin is flat and intact. There is large (67-100%) pink granulation within the wound bed. There is a small (1-33%) amount of necrotic tissue within the wound bed including Adherent Slough. The  periwound skin appearance exhibited: Dry/Scaly. Periwound temperature was noted as No Abnormality. The periwound has tenderness on palpation. Assessment Active Problems ICD-10 G12.20 - Motor neuron disease, unspecified E11.622 - Type 2 diabetes mellitus with other skin ulcer L89.321 - Pressure ulcer of left buttock, stage 1 E66.3 - Overweight Plan Wound Cleansing: Wound #1 Left,Posterior Upper Leg: Clean wound with Normal Saline. Anesthetic: Wound #1 Left,Posterior Upper Leg: Topical Lidocaine 4% cream applied to wound bed prior to debridement Skin Barriers/Peri-Wound Care: Wound #1 Left,Posterior Upper Leg: Skin Prep Secondary Dressing: Wound #1 Left,Posterior Upper Leg: Boardered Foam Dressing Dressing Change Frequency: Wound #1 Left,Posterior Upper Leg: Change dressing every other day. Follow-up Appointments: Wound #1 Left,Posterior Upper Leg: Return Appointment in 2 weeks. Gundry, MALIKYE REPPOND ([id number]) Off-Loading: Wound #1 Left,Posterior Upper Leg: Turn and reposition every 2 hours Other: - check and see what kind of mattress you have Additional Orders / Instructions: Wound #1 Left,Posterior Upper Leg: Increase protein intake. Medications-please add to medication list.: Wound #1 Left,Posterior Upper Leg: Other: - Vitamin A, Vitamin C, Zinc, Multivitamin Some pressure erythema left buttucks but much improved. Continue foam and add moisturizing cream to skin. RTC 2 weeks. Change dressing every 3 days at home. Electronic Signature(s) Signed: 05/19/2015 7:53:20 AM By: Evlyn Kanner Best, FACS Previous Signature: 05/14/2015 12:45:56 PM Version By: Ardath Sax Best Previous Signature: 05/19/2015 7:51:43 AM Version By: Evlyn Kanner Best, FACS Entered By: Evlyn Kanner on 05/19/2015 07:53:20 Locastro, Worthy Best ([id number]) -------------------------------------------------------------------------------- SuperBill Details Patient Name: [name], [name] Date of Service:  05/14/2015 Medical Record Number: [id number] Patient Account Number: [id number] Date of Birth/Sex: Nov 14, 1965 (50 y.o. Male) Treating RN: Phillis Haggis Primary Care Physician: Azucena Cecil, DAVID Other Clinician: Referring Physician: Azucena Cecil, DAVID Treating Physician/Extender: Elayne Snare in Treatment: 2 Diagnosis Coding ICD-10 Codes Code Description G12.20 Motor neuron disease, unspecified E11.622 Type 2 diabetes mellitus with other skin ulcer L89.321 Pressure ulcer of left buttock, stage 1 E66.3 Overweight Facility Procedures CPT4 Code: 62952841 Description: 99213 - WOUND CARE VISIT-LEV 3 EST PT Modifier: Quantity: 1 Physician Procedures CPT4 Code: 3244010 Description: 99213 - WC PHYS LEVEL 3 - EST PT ICD-10 Description Diagnosis L89.321 Pressure ulcer of left buttock, stage 1 Modifier: Quantity: 1 Electronic Signature(s) Signed: 05/17/2015 3:38:49 PM By: Ardath Sax Best Previous Signature: 05/14/2015 1:16:43 PM Version By: Alejandro Mulling Previous Signature: 05/14/2015 12:46:29 PM Version By: Ardath Sax Best Entered By: Francie Massing on 05/17/2015 15:38:08

## 2015-05-19 NOTE — Progress Notes (Signed)
ALVEN, ALVERIO ([id number]) Visit Report for 05/14/2015 Arrival Information Details Patient Name: Rotenberg, LISTER BRIZZI. Date of Service: 05/14/2015 12:45 PM Medical Record Number: [id number] Patient Account Number: [id number] Date of Birth/Sex: [birth year] (50 y.o. Male) Treating RN: Phillis Haggis Primary Care Physician: Azucena Cecil, DAVID Other Clinician: Referring Physician: Azucena Cecil, DAVID Treating Physician/Extender: Rudene Re in Treatment: 2 Visit Information History Since Last Visit All ordered tests and consults were completed: No Patient Arrived: Wheel Chair Added or deleted any medications: No Arrival Time: 12:31 Any new allergies or adverse reactions: No Accompanied By: mother Had a fall or experienced change in No activities of daily living that may affect Transfer Assistance: Michiel Sites Lift risk of falls: Patient Identification Verified: Yes Signs or symptoms of abuse/neglect since last No Secondary Verification Process Yes visito Completed: Hospitalized since last visit: No Patient Requires Transmission-Based No Pain Present Now: No Precautions: Patient Has Alerts: Yes Patient Alerts: DM II Electronic Signature(s) Signed: 05/14/2015 1:16:43 PM By: Alejandro Mulling Entered By: Alejandro Mulling on 05/14/2015 12:32:22 Shreiner, [name] -------------------------------------------------------------------------------- Clinic Level of Care Assessment Details Patient Name: Farha, Jaiveer A. Date of Service: 05/14/2015 12:45 PM Medical Record Number: [id number] Patient Account Number: [id number] Date of Birth/Sex: 02-17-1965 (50 y.o. Male) Treating RN: Phillis Haggis Primary Care Physician: Azucena Cecil, DAVID Other Clinician: Referring Physician: Azucena Cecil, DAVID Treating Physician/Extender: Elayne Snare in Treatment: 2 Clinic Level of Care Assessment Items TOOL 4 Quantity Score  - Use when only an EandM is performed on FOLLOW-UP visit 0 ASSESSMENTS - Nursing  Assessment / Reassessment  - Reassessment of Co-morbidities (includes updates in patient status) 0 X - Reassessment of Adherence to Treatment Plan 1 5 ASSESSMENTS - Wound and Skin Assessment / Reassessment X - Simple Wound Assessment / Reassessment - one wound 1 5  - Complex Wound Assessment / Reassessment - multiple wounds 0  - Dermatologic / Skin Assessment (not related to wound area) 0 ASSESSMENTS - Focused Assessment  - Circumferential Edema Measurements - multi extremities 0  - Nutritional Assessment / Counseling / Intervention 0  - Lower Extremity Assessment (monofilament, tuning fork, pulses) 0  - Peripheral Arterial Disease Assessment (using hand held doppler) 0 ASSESSMENTS - Ostomy and/or Continence Assessment and Care  - Incontinence Assessment and Management 0  - Ostomy Care Assessment and Management (repouching, etc.) 0 PROCESS - Coordination of Care  - Simple Patient / Family Education for ongoing care 0 X - Complex (extensive) Patient / Family Education for ongoing care 1 20  - Staff obtains Chiropractor, Records, Test Results / Process Orders 0  - Staff telephones HHA, Nursing Homes / Clarify orders / etc 0  - Routine Transfer to another Facility (non-emergent condition) 0 Thier, CARSTON RIEDL ([id number])  - Routine Hospital Admission (non-emergent condition) 0  - New Admissions / Manufacturing engineer / Ordering NPWT, Apligraf, etc. 0  - Emergency Hospital Admission (emergent condition) 0 X - Simple Discharge Coordination 1 10  - Complex (extensive) Discharge Coordination 0 PROCESS - Special Needs  - Pediatric / Minor Patient Management 0  - Isolation Patient Management 0  - Hearing / Language / Visual special needs 0  - Assessment of Community assistance (transportation, D/C planning, etc.) 0  - Additional assistance / Altered mentation 0  - Support Surface(s) Assessment (bed, cushion, seat, etc.) 0 INTERVENTIONS - Wound  Cleansing / Measurement X - Simple Wound Cleansing - one wound 1 5  - Complex Wound Cleansing - multiple wounds 0 X - Wound Imaging (photographs - any number of  wounds) 1 5 []  - Wound Tracing (instead of photographs) 0 X - Simple Wound Measurement - one wound 1 5 []  - Complex Wound Measurement - multiple wounds 0 INTERVENTIONS - Wound Dressings []  - Small Wound Dressing one or multiple wounds 0 X - Medium Wound Dressing one or multiple wounds 1 15 []  - Large Wound Dressing one or multiple wounds 0 []  - Application of Medications - topical 0 []  - Application of Medications - injection 0 INTERVENTIONS - Miscellaneous []  - External ear exam 0 Trang, Sierra A. ([id number]) []  - Specimen Collection (cultures, biopsies, blood, body fluids, etc.) 0 []  - Specimen(s) / Culture(s) sent or taken to Lab for analysis 0 X - Patient Transfer (multiple staff / Michiel SitesHoyer Lift / Similar devices) 1 10 []  - Simple Staple / Suture removal (25 or less) 0 []  - Complex Staple / Suture removal (26 or more) 0 []  - Hypo / Hyperglycemic Management (close monitor of Blood Glucose) 0 []  - Ankle / Brachial Index (ABI) - do not check if billed separately 0 X - Vital Signs 1 5 Has the patient been seen at the hospital within the last three years: Yes Total Score: 85 Level Of Care: New/Established - Level 3 Electronic Signature(s) Signed: 05/14/2015 1:16:43 PM By: Alejandro MullingPinkerton, Debra Entered By: Alejandro MullingPinkerton, Debra on 05/14/2015 13:10:02 Magaw, Worthy [name] ([id number]) -------------------------------------------------------------------------------- Encounter Discharge Information Details Patient Name: Kocsis, Sidharth A. Date of Service: 05/14/2015 12:45 PM Medical Record Number: [id number] Patient Account Number: [id number] Date of Birth/Sex: Sep 26, 1965 [age] y.o. Male) Treating RN: Phillis HaggisPinkerton, Debi Primary Care Physician: Azucena CecilSWAYNE, DAVID Other Clinician: Referring Physician: Azucena CecilSWAYNE, DAVID Treating Physician/Extender: Elayne SnarePARKER,  PETER Weeks in Treatment: 2 Encounter Discharge Information Items Discharge Pain Level: 0 Discharge Condition: Stable Ambulatory Status: Wheelchair Discharge Destination: Home Transportation: Private Auto Accompanied By: mother Schedule Follow-up Appointment: Yes Medication Reconciliation completed and provided to Patient/Care Yes Treanna Dumler: Provided on Clinical Summary of Care: 05/14/2015 Form Type Recipient Paper Patient PM Electronic Signature(s) Signed: 05/14/2015 1:16:43 PM By: Alejandro MullingPinkerton, Debra Previous Signature: 05/14/2015 12:54:11 PM Version By: Gwenlyn PerkingMoore, Shelia Previous Signature: 05/14/2015 12:47:03 PM Version By: Ardath SaxParker, Peter MD Entered By: Alejandro MullingPinkerton, Debra on 05/14/2015 13:10:49 Stoll, Worthy [name] ([id number]) -------------------------------------------------------------------------------- Lower Extremity Assessment Details Patient Name: Mclin, Lotus A. Date of Service: 05/14/2015 12:45 PM Medical Record Number: [id number] Patient Account Number: [id number] Date of Birth/Sex: Sep 26, 1965 [age] y.o. Male) Treating RN: Phillis HaggisPinkerton, Debi Primary Care Physician: Azucena CecilSWAYNE, DAVID Other Clinician: Referring Physician: Azucena CecilSWAYNE, DAVID Treating Physician/Extender: Rudene ReBritto, Errol Weeks in Treatment: 2 Electronic Signature(s) Signed: 05/14/2015 1:16:43 PM By: Alejandro MullingPinkerton, Debra Entered By: Alejandro MullingPinkerton, Debra on 05/14/2015 12:33:31 Okuda, Worthy [name] ([id number]) -------------------------------------------------------------------------------- Multi Wound Chart Details Patient Name: Yum, Tu A. Date of Service: 05/14/2015 12:45 PM Medical Record Number: [id number] Patient Account Number: [id number] Date of Birth/Sex: Sep 26, 1965 [age] y.o. Male) Treating RN: Phillis HaggisPinkerton, Debi Primary Care Physician: Azucena CecilSWAYNE, DAVID Other Clinician: Referring Physician: Azucena CecilSWAYNE, DAVID Treating Physician/Extender: Elayne SnarePARKER, PETER Weeks in Treatment: 2 Vital Signs Height(in): 65 Pulse(bpm): 101 Weight(lbs): Blood  Pressure 170/90 (mmHg): Body Mass Index(BMI): Temperature(F): 98.4 Respiratory Rate 18 (breaths/min): Photos: [1:No Photos] [N/A:N/A] Wound Location: [1:Left Upper Leg - Posterior] [N/A:N/A] Wounding Event: [1:Gradually Appeared] [N/A:N/A] Primary Etiology: [1:Pressure Ulcer] [N/A:N/A] Comorbid History: [1:Type II Diabetes] [N/A:N/A] Date Acquired: [1:03/30/2015] [N/A:N/A] Weeks of Treatment: [1:2] [N/A:N/A] Wound Status: [1:Open] [N/A:N/A] Measurements L x W x D 0.1x0.1x0.1 [N/A:N/A] (cm) Area (cm) : [1:0.008] [N/A:N/A] Volume (cm) : [1:0.001] [N/A:N/A] % Reduction in Area: [1:100.00%] [N/A:N/A] % Reduction in Volume: 100.00% [N/A:N/A] Classification: [1:Category/Stage II] [N/A:N/A] HBO  Classification: [1:Grade 1] [N/A:N/A] Exudate Amount: [1:Large] [N/A:N/A] Exudate Type: [1:Serous] [N/A:N/A] Exudate Color: [1:amber] [N/A:N/A] Wound Margin: [1:Flat and Intact] [N/A:N/A] Granulation Amount: [1:Large (67-100%)] [N/A:N/A] Granulation Quality: [1:Pink] [N/A:N/A] Necrotic Amount: [1:Small (1-33%)] [N/A:N/A] Exposed Structures: [1:Fascia: No Fat: No Tendon: No Muscle: No Joint: No Bone: No] [N/A:N/A] Limited to Skin Breakdown Epithelialization: None N/A N/A Periwound Skin Texture: No Abnormalities Noted N/A N/A Periwound Skin Dry/Scaly: Yes N/A N/A Moisture: Periwound Skin Color: No Abnormalities Noted N/A N/A Temperature: No Abnormality N/A N/A Tenderness on Yes N/A N/A Palpation: Wound Preparation: Ulcer Cleansing: N/A N/A Rinsed/Irrigated with Saline Topical Anesthetic Applied: Other: lidocaine 4% Treatment Notes Electronic Signature(s) Signed: 05/14/2015 1:16:43 PM By: Alejandro Mulling Entered By: Alejandro Mulling on 05/14/2015 13:08:37 Kliewer, [name] ([id number]) -------------------------------------------------------------------------------- Multi-Disciplinary Care Plan Details Patient Name: Ovens, Maggie A. Date of Service: 05/14/2015 12:45 PM Medical  Record Number: [id number] Patient Account Number: [id number] Date of Birth/Sex: 09/09/1965 (50 y.o. Male) Treating RN: Phillis Haggis Primary Care Physician: Azucena Cecil, DAVID Other Clinician: Referring Physician: Azucena Cecil, DAVID Treating Physician/Extender: Elayne Snare in Treatment: 2 Active Inactive Abuse / Safety / Falls / Self Care Management Nursing Diagnoses: Potential for falls Goals: Patient will remain injury free Date Initiated: 04/30/2015 Goal Status: Active Interventions: Assess fall risk on admission and as needed Assess impairment of mobility on admission and as needed per policy Notes: Nutrition Nursing Diagnoses: Imbalanced nutrition Impaired glucose control: actual or potential Goals: Patient/caregiver agrees to and verbalizes understanding of need to use nutritional supplements and/or vitamins as prescribed Date Initiated: 04/30/2015 Goal Status: Active Interventions: Assess patient nutrition upon admission and as needed per policy Notes: Orientation to the Wound Care Program Nursing Diagnoses: Knowledge deficit related to the wound healing center program Parlier, TAVORIS BRISK ([id number]) Goals: Patient/caregiver will verbalize understanding of the Wound Healing Center Program Date Initiated: 04/30/2015 Goal Status: Active Interventions: Provide education on orientation to the wound center Notes: Pain, Acute or Chronic Nursing Diagnoses: Pain, acute or chronic: actual or potential Potential alteration in comfort, pain Goals: Patient will verbalize adequate pain control and receive pain control interventions during procedures as needed Date Initiated: 04/30/2015 Goal Status: Active Interventions: Assess comfort goal upon admission Complete pain assessment as per visit requirements Notes: Pressure Nursing Diagnoses: Knowledge deficit related to causes and risk factors for pressure ulcer development Goals: Patient will remain free from development of  additional pressure ulcers Date Initiated: 04/30/2015 Goal Status: Active Interventions: Assess: immobility, friction, shearing, incontinence upon admission and as needed Assess offloading mechanisms upon admission and as needed Notes: Wound/Skin Impairment Nursing Diagnoses: Wilkinson, Denorris A. ([id number]) Impaired tissue integrity Goals: Ulcer/skin breakdown will have a volume reduction of 30% by week 4 Date Initiated: 04/30/2015 Goal Status: Active Ulcer/skin breakdown will have a volume reduction of 50% by week 8 Date Initiated: 04/30/2015 Goal Status: Active Ulcer/skin breakdown will have a volume reduction of 80% by week 12 Date Initiated: 04/30/2015 Goal Status: Active Interventions: Assess patient/caregiver ability to obtain necessary supplies Assess ulceration(s) every visit Notes: Electronic Signature(s) Signed: 05/14/2015 1:16:43 PM By: Alejandro Mulling Entered By: Alejandro Mulling on 05/14/2015 13:08:30 Yoakum, [name] ([id number]) -------------------------------------------------------------------------------- Pain Assessment Details Patient Name: Gabrielle, Kellon A. Date of Service: 05/14/2015 12:45 PM Medical Record Number: [id number] Patient Account Number: [id number] Date of Birth/Sex: 01-28-1966 (50 y.o. Male) Treating RN: Phillis Haggis Primary Care Physician: Azucena Cecil, DAVID Other Clinician: Referring Physician: Azucena Cecil, DAVID Treating Physician/Extender: Rudene Re in Treatment: 2 Active Problems Location of Pain Severity and Description of Pain Patient Has Paino  No Site Locations Pain Management and Medication Current Pain Management: Electronic Signature(s) Signed: 05/14/2015 1:16:43 PM By: Alejandro Mulling Entered By: Alejandro Mulling on 05/14/2015 12:32:29 Gilliam, [name] ([id number]) -------------------------------------------------------------------------------- Patient/Caregiver Education Details Patient Name: Golubski, Kincade A. Date of Service:  05/14/2015 12:45 PM Medical Record Number: [id number] Patient Account Number: [id number] Date of Birth/Gender: 1965-11-02 (50 y.o. Male) Treating RN: Phillis Haggis Primary Care Physician: Azucena Cecil, DAVID Other Clinician: Referring Physician: Azucena Cecil, DAVID Treating Physician/Extender: Elayne Snare in Treatment: 2 Education Assessment Education Provided To: Patient and Caregiver Education Topics Provided Wound/Skin Impairment: Handouts: Other: change dressing as ordered Methods: Demonstration, Explain/Verbal Responses: State content correctly Electronic Signature(s) Signed: 05/14/2015 1:16:43 PM By: Alejandro Mulling Previous Signature: 05/14/2015 12:47:16 PM Version By: Ardath Sax MD Entered By: Alejandro Mulling on 05/14/2015 13:11:01 Bedoy, [name] -------------------------------------------------------------------------------- Wound Assessment Details Patient Name: Kaley, Johnie A. Date of Service: 05/14/2015 12:45 PM Medical Record Number: [id number] Patient Account Number: [id number] Date of Birth/Sex: Sep 26, 1965 (50 y.o. Male) Treating RN: Phillis Haggis Primary Care Physician: Azucena Cecil, DAVID Other Clinician: Referring Physician: Azucena Cecil, DAVID Treating Physician/Extender: Rudene Re in Treatment: 2 Wound Status Wound Number: 1 Primary Etiology: Pressure Ulcer Wound Location: Left Upper Leg - Posterior Wound Status: Open Wounding Event: Gradually Appeared Comorbid History: Type II Diabetes Date Acquired: 03/30/2015 Weeks Of Treatment: 2 Clustered Wound: No Photos Photo Uploaded By: Alejandro Mulling on 05/14/2015 13:14:05 Wound Measurements Length: (cm) 0.1 Width: (cm) 0.1 Depth: (cm) 0.1 Area: (cm) 0.008 Volume: (cm) 0.001 % Reduction in Area: 100% % Reduction in Volume: 100% Epithelialization: None Tunneling: No Undermining: No Wound Description Classification: Category/Stage II Diabetic Severity [address]): Grade 1 Wound  Margin: Flat and Intact Exudate Amount: Large Exudate Type: Serous Exudate Color: amber Foul Odor After Cleansing: No Wound Bed Granulation Amount: Large (67-100%) Exposed Structure Granulation Quality: Pink Fascia Exposed: No Necrotic Amount: Small (1-33%) Fat Layer Exposed: No Ardila, Skye AMarland Kitchen ([id number]) Necrotic Quality: Adherent Slough Tendon Exposed: No Muscle Exposed: No Joint Exposed: No Bone Exposed: No Limited to Skin Breakdown Periwound Skin Texture Texture Color No Abnormalities Noted: No No Abnormalities Noted: No Moisture Temperature / Pain No Abnormalities Noted: No Temperature: No Abnormality Dry / Scaly: Yes Tenderness on Palpation: Yes Wound Preparation Ulcer Cleansing: Rinsed/Irrigated with Saline Topical Anesthetic Applied: Other: lidocaine 4%, Treatment Notes Wound #1 (Left, Posterior Upper Leg) 1. Cleansed with: Clean wound with Normal Saline 3. Peri-wound Care: Skin Prep 5. Secondary Dressing Applied Bordered Foam Dressing Electronic Signature(s) Signed: 05/14/2015 1:16:43 PM By: Alejandro Mulling Entered By: Alejandro Mulling on 05/14/2015 12:51:59 Chizmar, [name] -------------------------------------------------------------------------------- Vitals Details Patient Name: Zipper, Beverly A. Date of Service: 05/14/2015 12:45 PM Medical Record Number: [id number] Patient Account Number: [id number] Date of Birth/Sex: [birth year] (50 y.o. Male) Treating RN: Phillis Haggis Primary Care Physician: Azucena Cecil, DAVID Other Clinician: Referring Physician: Azucena Cecil, DAVID Treating Physician/Extender: Rudene Re in Treatment: 2 Vital Signs Time Taken: 12:32 Temperature (F): 98.4 Height (in): 65 Pulse (bpm): 101 Respiratory Rate (breaths/min): 18 Blood Pressure (mmHg): 170/90 Reference Range: 80 - 120 mg / dl Electronic Signature(s) Signed: 05/14/2015 1:16:43 PM By: Alejandro Mulling Entered By: Alejandro Mulling on 05/14/2015  12:33:25

## 2015-05-28 ENCOUNTER — Encounter: Payer: Medicare Other | Admitting: Surgery

## 2015-05-28 ENCOUNTER — Other Ambulatory Visit: Payer: Self-pay

## 2015-05-28 ENCOUNTER — Emergency Department (HOSPITAL_COMMUNITY)
Admission: EM | Admit: 2015-05-28 | Discharge: 2015-05-28 | Disposition: A | Discharge status: Home / Self Care | Payer: Medicare Other | Attending: Emergency Medicine | Admitting: Emergency Medicine

## 2015-05-28 ENCOUNTER — Ambulatory Visit: Payer: Medicare Other | Admitting: Internal Medicine

## 2015-05-28 ENCOUNTER — Encounter (HOSPITAL_COMMUNITY): Payer: Self-pay | Admitting: *Deleted

## 2015-05-28 DIAGNOSIS — G710 Muscular dystrophy: Secondary | ICD-10-CM | POA: Insufficient documentation

## 2015-05-28 DIAGNOSIS — Z79891 Long term (current) use of opiate analgesic: Secondary | ICD-10-CM | POA: Insufficient documentation

## 2015-05-28 DIAGNOSIS — E119 Type 2 diabetes mellitus without complications: Secondary | ICD-10-CM | POA: Diagnosis not present

## 2015-05-28 DIAGNOSIS — Z794 Long term (current) use of insulin: Secondary | ICD-10-CM | POA: Diagnosis not present

## 2015-05-28 DIAGNOSIS — Z79899 Other long term (current) drug therapy: Secondary | ICD-10-CM | POA: Diagnosis not present

## 2015-05-28 DIAGNOSIS — R030 Elevated blood-pressure reading, without diagnosis of hypertension: Secondary | ICD-10-CM

## 2015-05-28 DIAGNOSIS — Z792 Long term (current) use of antibiotics: Secondary | ICD-10-CM | POA: Diagnosis not present

## 2015-05-28 DIAGNOSIS — E782 Mixed hyperlipidemia: Secondary | ICD-10-CM | POA: Diagnosis not present

## 2015-05-28 DIAGNOSIS — Z7984 Long term (current) use of oral hypoglycemic drugs: Secondary | ICD-10-CM | POA: Diagnosis not present

## 2015-05-28 DIAGNOSIS — R600 Localized edema: Secondary | ICD-10-CM | POA: Diagnosis not present

## 2015-05-28 DIAGNOSIS — Z87891 Personal history of nicotine dependence: Secondary | ICD-10-CM | POA: Insufficient documentation

## 2015-05-28 DIAGNOSIS — I10 Essential (primary) hypertension: Secondary | ICD-10-CM | POA: Insufficient documentation

## 2015-05-28 DIAGNOSIS — R609 Edema, unspecified: Secondary | ICD-10-CM

## 2015-05-28 DIAGNOSIS — E11622 Type 2 diabetes mellitus with other skin ulcer: Secondary | ICD-10-CM | POA: Diagnosis not present

## 2015-05-28 DIAGNOSIS — IMO0001 Reserved for inherently not codable concepts without codable children: Secondary | ICD-10-CM

## 2015-05-28 LAB — I-STAT CHEM 8, ED
BUN: 7 mg/dL (ref 6–20)
CHLORIDE: 94 mmol/L — AB (ref 101–111)
Calcium, Ion: 1.05 mmol/L — ABNORMAL LOW (ref 1.12–1.23)
GLUCOSE: 315 mg/dL — AB (ref 65–99)
HCT: 48 % (ref 39.0–52.0)
Hemoglobin: 16.3 g/dL (ref 13.0–17.0)
POTASSIUM: 3.3 mmol/L — AB (ref 3.5–5.1)
Sodium: 136 mmol/L (ref 135–145)
TCO2: 30 mmol/L (ref 0–100)

## 2015-05-28 MED ORDER — FUROSEMIDE 20 MG PO TABS: 20.0000 mg | ORAL_TABLET | Freq: Two times a day (BID) | ORAL | Status: DC

## 2015-05-28 MED ORDER — FUROSEMIDE 40 MG PO TABS
20.0000 mg | ORAL_TABLET | Freq: Once | ORAL | Status: AC
  Administered 2015-05-28: 20 mg via ORAL
  Filled 2015-05-28: qty 1

## 2015-05-28 NOTE — ED Notes (Signed)
Pt seen at wound care this am, pt states his blood pressure was high, doctor thinks his legs are swelling due to the HTN, +2-3 bil lower leg, ankle edema

## 2015-05-28 NOTE — ED Provider Notes (Signed)
CSN: [id number]     Arrival date & time 05/28/15  1418 History   First MD Initiated Contact with Patient 05/28/15 1519     Chief Complaint  Patient presents with  . Hypertension  . Leg Swelling     (Consider location/radiation/quality/duration/timing/severity/associated sxs/prior Treatment) HPI [name] is a 50 y.o. male history of muscular dystrophy, diabetes, comes in for evaluation of hypertension and leg swelling. Patient reports he was at wound care this morning and was noted to have a blood pressure of 195/115. He reports being asymptomatic at that time, but was instructed to come to the ED for further evaluation. He also reports he has had bilateral lower leg swelling over the past week and a half. Denies any fevers or chills, vision changes, headache, chest pain, shortness of breath, unusual cough, numbness or weakness. Also reports that he did not take his lisinopril before going to wound care this morning, but did take it before arriving in the emergency department. On arrival, blood pressure 169/95.  Past Medical History  Diagnosis Date  . Diabetes mellitus without complication (HCC)     DM Type II   . GERD (gastroesophageal reflux disease)   . Hypercholesterolemia   . Muscular dystrophy [place](HCC)    Past Surgical History  Procedure Laterality Date  . Muscle bisopy     Family History  Problem Relation Age of Onset  . Diabetes Mother   . Colon polyps Mother   . Heart disease Father     CAD  . Diabetes Father    Social History  Substance Use Topics  . Smoking status: Former Games developermoker  . Smokeless tobacco: Never Used  . Alcohol Use: No    Review of Systems A 10 point review of systems was completed and was negative except for pertinent positives and negatives as mentioned in the history of present illness     Allergies  Penicillins  Home Medications   Prior to Admission medications   Medication Sig Start Date End Date Taking? Authorizing Provider  [place]  1000 MG suppository Place 1,000 mg rectally at bedtime as needed (for ulcerative proctitis).  12/10/14  Yes Historical Provider, MD  glipiZIDE (GLUCOTROL XL) 5 MG 24 hr tablet TAKE ONE TABLET BY MOUTH EVERY MORNING WITH BREAKFAST 10/20/14  Yes Carlus Pavlovristina Gherghe, MD  HYDROcodone-acetaminophen (HYCET) 7.5-325 mg/15 ml solution Take 15 mLs by mouth at bedtime.    Yes Historical Provider, MD  lisinopril (PRINIVIL,ZESTRIL) 2.5 MG tablet Take 2.5 mg by mouth daily.    Yes Historical Provider, MD  potassium chloride 20 MEQ/15ML (10%) SOLN TAKE 1 TABLESPOONFUL (15 ML) ONCE A DAY ORALLY 03/11/15  Yes Historical Provider, MD  rosuvastatin (CRESTOR) 5 MG tablet Take 5 mg by mouth at bedtime.    Yes Historical Provider, MD  TOUJEO SOLOSTAR 300 UNIT/ML SOPN INJECT 34 UNITS INTO THE SKIN AT BEDTIME. 05/03/15  Yes Carlus Pavlovristina Gherghe, MD  TRULICITY 1.5 MG/0.5ML SOPN INJECT 1.5MG  INTO THE SKIN ONCE WEEKLY 05/18/15  Yes Carlus Pavlovristina Gherghe, MD  BD PEN NEEDLE NANO U/F 32G X 4 MM MISC USE ONE NEEDLE DAILY WITH LANTUS SOLOSTAR AS DIRECTED 04/01/15   Carlus Pavlovristina Gherghe, MD  furosemide (LASIX) 20 MG tablet Take 1 tablet (20 mg total) by mouth 2 (two) times daily. 05/28/15   Joycie PeekBenjamin Ronnette Rump, PA-C  glipiZIDE (GLUCOTROL) 5 MG tablet Take 1 tablet (5 mg total) by mouth daily before supper. Patient not taking: Reported on 05/28/2015 12/28/14   Carlus Pavlovristina Gherghe, MD  glucose blood (ONETOUCH VERIO)  test strip Use to test blood sugar 2 times daily as instructed. 02/11/14   Carlus Pavlov, MD  Insulin Glargine (TOUJEO SOLOSTAR) 300 UNIT/ML SOPN Inject 34 Units into the skin at bedtime. Patient not taking: Reported on 05/28/2015 03/30/15   Carlus Pavlov, MD  omeprazole (PRILOSEC) 40 MG capsule Take 1 capsule (40 mg total) by mouth daily. Patient not taking: Reported on 05/28/2015 06/25/14   Carlus Pavlov, MD  [facility] DELICA LANCETS 33G MISC Use to test blood sugar 2 times daily as instructed. 02/11/14   Carlus Pavlov, MD  sucralfate  (CARAFATE) 1 GM/10ML suspension Take 10 mLs (1 g total) by mouth 4 (four) times daily -  with meals and at bedtime. Patient not taking: Reported on 05/28/2015 01/30/15   April Palumbo, MD  sulfamethoxazole-trimethoprim (BACTRIM DS,SEPTRA DS) 800-160 MG tablet Take 2 tablets by mouth 2 (two) times daily. 7 Day Course. 05/22/15   Historical Provider, MD  sulfamethoxazole-trimethoprim (BACTRIM,SEPTRA) 200-40 MG/5ML suspension Take 80 mLs by mouth 2 (two) times daily. 7 Day Course. 05/27/15   Historical Provider, MD   BP 168/108 mmHg  Pulse 101  Temp(Src) 98.4 F (36.9 C)  Resp 18  SpO2 100% Physical Exam  Constitutional: He is oriented to person, place, and time. He appears well-developed and well-nourished.  In wheelchair. Overall well-appearing, nontoxic, no apparent distress.  HENT:  Head: Normocephalic and atraumatic.  Mouth/Throat: Oropharynx is clear and moist.  Eyes: Conjunctivae are normal. Pupils are equal, round, and reactive to light. Right eye exhibits no discharge. Left eye exhibits no discharge. No scleral icterus.  Neck: Neck supple.  Cardiovascular: Regular rhythm and normal heart sounds.   Mild tachycardia with normal heart sounds  Pulmonary/Chest: Effort normal and breath sounds normal. No respiratory distress. He has no wheezes. He has no rales.  Abdominal: Soft. There is no tenderness.  Musculoskeletal: He exhibits edema. He exhibits no tenderness.  Bilateral pedal edema-2+ anterior tibia bilaterally. No other erythema, unilateral leg swelling. No lesions. Distal pulses intact.  Neurological: He is alert and oriented to person, place, and time.  Cranial Nerves II-XII grossly intact  Skin: Skin is warm and dry. No rash noted.  Psychiatric: He has a normal mood and affect.  Nursing note and vitals reviewed.   ED Course  Procedures (including critical care time) Labs Review Labs Reviewed  I-STAT CHEM 8, ED - Abnormal; Notable for the following:    Potassium 3.3 (*)     Chloride 94 (*)    Creatinine, Ser <0.20 (*)    Glucose, Bld 315 (*)    Calcium, Ion 1.05 (*)    All other components within normal limits    Imaging Review No results found. I have personally reviewed and evaluated these images and lab results as part of my medical decision-making.   EKG Interpretation None     Meds given in ED:  Medications  furosemide (LASIX) tablet 20 mg (20 mg Oral Given 05/28/15 1550)    Discharge Medication List as of 05/28/2015  4:39 PM    START taking these medications   Details  furosemide (LASIX) 20 MG tablet Take 1 tablet (20 mg total) by mouth 2 (two) times daily., Starting 05/28/2015, Until Discontinued, Print       Filed Vitals:   05/28/15 1423 05/28/15 1539 05/28/15 1702  BP: 169/95 187/114 168/108  Pulse: 109 101 101  Temp: 98.4 F (36.9 C)    Resp: SpO2: 100% 97% 100%    MDM  [name] Fickle  A Best is a 50 y.o. male with history of muscular dystrophy, diabetes, comes in for evaluation of elevated blood pressure and pedal edema. Patient remains asymptomatic without headache, vision changes, chest pain, shortness of breath, urinary changes. On arrival, he is hemodynamically stable, mildly tachycardic, afebrile. On exam he is 2+ bilateral pedal edema, unremarkable cardiopulmonary exam. Plan to obtain Chem-8 to evaluate creatinine. Given oral Lasix 20 mg in the emergency department and plan to discharge with short course Lasix therapy to help with symptomatic edema. Creatinine is baseline for patient and is not elevated. Glucose is elevated, but patient needs to take diabetes medications when he gets home. Also discussed the importance of staying well hydrated and drink water/Gatorade. He admits he has been drinking more diet [facility] and agrees to curb soda intake. No evidence of acute heart failure or other emergent cardiopulmonary pathology. We'll need to follow-up with PCP in the next 2 days for reevaluation and medication  management. Prior to patient discharge, I discussed and reviewed this case with Dr.Kohut  Final diagnoses:  Peripheral edema  Elevated blood pressure        Joycie Peek, PA-C 05/28/15 1723  Raeford Razor, MD 06/02/15 1344

## 2015-05-28 NOTE — Discharge Instructions (Signed)
There does not appear to be an emergent cause for your symptoms at this time. Please take your medications as prescribed. Follow-up with your doctor in the next 1-2 days for reevaluation and medication reconciliation. Return to ED for any new or worsening symptoms as we discussed.  Edema Edema is an abnormal buildup of fluids in your bodytissues. Edema is somewhatdependent on gravity to pull the fluid to the lowest place in your body. That makes the condition more common in the legs and thighs (lower extremities). Painless swelling of the feet and ankles is common and becomes more likely as you get older. It is also common in looser tissues, like around your eyes.  When the affected area is squeezed, the fluid may move out of that spot and leave a dent for a few moments. This dent is called pitting.  CAUSES  There are many possible causes of edema. Eating too much salt and being on your feet or sitting for a long time can cause edema in your legs and ankles. Hot weather may make edema worse. Common medical causes of edema include:  Heart failure.  Liver disease.  Kidney disease.  Weak blood vessels in your legs.  Cancer.  An injury.  Pregnancy.  Some medications.  Obesity. SYMPTOMS  Edema is usually painless.Your skin may look swollen or shiny.  DIAGNOSIS  Your health care provider may be able to diagnose edema by asking about your medical history and doing a physical exam. You may need to have tests such as X-rays, an electrocardiogram, or blood tests to check for medical conditions that may cause edema.  TREATMENT  Edema treatment depends on the cause. If you have heart, liver, or kidney disease, you need the treatment appropriate for these conditions. General treatment may include:  Elevation of the affected body part above the level of your heart.  Compression of the affected body part. Pressure from elastic bandages or support stockings squeezes the tissues and forces fluid  back into the blood vessels. This keeps fluid from entering the tissues.  Restriction of fluid and salt intake.  Use of a water pill (diuretic). These medications are appropriate only for some types of edema. They pull fluid out of your body and make you urinate more often. This gets rid of fluid and reduces swelling, but diuretics can have side effects. Only use diuretics as directed by your health care provider. HOME CARE INSTRUCTIONS   Keep the affected body part above the level of your heart when you are lying down.   Do not sit still or stand for prolonged periods.   Do not put anything directly under your knees when lying down.  Do not wear constricting clothing or garters on your upper legs.   Exercise your legs to work the fluid back into your blood vessels. This may help the swelling go down.   Wear elastic bandages or support stockings to reduce ankle swelling as directed by your health care provider.   Eat a low-salt diet to reduce fluid if your health care provider recommends it.   Only take medicines as directed by your health care provider. SEEK MEDICAL CARE IF:   Your edema is not responding to treatment.  You have heart, liver, or kidney disease and notice symptoms of edema.  You have edema in your legs that does not improve after elevating them.   You have sudden and unexplained weight gain. SEEK IMMEDIATE MEDICAL CARE IF:   You develop shortness of breath or chest  pain.   You cannot breathe when you lie down.  You develop pain, redness, or warmth in the swollen areas.   You have heart, liver, or kidney disease and suddenly get edema.  You have a fever and your symptoms suddenly get worse. MAKE SURE YOU:   Understand these instructions.  Will watch your condition.  Will get help right away if you are not doing well or get worse.   This information is not intended to replace advice given to you by your health care provider. Make sure you  discuss any questions you have with your health care provider.   Document Released: 01/16/2005 Document Revised: 02/06/2014 Document Reviewed: 11/08/2012 Elsevier Interactive Patient Education 2016 [employer]  DASH Eating Plan DASH stands for "Dietary Approaches to Stop Hypertension." The DASH eating plan is a healthy eating plan that has been shown to reduce high blood pressure (hypertension). Additional health benefits may include reducing the risk of type 2 diabetes mellitus, heart disease, and stroke. The DASH eating plan may also help with weight loss. WHAT DO I NEED TO KNOW ABOUT THE DASH EATING PLAN? For the DASH eating plan, you will follow these general guidelines:  Choose foods with a percent daily value for sodium of less than 5% (as listed on the food label).  Use salt-free seasonings or herbs instead of table salt or sea salt.  Check with your health care provider or pharmacist before using salt substitutes.  Eat lower-sodium products, often labeled as "lower sodium" or "no salt added."  Eat fresh foods.  Eat more vegetables, fruits, and low-fat dairy products.  Choose whole grains. Look for the word "whole" as the first word in the ingredient list.  Choose fish and skinless chicken or [place] more often than red meat. Limit fish, poultry, and meat to 6 oz (170 g) each day.  Limit sweets, desserts, sugars, and sugary drinks.  Choose heart-healthy fats.  Limit cheese to 1 oz (28 g) per day.  Eat more home-cooked food and less restaurant, buffet, and fast food.  Limit fried foods.  Cook foods using methods other than frying.  Limit canned vegetables. If you do use them, rinse them well to decrease the sodium.  When eating at a restaurant, ask that your food be prepared with less salt, or no salt if possible. WHAT FOODS CAN I EAT? Seek help from a dietitian for individual calorie needs. Grains Whole grain or whole wheat bread. Brown rice. Whole grain or whole  wheat pasta. Quinoa, bulgur, and whole grain cereals. Low-sodium cereals. Corn or whole wheat flour tortillas. Whole grain cornbread. Whole grain crackers. Low-sodium crackers. Vegetables Fresh or frozen vegetables (raw, steamed, roasted, or grilled). Low-sodium or reduced-sodium tomato and vegetable juices. Low-sodium or reduced-sodium tomato sauce and paste. Low-sodium or reduced-sodium canned vegetables.  Fruits All fresh, canned (in natural juice), or frozen fruits. Meat and Other Protein Products Ground beef (85% or leaner), grass-fed beef, or beef trimmed of fat. Skinless chicken or [place]. Ground chicken or [place]. Pork trimmed of fat. All fish and seafood. Eggs. Dried beans, peas, or lentils. Unsalted nuts and seeds. Unsalted canned beans. Dairy Low-fat dairy products, such as skim or 1% milk, 2% or reduced-fat cheeses, low-fat ricotta or cottage cheese, or plain low-fat yogurt. Low-sodium or reduced-sodium cheeses. Fats and Oils Tub margarines without trans fats. Light or reduced-fat mayonnaise and salad dressings (reduced sodium). Avocado. Safflower, olive, or canola oils. Natural peanut or almond butter. Other Unsalted popcorn and pretzels. The items listed  above may not be a complete list of recommended foods or beverages. Contact your dietitian for more options. WHAT FOODS ARE NOT RECOMMENDED? Grains White bread. White pasta. White rice. Refined cornbread. Bagels and croissants. Crackers that contain trans fat. Vegetables Creamed or fried vegetables. Vegetables in a cheese sauce. Regular canned vegetables. Regular canned tomato sauce and paste. Regular tomato and vegetable juices. Fruits Dried fruits. Canned fruit in light or heavy syrup. Fruit juice. Meat and Other Protein Products Fatty cuts of meat. Ribs, chicken wings, bacon, sausage, bologna, salami, chitterlings, fatback, hot dogs, bratwurst, and packaged luncheon meats. Salted nuts and seeds. Canned beans with  salt. Dairy Whole or 2% milk, cream, half-and-half, and cream cheese. Whole-fat or sweetened yogurt. Full-fat cheeses or blue cheese. Nondairy creamers and whipped toppings. Processed cheese, cheese spreads, or cheese curds. Condiments Onion and garlic salt, seasoned salt, table salt, and sea salt. Canned and packaged gravies. Worcestershire sauce. Tartar sauce. Barbecue sauce. Teriyaki sauce. Soy sauce, including reduced sodium. Steak sauce. Fish sauce. Oyster sauce. Cocktail sauce. Horseradish. Ketchup and mustard. Meat flavorings and tenderizers. Bouillon cubes. Hot sauce. Tabasco sauce. Marinades. Taco seasonings. Relishes. Fats and Oils Butter, stick margarine, lard, shortening, ghee, and bacon fat. Coconut, palm kernel, or palm oils. Regular salad dressings. Other Pickles and olives. Salted popcorn and pretzels. The items listed above may not be a complete list of foods and beverages to avoid. Contact your dietitian for more information. WHERE CAN I FIND MORE INFORMATION? National Heart, Lung, and Blood Institute: [employer]   This information is not intended to replace advice given to you by your health care provider. Make sure you discuss any questions you have with your health care provider.   Document Released: 01/05/2011 Document Revised: 02/06/2014 Document Reviewed: 11/20/2012 Elsevier Interactive Patient Education [employer]
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
## 2015-05-31 NOTE — Progress Notes (Signed)
[name], Best ([id number]) Visit Report for 05/28/2015 Chief Complaint Document Details Patient Name: [name], [name] Date of Service: 05/28/2015 10:45 AM Medical Record Number: [id number] Patient Account Number: [id number] Date of Birth/Sex: [birth year] (50 y.o. Male) Treating RN: Phillis Haggis Primary Care Physician: Azucena Cecil, DAVID Other Clinician: Referring Physician: Azucena Cecil, DAVID Treating Physician/Extender: Rudene Re in Treatment: 4 Information Obtained from: Patient Chief Complaint Patient is at the clinic for treatment of an open pressure ulcer to the left thigh buttock and scrotal area for about 4 weeks Electronic Signature(s) Signed: 05/28/2015 11:29:19 AM By: Evlyn Kanner MD, FACS Entered By: Evlyn Kanner on 05/28/2015 11:29:19 Charlesworth, [name] -------------------------------------------------------------------------------- Debridement Details Patient Name: Knecht, Maor A. Date of Service: 05/28/2015 10:45 AM Medical Record Number: [id number] Patient Account Number: [id number] Date of Birth/Sex: 1965/06/30 (50 y.o. Male) Treating RN: Phillis Haggis Primary Care Physician: Azucena Cecil, DAVID Other Clinician: Referring Physician: Azucena Cecil, DAVID Treating Physician/Extender: Rudene Re in Treatment: 4 Debridement Performed for Wound #1 Left,Posterior Upper Leg Assessment: Performed By: Physician Evlyn Kanner, MD Debridement: Debridement Pre-procedure Yes Verification/Time Out Taken: Start Time: 10:55 Pain Control: Other : lidocaine 4% Level: Skin/Subcutaneous Tissue Total Area Debrided (L x 2 (cm) x 0.5 (cm) = 1 (cm) W): Tissue and other Viable, Non-Viable, Exudate, Fibrin/Slough, Subcutaneous material debrided: Instrument: Forceps Bleeding: Minimum End Time: 11:00 Procedural Pain: 0 Post Procedural Pain: 0 Response to Treatment: Procedure was tolerated well Post Debridement Measurements of Total Wound Length: (cm) 2 Stage:  Category/Stage II Width: (cm) 0.5 Depth: (cm) 0.1 Volume: (cm) 0.079 Post Procedure Diagnosis Same as Pre-procedure Electronic Signature(s) Signed: 05/28/2015 11:29:10 AM By: Evlyn Kanner MD, FACS Signed: 05/31/2015 4:35:40 PM By: Alejandro Mulling Previous Signature: 05/28/2015 11:23:48 AM Version By: Elliot Gurney RN, BSN, [name] RN, BSN Entered By: Evlyn Kanner on 05/28/2015 11:29:10 Morrissey, [name] ([id number]) -------------------------------------------------------------------------------- HPI Details Patient Name: [name], [name] Date of Service: 05/28/2015 10:45 AM Medical Record Number: [id number] Patient Account Number: [id number] Date of Birth/Sex: 1965-12-28 (50 y.o. Male) Treating RN: Phillis Haggis Primary Care Physician: Azucena Cecil, DAVID Other Clinician: Referring Physician: Azucena Cecil, DAVID Treating Physician/Extender: Rudene Re in Treatment: 4 History of Present Illness Location: pain and ulceration in the area of his left upper thigh and buttock and in the region of his scrotum Quality: Patient reports experiencing a dull pain to affected area(s). Severity: Patient states wound are getting worse. Duration: Patient has had the wound for < 4 weeks prior to presenting for treatment Timing: Pain in wound is Intermittent (comes and goes Context: The wound would happen gradually Modifying Factors: Other treatment(s) tried include:offloading cushion for his wheelchair and appropriate mattress Associated Signs and Symptoms: Patient reports having:in the sitting position for 10-12 hours a day in his wheelchair and the rest of the time in bed HPI Description: 50 year old male with a history of diabetes mellitus type 2 diagnosed in 2003, insulin- dependent, uncontrolled without complications. 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 hemoglobin A1c in January 2017 was 6.3% past medical  history significant for diabetes mellitus type 2, GERD, muscular dystrophy and has been a former smoker. during his last visit to his endocrinologist he was diagnosed with a possible rash a injury to his left hip and hence referred to the wound center. The patient has not been a smoker for several years and though his swallowing is difficult he still eating food to the best of  his ability. he has bladder and bowel control. Electronic Signature(s) Signed: 05/28/2015 11:29:27 AM By: Evlyn Kanner MD, FACS Entered By: Evlyn Kanner on 05/28/2015 11:29:27 Seelye, [name] ([id number]) -------------------------------------------------------------------------------- Physical Exam Details Patient Name: [name], [name] Date of Service: 05/28/2015 10:45 AM Medical Record Number: [id number] Patient Account Number: [id number] Date of Birth/Sex: 12-17-1965 (50 y.o. Male) Treating RN: Phillis Haggis Primary Care Physician: Azucena Cecil, DAVID Other Clinician: Referring Physician: Azucena Cecil, DAVID Treating Physician/Extender: Rudene Re in Treatment: 4 Constitutional . Pulse regular. Respirations normal and unlabored. Afebrile. . Eyes Nonicteric. Reactive to light. Ears, Nose, Mouth, and Throat Lips, teeth, and gums WNL.Marland Kitchen Moist mucosa without lesions. Neck supple and nontender. No palpable supraclavicular or cervical adenopathy. Normal sized without goiter. Respiratory WNL. No retractions.. Cardiovascular Pedal Pulses WNL. No clubbing, cyanosis or edema. Lymphatic No adneopathy. No adenopathy. No adenopathy. Musculoskeletal Adexa without tenderness or enlargement.. Digits and nails w/o clubbing, cyanosis, infection, petechiae, ischemia, or inflammatory conditions.. Integumentary (Hair, Skin) No suspicious lesions. No crepitus or fluctuance. No peri-wound warmth or erythema. No masses.Marland Kitchen Psychiatric Judgement and insight Intact.. No evidence of depression, anxiety, or agitation.. Notes in  the area of his left upper thigh and buttocks and also in the area medial near his scrotum he has a stage I pressure injury and does not have any cellulitis. Electronic Signature(s) Signed: 05/28/2015 11:30:38 AM By: Evlyn Kanner MD, FACS Entered By: Evlyn Kanner on 05/28/2015 11:30:37 Muckle, [name] -------------------------------------------------------------------------------- Physician Orders Details Patient Name: Devery, Barrie A. Date of Service: 05/28/2015 10:45 AM Medical Record Number: [id number] Patient Account Number: [id number] Date of Birth/Sex: [birth year] (50 y.o. Male) Treating RN: Huel Coventry Primary Care Physician: Azucena Cecil, DAVID Other Clinician: Referring Physician: Azucena Cecil, DAVID Treating Physician/Extender: Rudene Re in Treatment: 4 Verbal / Phone Orders: Yes Clinician: Huel Coventry Read Back and Verified: No Diagnosis Coding Wound Cleansing Wound #1 Left,Posterior Upper Leg o Cleanse wound with mild soap and water Anesthetic Wound #1 Left,Posterior Upper Leg o Topical Lidocaine 4% cream applied to wound bed prior to debridement Skin Barriers/Peri-Wound Care Wound #1 Left,Posterior Upper Leg o Barrier cream Primary Wound Dressing Wound #1 Left,Posterior Upper Leg o Prisma Ag Secondary Dressing Wound #1 Left,Posterior Upper Leg o Boardered Foam Dressing Dressing Change Frequency Wound #1 Left,Posterior Upper Leg o Other: - Every 3 Days per insurance authorization of BFD Follow-up Appointments Wound #1 Left,Posterior Upper Leg o Return Appointment in 2 weeks. Off-Loading Wound #1 Left,Posterior Upper Leg o Turn and reposition every 2 hours Additional Orders / Instructions Wound #1 Left,Posterior Upper Leg Tiley, POOKELA SELLIN ([id number]) o Other: - Take vitamins as prescribed Electronic Signature(s) Signed: 05/28/2015 11:23:48 AM By: Elliot Gurney RN, BSN, [name] RN, BSN Signed: 05/28/2015 12:43:21 PM By: Evlyn Kanner MD,  FACS Entered By: Elliot Gurney RN, BSN, [name] on 05/28/2015 11:05:18 Rueger, [name] -------------------------------------------------------------------------------- Problem List Details Patient Name: Bazaldua, Izaya A. Date of Service: 05/28/2015 10:45 AM Medical Record Number: [id number] Patient Account Number: [id number] Date of Birth/Sex: 10-04-1965 (50 y.o. Male) Treating RN: Phillis Haggis Primary Care Physician: Azucena Cecil, DAVID Other Clinician: Referring Physician: Azucena Cecil, DAVID Treating Physician/Extender: Rudene Re in Treatment: 4 Active Problems ICD-10 Encounter Code Description Active Date Diagnosis G12.20 Motor neuron disease, unspecified 04/30/2015 Yes E11.622 Type 2 diabetes mellitus with other skin ulcer 04/30/2015 Yes L89.321 Pressure ulcer of left buttock, stage 1 04/30/2015 Yes E66.3 Overweight 04/30/2015 Yes Inactive Problems Resolved Problems Electronic Signature(s) Signed: 05/28/2015 11:28:54 AM By: Evlyn Kanner MD, FACS Entered By: Evlyn Kanner on 05/28/2015 11:28:53  Blumenberg, JAGGER DEMONTE ([id number]) -------------------------------------------------------------------------------- Progress Note Details Patient Name: Campus, Christain A. Date of Service: 05/28/2015 10:45 AM Medical Record Number: [id number] Patient Account Number: [id number] Date of Birth/Sex: May 25, 1965 (50 y.o. Male) Treating RN: Phillis Haggis Primary Care Physician: Azucena Cecil, DAVID Other Clinician: Referring Physician: Azucena Cecil, DAVID Treating Physician/Extender: Rudene Re in Treatment: 4 Subjective Chief Complaint Information obtained from Patient Patient is at the clinic for treatment of an open pressure ulcer to the left thigh buttock and scrotal area for about 4 weeks History of Present Illness (HPI) The following HPI elements were documented for the patient's wound: Location: pain and ulceration in the area of his left upper thigh and buttock and in the region of his  scrotum Quality: Patient reports experiencing a dull pain to affected area(s). Severity: Patient states wound are getting worse. Duration: Patient has had the wound for < 4 weeks prior to presenting for treatment Timing: Pain in wound is Intermittent (comes and goes Context: The wound would happen gradually Modifying Factors: Other treatment(s) tried include:offloading cushion for his wheelchair and appropriate mattress Associated Signs and Symptoms: Patient reports having:in the sitting position for 10-12 hours a day in his wheelchair and the rest of the time in bed 50 year old male with a history of diabetes mellitus type 2 diagnosed in 2003, insulin-dependent, uncontrolled without complications. 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 hemoglobin A1c in January 2017 was 6.3% past medical history significant for diabetes mellitus type 2, GERD, muscular dystrophy and has been a former smoker. during his last visit to his endocrinologist he was diagnosed with a possible rash a injury to his left hip and hence referred to the wound center. The patient has not been a smoker for several years and though his swallowing is difficult he still eating food to the best of his ability. he has bladder and bowel control. Objective Constitutional Pulse regular. Respirations normal and unlabored. Afebrile. Cohill, POLK MINOR ([id number]) Vitals Time Taken: 10:42 AM, Height: 65 in, Source: Stated, Temperature: 98.2 F, Pulse: 99 bpm, Respiratory Rate: 16 breaths/min, Blood Pressure: 195/115 mmHg. Eyes Nonicteric. Reactive to light. Ears, Nose, Mouth, and Throat Lips, teeth, and gums WNL.Marland Kitchen Moist mucosa without lesions. Neck supple and nontender. No palpable supraclavicular or cervical adenopathy. Normal sized without goiter. Respiratory WNL. No retractions.. Cardiovascular Pedal Pulses WNL. No clubbing,  cyanosis or edema. Lymphatic No adneopathy. No adenopathy. No adenopathy. Musculoskeletal Adexa without tenderness or enlargement.. Digits and nails w/o clubbing, cyanosis, infection, petechiae, ischemia, or inflammatory conditions.Marland Kitchen Psychiatric Judgement and insight Intact.. No evidence of depression, anxiety, or agitation.. General Notes: in the area of his left upper thigh and buttocks and also in the area medial near his scrotum he has a stage I pressure injury and does not have any cellulitis. Integumentary (Hair, Skin) No suspicious lesions. No crepitus or fluctuance. No peri-wound warmth or erythema. No masses.. Wound #1 status is Open. Original cause of wound was Gradually Appeared. The wound is located on the Left,Posterior Upper Leg. The wound measures 2cm length x 0.5cm width x 0.1cm depth; 0.785cm^2 area and 0.079cm^3 volume. The wound is limited to skin breakdown. There is a large amount of serous drainage noted. The wound margin is flat and intact. There is medium (34-66%) pink granulation within the wound bed. There is a medium (34-66%) amount of necrotic tissue within the wound bed including Adherent Slough. The periwound skin appearance  exhibited: Moist. Periwound temperature was noted as No Abnormality. The periwound has tenderness on palpation. Assessment Shankle, Worthy RancherUL A. ([id number]) Active Problems ICD-10 G12.20 - Motor neuron disease, unspecified E11.622 - Type 2 diabetes mellitus with other skin ulcer L89.321 - Pressure ulcer of left buttock, stage 1 E66.3 - Overweight Procedures Wound #1 Wound #1 is a Pressure Ulcer located on the Left,Posterior Upper Leg . There was a Skin/Subcutaneous Tissue Debridement ([id number]) debridement with total area of 1 sq cm performed by Evlyn KannerBritto, Annabel Gibeau, MD. with the following instrument(s): Forceps to remove Viable and Non-Viable tissue/material including Exudate, Fibrin/Slough, and Subcutaneous after achieving pain control using  Other (lidocaine 4%). A time out was conducted prior to the start of the procedure. There was a Minimum amount of bleeding. The procedure was tolerated well with a pain level of 0 throughout and a pain level of 0 following the procedure. Post Debridement Measurements: 2cm length x 0.5cm width x 0.1cm depth; 0.079cm^3 volume. Post debridement Stage noted as Category/Stage II. Post procedure Diagnosis Wound #1: Same as Pre-Procedure Plan Wound Cleansing: Wound #1 Left,Posterior Upper Leg: Cleanse wound with mild soap and water Anesthetic: Wound #1 Left,Posterior Upper Leg: Topical Lidocaine 4% cream applied to wound bed prior to debridement Skin Barriers/Peri-Wound Care: Wound #1 Left,Posterior Upper Leg: Barrier cream Primary Wound Dressing: Wound #1 Left,Posterior Upper Leg: Prisma Ag Secondary Dressing: Wound #1 Left,Posterior Upper Leg: Boardered Foam Dressing Dressing Change Frequency: Wound #1 Left,Posterior Upper Leg: Litsey, Cameran A. ([id number]) Other: - Every 3 Days per insurance authorization of BFD Follow-up Appointments: Wound #1 Left,Posterior Upper Leg: Return Appointment in 2 weeks. Off-Loading: Wound #1 Left,Posterior Upper Leg: Turn and reposition every 2 hours Additional Orders / Instructions: Wound #1 Left,Posterior Upper Leg: Other: - Take vitamins as prescribed I have recommended: 1. An appropriate bordered foam dressing to be applied over Prisma AG to this area and change daily as required 2. Off loading in great detail 3. Appropriate questions for his wheelchair and an appropriate mattress for his bed 4. Protein supplements and vitamin A, vitamin C and zinc 5. when his swallowing gets too difficult the possibility of using a PEG tube for nutrition 6. See me back for review at regular intervals Electronic Signature(s) Signed: 05/28/2015 11:31:35 AM By: Evlyn KannerBritto, Malia Corsi MD, FACS Entered By: Evlyn KannerBritto, Carlyne Keehan on 05/28/2015 11:31:35 Bille, Worthy [name]  ([id number]) -------------------------------------------------------------------------------- SuperBill Details Patient Name: Dulay, Isauro A. Date of Service: 05/28/2015 Medical Record Number: [id number] Patient Account Number: [id number] Date of Birth/Sex: 18-Feb-1965 [age] y.o. Male) Treating RN: Phillis HaggisPinkerton, Debi Primary Care Physician: Azucena CecilSWAYNE, DAVID Other Clinician: Referring Physician: Azucena CecilSWAYNE, DAVID Treating Physician/Extender: Rudene ReBritto, Mikhala Kenan Weeks in Treatment: 4 Diagnosis Coding ICD-10 Codes Code Description G12.20 Motor neuron disease, unspecified E11.622 Type 2 diabetes mellitus with other skin ulcer L89.321 Pressure ulcer of left buttock, stage 1 E66.3 Overweight Facility Procedures CPT4 Code: [id number] Description: 11042 - DEB SUBQ TISSUE 20 SQ CM/< ICD-10 Description Diagnosis G12.20 Motor neuron disease, unspecified E11.622 Type 2 diabetes mellitus with other skin ulcer L89.321 Pressure ulcer of left buttock, stage 1 Modifier: Quantity: 1 Physician Procedures CPT4 Code: [id number] Description: 11042 - WC PHYS SUBQ TISS 20 SQ CM ICD-10 Description Diagnosis G12.20 Motor neuron disease, unspecified E11.622 Type 2 diabetes mellitus with other skin ulcer L89.321 Pressure ulcer of left buttock, stage 1 Modifier: Quantity: 1 Electronic Signature(s) Signed: 05/28/2015 11:31:55 AM By: Evlyn KannerBritto, Wateen Varon MD, FACS Entered By: Evlyn KannerBritto, Hania Cerone on 05/28/2015 11:31:54

## 2015-06-11 ENCOUNTER — Encounter: Payer: Medicare Other | Attending: Surgery | Admitting: Surgery

## 2015-06-11 DIAGNOSIS — Z87891 Personal history of nicotine dependence: Secondary | ICD-10-CM | POA: Diagnosis not present

## 2015-06-11 DIAGNOSIS — E11622 Type 2 diabetes mellitus with other skin ulcer: Secondary | ICD-10-CM | POA: Insufficient documentation

## 2015-06-11 DIAGNOSIS — E7800 Pure hypercholesterolemia, unspecified: Secondary | ICD-10-CM | POA: Diagnosis not present

## 2015-06-11 DIAGNOSIS — G710 Muscular dystrophy: Secondary | ICD-10-CM | POA: Diagnosis not present

## 2015-06-11 DIAGNOSIS — F419 Anxiety disorder, unspecified: Secondary | ICD-10-CM | POA: Diagnosis not present

## 2015-06-11 DIAGNOSIS — Z794 Long term (current) use of insulin: Secondary | ICD-10-CM | POA: Diagnosis not present

## 2015-06-11 DIAGNOSIS — E663 Overweight: Secondary | ICD-10-CM | POA: Diagnosis not present

## 2015-06-11 DIAGNOSIS — K219 Gastro-esophageal reflux disease without esophagitis: Secondary | ICD-10-CM | POA: Insufficient documentation

## 2015-06-11 DIAGNOSIS — L89312 Pressure ulcer of right buttock, stage 2: Secondary | ICD-10-CM | POA: Diagnosis not present

## 2015-06-11 DIAGNOSIS — Z880 Allergy status to penicillin: Secondary | ICD-10-CM | POA: Diagnosis not present

## 2015-06-11 DIAGNOSIS — G1220 Motor neuron disease, unspecified: Secondary | ICD-10-CM | POA: Diagnosis not present

## 2015-06-12 NOTE — Progress Notes (Signed)
[name], [name] ([id number]) Visit Report for 06/11/2015 Arrival Information Details Patient Name: [name], [name] Date of Service: 06/11/2015 1:30 PM Medical Record Number: [id number] Patient Account Number: [id number] Date of Birth/Sex: 09-11-1965 (50 y.o. Male) Treating RN: Phillis Haggis Primary Care Physician: Azucena Cecil, DAVID Other Clinician: Referring Physician: Azucena Cecil, DAVID Treating Physician/Extender: Rudene Re in Treatment: 6 Visit Information History Since Last Visit All ordered tests and consults were completed: No Patient Arrived: Wheel Chair Added or deleted any medications: No Arrival Time: 13:47 Any new allergies or adverse reactions: No Accompanied By: mother Had a fall or experienced change in No activities of daily living that may affect Transfer Assistance: Michiel Sites Lift risk of falls: Patient Identification Verified: Yes Signs or symptoms of abuse/neglect since last No Secondary Verification Process Yes visito Completed: Hospitalized since last visit: No Patient Requires Transmission-Based No Pain Present Now: No Precautions: Patient Has Alerts: Yes Patient Alerts: DM II Electronic Signature(s) Signed: 06/11/2015 5:12:54 PM By: Alejandro Mulling Entered By: Alejandro Mulling on 06/11/2015 13:47:49 [name], [name] ([id number]) -------------------------------------------------------------------------------- Clinic Level of Care Assessment Details Patient Name: Warrick, Domenique A. Date of Service: 06/11/2015 1:30 PM Medical Record Number: [id number] Patient Account Number: [id number] Date of Birth/Sex: Dec 06, 1965 (50 y.o. Male) Treating RN: Phillis Haggis Primary Care Physician: Azucena Cecil, DAVID Other Clinician: Referring Physician: Azucena Cecil, DAVID Treating Physician/Extender: Rudene Re in Treatment: 6 Clinic Level of Care Assessment Items TOOL 4 Quantity Score X - Use when only an EandM is performed on FOLLOW-UP visit 1 0 ASSESSMENTS - Nursing  Assessment / Reassessment X - Reassessment of Co-morbidities (includes updates in patient status) 1 10 X - Reassessment of Adherence to Treatment Plan 1 5 ASSESSMENTS - Wound and Skin Assessment / Reassessment X - Simple Wound Assessment / Reassessment - one wound 1 5  - Complex Wound Assessment / Reassessment - multiple wounds 0  - Dermatologic / Skin Assessment (not related to wound area) 0 ASSESSMENTS - Focused Assessment  - Circumferential Edema Measurements - multi extremities 0  - Nutritional Assessment / Counseling / Intervention 0  - Lower Extremity Assessment (monofilament, tuning fork, pulses) 0  - Peripheral Arterial Disease Assessment (using hand held doppler) 0 ASSESSMENTS - Ostomy and/or Continence Assessment and Care  - Incontinence Assessment and Management 0  - Ostomy Care Assessment and Management (repouching, etc.) 0 PROCESS - Coordination of Care X - Simple Patient / Family Education for ongoing care 1 15  - Complex (extensive) Patient / Family Education for ongoing care 0  - Staff obtains Chiropractor, Records, Test Results / Process Orders 0  - Staff telephones HHA, Nursing Homes / Clarify orders / etc 0  - Routine Transfer to another Facility (non-emergent condition) 0 [name], [name] ([id number])  - Routine Hospital Admission (non-emergent condition) 0  - New Admissions / Manufacturing engineer / Ordering NPWT, Apligraf, etc. 0  - Emergency Hospital Admission (emergent condition) 0  - Simple Discharge Coordination 0 X - Complex (extensive) Discharge Coordination 1 15 PROCESS - Special Needs  - Pediatric / Minor Patient Management 0  - Isolation Patient Management 0  - Hearing / Language / Visual special needs 0  - Assessment of Community assistance (transportation, D/C planning, etc.) 0  - Additional assistance / Altered mentation 0  - Support Surface(s) Assessment (bed, cushion, seat, etc.) 0 INTERVENTIONS - Wound  Cleansing / Measurement X - Simple Wound Cleansing - one wound 1 5  - Complex Wound Cleansing - multiple wounds 0 X - Wound Imaging (photographs - any  number of wounds) 1 5 []  - Wound Tracing (instead of photographs) 0 []  - Simple Wound Measurement - one wound 0 []  - Complex Wound Measurement - multiple wounds 0 INTERVENTIONS - Wound Dressings []  - Small Wound Dressing one or multiple wounds 0 []  - Medium Wound Dressing one or multiple wounds 0 []  - Large Wound Dressing one or multiple wounds 0 []  - Application of Medications - topical 0 []  - Application of Medications - injection 0 INTERVENTIONS - Miscellaneous []  - External ear exam 0 [name], [name] []  - Specimen Collection (cultures, biopsies, blood, body fluids, etc.) 0 []  - Specimen(s) / Culture(s) sent or taken to Lab for analysis 0 X - Patient Transfer (multiple staff / Nurse, adult / Similar devices) 1 10 []  - Simple Staple / Suture removal (25 or less) 0 []  - Complex Staple / Suture removal (26 or more) 0 []  - Hypo / Hyperglycemic Management (close monitor of Blood Glucose) 0 []  - Ankle / Brachial Index (ABI) - do not check if billed separately 0 X - Vital Signs 1 5 Has the patient been seen at the hospital within the last three years: Yes Total Score: 75 Level Of Care: New/Established - Level 2 Electronic Signature(s) Signed: 06/11/2015 5:12:54 PM By: Alejandro Mulling Entered By: Alejandro Mulling on 06/11/2015 15:54:39 [name], [name] ([id number]) -------------------------------------------------------------------------------- Encounter Discharge Information Details Patient Name: Ey, Reilly A. Date of Service: 06/11/2015 1:30 PM Medical Record Number: [id number] Patient Account Number: [id number] Date of Birth/Sex: 27-Nov-1965 (50 y.o. Male) Treating RN: Phillis Haggis Primary Care Physician: Azucena Cecil, DAVID Other Clinician: Referring Physician: Azucena Cecil, DAVID Treating Physician/Extender: Rudene Re in Treatment: 6 Encounter Discharge Information Items Discharge Pain Level: 0 Discharge Condition: Stable Ambulatory Status: Wheelchair Discharge Destination: Home Transportation: Private Auto Accompanied By: mother Schedule Follow-up Appointment: Yes Medication Reconciliation completed and provided to Patient/Care Yes Starlyn Droge: Provided on Clinical Summary of Care: 06/11/2015 Form Type Recipient Paper Patient PM Electronic Signature(s) Signed: 06/11/2015 2:33:57 PM By: Gwenlyn Perking Entered By: Gwenlyn Perking on 06/11/2015 14:33:57 [name], [name] ([id number]) -------------------------------------------------------------------------------- Lower Extremity Assessment Details Patient Name: Trageser, Dmitriy A. Date of Service: 06/11/2015 1:30 PM Medical Record Number: [id number] Patient Account Number: [id number] Date of Birth/Sex: October 28, 1965 (50 y.o. Male) Treating RN: Phillis Haggis Primary Care Physician: Azucena Cecil, DAVID Other Clinician: Referring Physician: Azucena Cecil, DAVID Treating Physician/Extender: Rudene Re in Treatment: 6 Electronic Signature(s) Signed: 06/11/2015 5:12:54 PM By: Alejandro Mulling Entered By: Alejandro Mulling on 06/11/2015 13:48:44 Goren, [name] ([id number]) -------------------------------------------------------------------------------- Multi Wound Chart Details Patient Name: Whitby, Kellin A. Date of Service: 06/11/2015 1:30 PM Medical Record Number: [id number] Patient Account Number: [id number] Date of Birth/Sex: [birth year] (50 y.o. Male) Treating RN: Phillis Haggis Primary Care Physician: Azucena Cecil, DAVID Other Clinician: Referring Physician: Azucena Cecil, DAVID Treating Physician/Extender: Rudene Re in Treatment: 6 Vital Signs Height(in): 65 Pulse(bpm): 101 Weight(lbs): Blood Pressure 157/84 (mmHg): Body Mass Index(BMI): Temperature(F): 98.0 Respiratory Rate 18 (breaths/min): Photos: [1:No Photos] [N/A:N/A] Wound  Location: [1:Left Upper Leg - Posterior] [N/A:N/A] Wounding Event: [1:Gradually Appeared] [N/A:N/A] Primary Etiology: [1:Pressure Ulcer] [N/A:N/A] Comorbid History: [1:Type II Diabetes] [N/A:N/A] Date Acquired: [1:03/30/2015] [N/A:N/A] Weeks of Treatment: [1:6] [N/A:N/A] Wound Status: [1:Open] [N/A:N/A] Measurements L x W x D 2x0.5x0.1 [N/A:N/A] (cm) Area (cm) : [1:0.785] [N/A:N/A] Volume (cm) : [1:0.079] [N/A:N/A] % Reduction in Area: [1:97.20%] [N/A:N/A] % Reduction in Volume: 97.20% [N/A:N/A] Classification: [1:Category/Stage II] [N/A:N/A] HBO Classification: [1:Grade 1] [N/A:N/A] Exudate Amount: [1:Large] [N/A:N/A] Exudate Type: [1:Serous] [N/A:N/A] Exudate Color: [1:amber] [N/A:N/A] Wound Margin: [1:Flat  and Intact] [N/A:N/A] Granulation Amount: [1:Medium (34-66%)] [N/A:N/A] Granulation Quality: [1:Pink] [N/A:N/A] Necrotic Amount: [1:Medium (34-66%)] [N/A:N/A] Exposed Structures: [1:Fascia: No Fat: No Tendon: No Muscle: No Joint: No Bone: No] [N/A:N/A] Limited to Skin Breakdown Epithelialization: Small (1-33%) N/A N/A Periwound Skin Texture: No Abnormalities Noted N/A N/A Periwound Skin Moist: Yes N/A N/A Moisture: Periwound Skin Color: No Abnormalities Noted N/A N/A Temperature: No Abnormality N/A N/A Tenderness on Yes N/A N/A Palpation: Wound Preparation: Ulcer Cleansing: N/A N/A Rinsed/Irrigated with Saline Topical Anesthetic Applied: Other: lidocaine 4% Treatment Notes Electronic Signature(s) Signed: 06/11/2015 5:12:54 PM By: Alejandro MullingPinkerton, [name] Entered By: Alejandro MullingPinkerton, [name] on 06/11/2015 14:01:46 [name], [name] ([id number]) -------------------------------------------------------------------------------- Multi-Disciplinary Care Plan Details Patient Name: Aurich, Ajax A. Date of Service: 06/11/2015 1:30 PM Medical Record Number: [id number] Patient Account Number: [id number] Date of Birth/Sex: 1965/04/21 43(49 y.o. Male) Treating RN: Phillis HaggisPinkerton, Debi Primary Care  Physician: Azucena CecilSWAYNE, DAVID Other Clinician: Referring Physician: Azucena CecilSWAYNE, DAVID Treating Physician/Extender: Rudene ReBritto, Errol Weeks in Treatment: 6 Active Inactive Electronic Signature(s) Signed: 06/11/2015 5:12:54 PM By: Alejandro MullingPinkerton, [name] Entered By: Alejandro MullingPinkerton, [name] on 06/11/2015 15:53:21 [name], [name] ([id number]) -------------------------------------------------------------------------------- Pain Assessment Details Patient Name: Seifert, Antwuan A. Date of Service: 06/11/2015 1:30 PM Medical Record Number: [id number] Patient Account Number: [id number] Date of Birth/Sex: 1965/04/21 97(49 y.o. Male) Treating RN: Phillis HaggisPinkerton, Debi Primary Care Physician: Azucena CecilSWAYNE, DAVID Other Clinician: Referring Physician: Azucena CecilSWAYNE, DAVID Treating Physician/Extender: Rudene ReBritto, Errol Weeks in Treatment: 6 Active Problems Location of Pain Severity and Description of Pain Patient Has Paino No Site Locations Pain Management and Medication Current Pain Management: Electronic Signature(s) Signed: 06/11/2015 5:12:54 PM By: Alejandro MullingPinkerton, [name] Entered By: Alejandro MullingPinkerton, [name] on 06/11/2015 13:48:01 [name], [name] ([id number]) -------------------------------------------------------------------------------- Patient/Caregiver Education Details Patient Name: Ecklund, Damarri A. Date of Service: 06/11/2015 1:30 PM Medical Record Number: [id number] Patient Account Number: [id number] Date of Birth/Gender: 1965/04/21 [age] y.o. Male) Treating RN: Phillis HaggisPinkerton, Debi Primary Care Physician: Azucena CecilSWAYNE, DAVID Other Clinician: Referring Physician: Azucena CecilSWAYNE, DAVID Treating Physician/Extender: Rudene ReBritto, Errol Weeks in Treatment: 6 Education Assessment Education Provided To: Patient and Caregiver Education Topics Provided Wound/Skin Impairment: Handouts: Other: change dressing as ordered Electronic Signature(s) Signed: 06/11/2015 5:12:54 PM By: Alejandro MullingPinkerton, [name] Entered By: Alejandro MullingPinkerton, [name] on 06/11/2015 14:11:43 Chatterjee, [name]  ([id number]) -------------------------------------------------------------------------------- Wound Assessment Details Patient Name: Carlo, Larsen A. Date of Service: 06/11/2015 1:30 PM Medical Record Number: [id number] Patient Account Number: [id number] Date of Birth/Sex: 1965/04/21 [age] y.o. Male) Treating RN: Phillis HaggisPinkerton, Debi Primary Care Physician: Azucena CecilSWAYNE, DAVID Other Clinician: Referring Physician: Azucena CecilSWAYNE, DAVID Treating Physician/Extender: Rudene ReBritto, Errol Weeks in Treatment: 6 Wound Status Wound Number: 1 Primary Etiology: Pressure Ulcer Wound Location: Left Upper Leg - Posterior Wound Status: Open Wounding Event: Gradually Appeared Comorbid History: Type II Diabetes Date Acquired: 03/30/2015 Weeks Of Treatment: 6 Clustered Wound: No Photos Photo Uploaded By: Alejandro MullingPinkerton, [name] on 06/11/2015 16:58:28 Wound Measurements Length: (cm) 0 % Reduction Width: (cm) 0 % Reduction Depth: (cm) 0 Epitheliali Area: (cm) 0 Tunneling: Volume: (cm) 0 Underminin in Area: 100% in Volume: 100% zation: Large (67-100%) No g: No Wound Description Classification: Category/Stage II Foul Odor Diabetic Severity (Wagner): Grade 1 Wound Margin: Flat and Intact Exudate Amount: None Present After Cleansing: No Wound Bed Granulation Amount: None Present (0%) Exposed Structure Necrotic Amount: None Present (0%) Fascia Exposed: No Fat Layer Exposed: No Tendon Exposed: No Muscle Exposed: No Hamon, Bradden A. ([id number]) Joint Exposed: No Bone Exposed: No Limited to Skin Breakdown Periwound Skin Texture Texture Color No Abnormalities Noted: No No Abnormalities Noted: No Moisture Temperature / Pain No Abnormalities Noted: No  Temperature: No Abnormality Moist: No Tenderness on Palpation: Yes Wound Preparation Ulcer Cleansing: Rinsed/Irrigated with Saline Topical Anesthetic Applied: None Electronic Signature(s) Signed: 06/11/2015 5:12:54 PM By: Alejandro Mulling Entered By: Alejandro Mulling  on 06/11/2015 14:20:49 Lerette, [name] ([id number]) -------------------------------------------------------------------------------- Vitals Details Patient Name: Vachon, Raffi A. Date of Service: 06/11/2015 1:30 PM Medical Record Number: [id number] Patient Account Number: [id number] Date of Birth/Sex: [birth year] (50 y.o. Male) Treating RN: Phillis Haggis Primary Care Physician: Azucena Cecil, DAVID Other Clinician: Referring Physician: Azucena Cecil, DAVID Treating Physician/Extender: Rudene Re in Treatment: 6 Vital Signs Time Taken: 13:48 Temperature (F): 98.0 Height (in): 65 Pulse (bpm): 101 Respiratory Rate (breaths/min): 18 Blood Pressure (mmHg): 157/84 Reference Range: 80 - 120 mg / dl Electronic Signature(s) Signed: 06/11/2015 5:12:54 PM By: Alejandro Mulling Entered By: Alejandro Mulling on 06/11/2015 13:48:36

## 2015-06-12 NOTE — Progress Notes (Signed)
[name], [name] ([id number]) Visit Report for 06/11/2015 Chief Complaint Document Details Patient Name: [name], [name] [name] Date of Service: 06/11/2015 1:30 PM Medical Record Number: [id number] Patient Account Number: [id number] Date of Birth/Sex: 12/14/1965 [age] y.o. Male) Treating RN: Phillis HaggisPinkerton, Debi Primary Care Physician: Azucena CecilSWAYNE, DAVID Other Clinician: Referring Physician: Azucena CecilSWAYNE, DAVID Treating Physician/Extender: Rudene ReBritto, De [place] Weeks in Treatment: 6 Information Obtained from: Patient Chief Complaint Patient is at the clinic for treatment of an open pressure ulcer to the left thigh buttock and scrotal area for about 4 weeks Electronic Signature(s) Signed: 06/11/2015 2:23:07 PM By: Evlyn KannerBritto, Mckynlee Luse MD, FACS Entered By: Evlyn KannerBritto, Chayse Zatarain on 06/11/2015 14:23:07 Nicoletti, Worthy [name] ([id number]) -------------------------------------------------------------------------------- HPI Details Patient Name: Best, [name] Date of Service: 06/11/2015 1:30 PM Medical Record Number: [id number] Patient Account Number: [id number] Date of Birth/Sex: 12/14/1965 [age] y.o. Male) Treating RN: Phillis HaggisPinkerton, Debi Primary Care Physician: Azucena CecilSWAYNE, DAVID Other Clinician: Referring Physician: Azucena CecilSWAYNE, DAVID Treating Physician/Extender: Rudene ReBritto, Castin Donaghue Weeks in Treatment: 6 History of Present Illness Location: pain and ulceration in the area of his left upper thigh and buttock and in the region of his scrotum Quality: Patient reports experiencing a dull pain to affected area(s). Severity: Patient states wound are getting worse. Duration: Patient has had the wound for < 4 weeks prior to presenting for treatment Timing: Pain in wound is Intermittent (comes and goes Context: The wound would happen gradually Modifying Factors: Other treatment(s) tried include:offloading cushion for his wheelchair and appropriate mattress Associated Signs and Symptoms: Patient reports having:in the sitting position for 10-12 hours a day in his  wheelchair and the rest of the time in bed HPI Description: 50 year old male with a history of diabetes mellitus type 2 diagnosed in 2003, insulin- dependent, uncontrolled without complications. 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 hemoglobin A1c in January 2017 was 6.3% past medical history significant for diabetes mellitus type 2, GERD, muscular dystrophy and has been a former smoker. during his last visit to his endocrinologist he was diagnosed with a possible rash a injury to his left hip and hence referred to the wound center. The patient has not been a smoker for several years and though his swallowing is difficult he still eating food to the best of his ability. he has bladder and bowel control. Electronic Signature(s) Signed: 06/11/2015 2:23:11 PM By: Evlyn KannerBritto, Aarti Mankowski MD, FACS Entered By: Evlyn KannerBritto, Delaney Schnick on 06/11/2015 14:23:11 Hyland, Worthy [name] ([id number]) -------------------------------------------------------------------------------- Physical Exam Details Patient Name: Best, [name] Date of Service: 06/11/2015 1:30 PM Medical Record Number: [id number] Patient Account Number: [id number] Date of Birth/Sex: 12/14/1965 [age] y.o. Male) Treating RN: Phillis HaggisPinkerton, Debi Primary Care Physician: Azucena CecilSWAYNE, DAVID Other Clinician: Referring Physician: Azucena CecilSWAYNE, DAVID Treating Physician/Extender: Rudene ReBritto, Stefanie Hodgens Weeks in Treatment: 6 Constitutional . Pulse regular. Respirations normal and unlabored. Afebrile. . Eyes Nonicteric. Reactive to light. Ears, Nose, Mouth, and Throat Lips, teeth, and gums WNL.Marland Kitchen. Moist mucosa without lesions. Neck supple and nontender. No palpable supraclavicular or cervical adenopathy. Normal sized without goiter. Respiratory WNL. No retractions.. Cardiovascular Pedal Pulses WNL. No clubbing, cyanosis or edema. Lymphatic No adneopathy. No adenopathy. No  adenopathy. Musculoskeletal Adexa without tenderness or enlargement.. Digits and nails w/o clubbing, cyanosis, infection, petechiae, ischemia, or inflammatory conditions.. Integumentary (Hair, Skin) No suspicious lesions. No crepitus or fluctuance. No peri-wound warmth or erythema. No masses.Marland Kitchen. Psychiatric Judgement and insight Intact.. No evidence of depression, anxiety, or agitation.. Notes the area of concern on the  left upper thigh and buttock region is completely healed and there is no open ulcer. Electronic Signature(s) Signed: 06/11/2015 2:23:44 PM By: Evlyn Kanner MD, FACS Entered By: Evlyn Kanner on 06/11/2015 14:23:44 Holbein, [name] ([id number]) -------------------------------------------------------------------------------- Physician Orders Details Patient Name: Best, [name] Date of Service: 06/11/2015 1:30 PM Medical Record Number: [id number] Patient Account Number: [id number] Date of Birth/Sex: 09-02-1965 (50 y.o. Male) Treating RN: Phillis Haggis Primary Care Physician: Azucena Cecil, DAVID Other Clinician: Referring Physician: Azucena Cecil, DAVID Treating Physician/Extender: Rudene Re in Treatment: 6 Verbal / Phone Orders: Yes Clinician: Ashok Cordia, Debi Read Back and Verified: Yes Diagnosis Coding Discharge From [facility] o Discharge from Wound Care Center - Change position often. Call us if you need anything or have any questions. Electronic Signature(s) Signed: 06/11/2015 3:59:17 PM By: Evlyn Kanner MD, FACS Signed: 06/11/2015 5:12:54 PM By: Alejandro Mulling Entered By: Alejandro Mulling on 06/11/2015 14:22:12 Kiernan, [name] -------------------------------------------------------------------------------- Problem List Details Patient Name: Sparano, Dianna A. Date of Service: 06/11/2015 1:30 PM Medical Record Number: [id number] Patient Account Number: [id number] Date of Birth/Sex: December 20, 1965 (50 y.o. Male) Treating RN: Phillis Haggis Primary  Care Physician: Azucena Cecil, DAVID Other Clinician: Referring Physician: Azucena Cecil, DAVID Treating Physician/Extender: Rudene Re in Treatment: 6 Active Problems ICD-10 Encounter Code Description Active Date Diagnosis G12.20 Motor neuron disease, unspecified 04/30/2015 Yes E11.622 Type 2 diabetes mellitus with other skin ulcer 04/30/2015 Yes L89.321 Pressure ulcer of left buttock, stage 1 04/30/2015 Yes E66.3 Overweight 04/30/2015 Yes Inactive Problems Resolved Problems Electronic Signature(s) Signed: 06/11/2015 2:22:48 PM By: Evlyn Kanner MD, FACS Entered By: Evlyn Kanner on 06/11/2015 14:22:48 Mccomb, [name] ([id number]) -------------------------------------------------------------------------------- Progress Note Details Patient Name: Duhon, Haven A. Date of Service: 06/11/2015 1:30 PM Medical Record Number: [id number] Patient Account Number: [id number] Date of Birth/Sex: 05-29-1965 (50 y.o. Male) Treating RN: Phillis Haggis Primary Care Physician: Azucena Cecil, DAVID Other Clinician: Referring Physician: Azucena Cecil, DAVID Treating Physician/Extender: Rudene Re in Treatment: 6 Subjective Chief Complaint Information obtained from Patient Patient is at the clinic for treatment of an open pressure ulcer to the left thigh buttock and scrotal area for about 4 weeks History of Present Illness (HPI) The following HPI elements were documented for the patient's wound: Location: pain and ulceration in the area of his left upper thigh and buttock and in the region of his scrotum Quality: Patient reports experiencing a dull pain to affected area(s). Severity: Patient states wound are getting worse. Duration: Patient has had the wound for < 4 weeks prior to presenting for treatment Timing: Pain in wound is Intermittent (comes and goes Context: The wound would happen gradually Modifying Factors: Other treatment(s) tried include:offloading cushion for his wheelchair and  appropriate mattress Associated Signs and Symptoms: Patient reports having:in the sitting position for 10-12 hours a day in his wheelchair and the rest of the time in bed 50 year old male with a history of diabetes mellitus type 2 diagnosed in 2003, insulin-dependent, uncontrolled without complications. 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 hemoglobin A1c in January 2017 was 6.3% past medical history significant for diabetes mellitus type 2, GERD, muscular dystrophy and has been a former smoker. during his last visit to his endocrinologist he was diagnosed with a possible rash a injury to his left hip and hence referred to the wound center. The patient has not been a smoker for several years and though his swallowing is difficult he still eating  food to the best of his ability. he has bladder and bowel control. Objective Constitutional Pulse regular. Respirations normal and unlabored. Afebrile. Vanness, KAMEN HANKEN ([id number]) Vitals Time Taken: 1:48 PM, Height: 65 in, Temperature: 98.0 F, Pulse: 101 bpm, Respiratory Rate: 18 breaths/min, Blood Pressure: 157/84 mmHg. Eyes Nonicteric. Reactive to light. Ears, Nose, Mouth, and Throat Lips, teeth, and gums WNL.Marland Kitchen Moist mucosa without lesions. Neck supple and nontender. No palpable supraclavicular or cervical adenopathy. Normal sized without goiter. Respiratory WNL. No retractions.. Cardiovascular Pedal Pulses WNL. No clubbing, cyanosis or edema. Lymphatic No adneopathy. No adenopathy. No adenopathy. Musculoskeletal Adexa without tenderness or enlargement.. Digits and nails w/o clubbing, cyanosis, infection, petechiae, ischemia, or inflammatory conditions.Marland Kitchen Psychiatric Judgement and insight Intact.. No evidence of depression, anxiety, or agitation.. General Notes: the area of concern on the left upper thigh and buttock region is completely  healed and there is no open ulcer. Integumentary (Hair, Skin) No suspicious lesions. No crepitus or fluctuance. No peri-wound warmth or erythema. No masses.. Wound #1 status is Open. Original cause of wound was Gradually Appeared. The wound is located on the Left,Posterior Upper Leg. The wound measures 0cm length x 0cm width x 0cm depth; 0cm^2 area and 0cm^3 volume. The wound is limited to skin breakdown. There is no tunneling or undermining noted. There is a none present amount of drainage noted. The wound margin is flat and intact. There is no granulation within the wound bed. There is no necrotic tissue within the wound bed. The periwound skin appearance did not exhibit: Moist. Periwound temperature was noted as No Abnormality. The periwound has tenderness on palpation. Assessment Abe, MAXEN ROWLAND ([id number]) Active Problems ICD-10 G12.20 - Motor neuron disease, unspecified E11.622 - Type 2 diabetes mellitus with other skin ulcer L89.321 - Pressure ulcer of left buttock, stage 1 E66.3 - Overweight Plan Discharge From [facility] Services: Discharge from Wound Care Center - Change position often. Call us if you need anything or have any questions. Having healed his wound I have discharged him from the wound care center with reminders about his nutrition, vitamin supplements and most importantly his offloading and he and his mother say they will be compliant. He will be seen back on a when necessary basis. Electronic Signature(s) Signed: 06/11/2015 2:24:37 PM By: Evlyn Kanner MD, FACS Entered By: Evlyn Kanner on 06/11/2015 14:24:37 Burkholder, [name] -------------------------------------------------------------------------------- SuperBill Details Patient Name: Brasil, Carlo A. Date of Service: 06/11/2015 Medical Record Number: [id number] Patient Account Number: [id number] Date of Birth/Sex: 1965/03/08 (50 y.o. Male) Treating RN: Phillis Haggis Primary Care Physician: Azucena Cecil,  DAVID Other Clinician: Referring Physician: Azucena Cecil, DAVID Treating Physician/Extender: Rudene Re in Treatment: 6 Diagnosis Coding ICD-10 Codes Code Description G12.20 Motor neuron disease, unspecified E11.622 Type 2 diabetes mellitus with other skin ulcer L89.321 Pressure ulcer of left buttock, stage 1 E66.3 Overweight Facility Procedures CPT4 Code: [id number] Description: 32440 - WOUND CARE VISIT-LEV 2 EST PT Modifier: Quantity: 1 Physician Procedures CPT4 Code: [id number] Description: 99213 - WC PHYS LEVEL 3 - EST PT ICD-10 Description Diagnosis G12.20 Motor neuron disease, unspecified E11.622 Type 2 diabetes mellitus with other skin ulcer L89.321 Pressure ulcer of left buttock, stage 1 Modifier: Quantity: 1 Electronic Signature(s) Signed: 06/11/2015 3:59:17 PM By: Evlyn Kanner MD, FACS Signed: 06/11/2015 5:12:54 PM By: Alejandro Mulling Previous Signature: 06/11/2015 2:24:51 PM Version By: Evlyn Kanner MD, FACS Entered By: Alejandro Mulling on 06/11/2015 15:54:53

## 2015-06-23 ENCOUNTER — Encounter: Payer: Self-pay | Admitting: Internal Medicine

## 2015-06-23 NOTE — Progress Notes (Signed)
Received labs from Dr. Susa Raringavid Swain, drawn on 06/21/2015: - HbA1c 8.2% - CMP normal, except high glucose, 325 - Lipids: 130/379/33/21 Will discuss at next visit.

## 2015-07-02 ENCOUNTER — Encounter: Payer: Medicare Other | Attending: Surgery | Admitting: Surgery

## 2015-07-02 DIAGNOSIS — G1220 Motor neuron disease, unspecified: Secondary | ICD-10-CM | POA: Diagnosis not present

## 2015-07-02 DIAGNOSIS — L89321 Pressure ulcer of left buttock, stage 1: Secondary | ICD-10-CM | POA: Insufficient documentation

## 2015-07-02 DIAGNOSIS — Z794 Long term (current) use of insulin: Secondary | ICD-10-CM | POA: Insufficient documentation

## 2015-07-02 DIAGNOSIS — F419 Anxiety disorder, unspecified: Secondary | ICD-10-CM | POA: Insufficient documentation

## 2015-07-02 DIAGNOSIS — K219 Gastro-esophageal reflux disease without esophagitis: Secondary | ICD-10-CM | POA: Insufficient documentation

## 2015-07-02 DIAGNOSIS — E11622 Type 2 diabetes mellitus with other skin ulcer: Secondary | ICD-10-CM | POA: Diagnosis not present

## 2015-07-02 DIAGNOSIS — E7800 Pure hypercholesterolemia, unspecified: Secondary | ICD-10-CM | POA: Diagnosis not present

## 2015-07-02 DIAGNOSIS — G710 Muscular dystrophy: Secondary | ICD-10-CM | POA: Insufficient documentation

## 2015-07-02 DIAGNOSIS — Z87891 Personal history of nicotine dependence: Secondary | ICD-10-CM | POA: Diagnosis not present

## 2015-07-02 DIAGNOSIS — E663 Overweight: Secondary | ICD-10-CM | POA: Insufficient documentation

## 2015-07-02 DIAGNOSIS — Z880 Allergy status to penicillin: Secondary | ICD-10-CM | POA: Diagnosis not present

## 2015-07-16 ENCOUNTER — Encounter: Payer: Medicare Other | Admitting: Surgery

## 2015-07-16 DIAGNOSIS — E11622 Type 2 diabetes mellitus with other skin ulcer: Secondary | ICD-10-CM | POA: Diagnosis not present

## 2015-07-17 NOTE — Progress Notes (Signed)
KRISHAV, MAMONE ([id number]) Visit Report for 07/16/2015 Arrival Information Details Patient Name: Longley, ZAEDYN COVIN. Date of Service: 07/16/2015 1:30 PM Medical Record Number: [id number] Patient Account Number: [id number] Date of Birth/Sex: Sep 10, 1965 (50 y.o. Male) Treating RN: Phillis Haggis Primary Care Physician: Azucena Cecil, DAVID Other Clinician: Referring Physician: Azucena Cecil, DAVID Treating Physician/Extender: Rudene Re in Treatment: 11 Visit Information History Since Last Visit All ordered tests and consults were completed: No Patient Arrived: Wheel Chair Added or deleted any medications: No Arrival Time: 13:45 Any new allergies or adverse reactions: No Accompanied By: mother Had a fall or experienced change in No activities of daily living that may affect Transfer Assistance: Michiel Sites Lift risk of falls: Patient Identification Verified: Yes Signs or symptoms of abuse/neglect since last No Secondary Verification Process Yes visito Completed: Hospitalized since last visit: No Patient Requires Transmission-Based No Pain Present Now: Yes Precautions: Patient Has Alerts: Yes Patient Alerts: DM II Electronic Signature(s) Signed: 07/16/2015 5:42:36 PM By: Alejandro Mulling Entered By: Alejandro Mulling on 07/16/2015 13:46:22 Ehly, [name] -------------------------------------------------------------------------------- Clinic Level of Care Assessment Details Patient Name: Newnam, Chozen A. Date of Service: 07/16/2015 1:30 PM Medical Record Number: [id number] Patient Account Number: [id number] Date of Birth/Sex: 02/11/1965 (50 y.o. Male) Treating RN: Phillis Haggis Primary Care Physician: Azucena Cecil, DAVID Other Clinician: Referring Physician: Azucena Cecil, DAVID Treating Physician/Extender: Rudene Re in Treatment: 11 Clinic Level of Care Assessment Items TOOL 4 Quantity Score X - Use when only an EandM is performed on FOLLOW-UP visit 1 0 ASSESSMENTS -  Nursing Assessment / Reassessment X - Reassessment of Co-morbidities (includes updates in patient status) 1 10 X - Reassessment of Adherence to Treatment Plan 1 5 ASSESSMENTS - Wound and Skin Assessment / Reassessment X - Simple Wound Assessment / Reassessment - one wound 1 5  - Complex Wound Assessment / Reassessment - multiple wounds 0  - Dermatologic / Skin Assessment (not related to wound area) 0 ASSESSMENTS - Focused Assessment  - Circumferential Edema Measurements - multi extremities 0  - Nutritional Assessment / Counseling / Intervention 0  - Lower Extremity Assessment (monofilament, tuning fork, pulses) 0  - Peripheral Arterial Disease Assessment (using hand held doppler) 0 ASSESSMENTS - Ostomy and/or Continence Assessment and Care  - Incontinence Assessment and Management 0  - Ostomy Care Assessment and Management (repouching, etc.) 0 PROCESS - Coordination of Care X - Simple Patient / Family Education for ongoing care 1 15  - Complex (extensive) Patient / Family Education for ongoing care 0  - Staff obtains Chiropractor, Records, Test Results / Process Orders 0  - Staff telephones HHA, Nursing Homes / Clarify orders / etc 0  - Routine Transfer to another Facility (non-emergent condition) 0 [place], JUMAR [address] ([id number])  - Routine Hospital Admission (non-emergent condition) 0  - New Admissions / Manufacturing engineer / Ordering NPWT, Apligraf, etc. 0  - Emergency Hospital Admission (emergent condition) 0 X - Simple Discharge Coordination 1 10  - Complex (extensive) Discharge Coordination 0 PROCESS - Special Needs  - Pediatric / Minor Patient Management 0  - Isolation Patient Management 0  - Hearing / Language / Visual special needs 0  - Assessment of Community assistance (transportation, D/C planning, etc.) 0  - Additional assistance / Altered mentation 0  - Support Surface(s) Assessment (bed, cushion, seat, etc.) 0 INTERVENTIONS -  Wound Cleansing / Measurement X - Simple Wound Cleansing - one wound 1 5  - Complex Wound Cleansing - multiple wounds 0 X - Wound Imaging (photographs - any  number of wounds) 1 5 []  - Wound Tracing (instead of photographs) 0 X - Simple Wound Measurement - one wound 1 5 []  - Complex Wound Measurement - multiple wounds 0 INTERVENTIONS - Wound Dressings X - Small Wound Dressing one or multiple wounds 1 10 []  - Medium Wound Dressing one or multiple wounds 0 []  - Large Wound Dressing one or multiple wounds 0 X - Application of Medications - topical 1 5 []  - Application of Medications - injection 0 INTERVENTIONS - Miscellaneous []  - External ear exam 0 Greff, Kawhi A. ([id number]) []  - Specimen Collection (cultures, biopsies, blood, body fluids, etc.) 0 []  - Specimen(s) / Culture(s) sent or taken to Lab for analysis 0 []  - Patient Transfer (multiple staff / Michiel Sites Lift / Similar devices) 0 []  - Simple Staple / Suture removal (25 or less) 0 []  - Complex Staple / Suture removal (26 or more) 0 []  - Hypo / Hyperglycemic Management (close monitor of Blood Glucose) 0 []  - Ankle / Brachial Index (ABI) - do not check if billed separately 0 X - Vital Signs 1 5 Has the patient been seen at the hospital within the last three years: Yes Total Score: 80 Level Of Care: New/Established - Level 3 Electronic Signature(s) Signed: 07/16/2015 5:42:36 PM By: Alejandro Mulling Entered By: Alejandro Mulling on 07/16/2015 17:20:07 Sexson, [name] ([id number]) -------------------------------------------------------------------------------- Encounter Discharge Information Details Patient Name: Holtzclaw, Lavontay A. Date of Service: 07/16/2015 1:30 PM Medical Record Number: [id number] Patient Account Number: [id number] Date of Birth/Sex: [birth year] (50 y.o. Male) Treating RN: Phillis Haggis Primary Care Physician: Azucena Cecil, DAVID Other Clinician: Referring Physician: Azucena Cecil, DAVID Treating Physician/Extender: Rudene Re in Treatment: 11 Encounter Discharge Information Items Discharge Pain Level: 0 Discharge Condition: Stable Ambulatory Status: Wheelchair Discharge Destination: Home Transportation: Private Auto Accompanied By: mother Schedule Follow-up Appointment: Yes Medication Reconciliation completed and provided to Patient/Care Yes Berkleigh Beckles: Provided on Clinical Summary of Care: 07/16/2015 Form Type Recipient Paper Patient PM Electronic Signature(s) Signed: 07/16/2015 2:25:18 PM By: Gwenlyn Perking Entered By: Gwenlyn Perking on 07/16/2015 14:25:17 Windhorst, [name] ([id number]) -------------------------------------------------------------------------------- Lower Extremity Assessment Details Patient Name: Molinari, Ladarius A. Date of Service: 07/16/2015 1:30 PM Medical Record Number: [id number] Patient Account Number: [id number] Date of Birth/Sex: Aug 10, 1965 (50 y.o. Male) Treating RN: Phillis Haggis Primary Care Physician: Azucena Cecil, DAVID Other Clinician: Referring Physician: Azucena Cecil, DAVID Treating Physician/Extender: Rudene Re in Treatment: 11 Electronic Signature(s) Signed: 07/16/2015 5:42:36 PM By: Alejandro Mulling Entered By: Alejandro Mulling on 07/16/2015 13:47:59 Gintz, [name] -------------------------------------------------------------------------------- Multi Wound Chart Details Patient Name: Sugrue, Chisum A. Date of Service: 07/16/2015 1:30 PM Medical Record Number: [id number] Patient Account Number: [id number] Date of Birth/Sex: July 31, 1965 (50 y.o. Male) Treating RN: Phillis Haggis Primary Care Physician: Azucena Cecil, DAVID Other Clinician: Referring Physician: Azucena Cecil, DAVID Treating Physician/Extender: Rudene Re in Treatment: 11 Vital Signs Height(in): 65 Pulse(bpm): 102 Weight(lbs): Blood Pressure 164/99 (mmHg): Body Mass Index(BMI): Temperature(F): 97.8 Respiratory Rate 18 (breaths/min): Photos: [2:No Photos] [3:No Photos]  [N/A:N/A] Wound Location: [2:Left Gluteal fold] [3:Left Scrotum] [N/A:N/A] Wounding Event: [2:Pressure Injury] [3:Pressure Injury] [N/A:N/A] Primary Etiology: [2:Pressure Ulcer] [3:Pressure Ulcer] [N/A:N/A] 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] [3:N/A] [N/A:N/A] Comorbid History: [2:Type II Diabetes] [3:Type II Diabetes] [N/A:N/A] Date Acquired: [2:07/01/2015] [3:07/09/2015] [N/A:N/A] Weeks of Treatment: [2:2] [3:0] [N/A:N/A] Wound Status: [2:Open] [3:Open] [N/A:N/A] Measurements L x W x D 6x4x0.1 [3:3x0.5x0.1] [N/A:N/A] (cm) Area (cm) : [2:18.85] [3:1.178] [N/A:N/A] Volume (cm) : [2:1.885] [3:0.118] [N/A:N/A] % Reduction in Area: [2:-20.00%] [3:N/A] [N/A:N/A] % Reduction in Volume: 40.00% [  3:N/A] [N/A:N/A] Classification: [2:Category/Stage II] [3:Category/Stage II] [N/A:N/A] Exudate Amount: [2:Large] [3:Large] [N/A:N/A] Exudate Type: [2:Serosanguineous] [3:Serous] [N/A:N/A] Exudate Color: [2:red, brown] [3:amber] [N/A:N/A] Wound Margin: [2:Thickened] [3:Flat and Intact] [N/A:N/A] Granulation Amount: [2:Medium (34-66%)] [3:Large (67-100%)] [N/A:N/A] Granulation Quality: [2:Red] [3:Pink] [N/A:N/A] Necrotic Amount: [2:Medium (34-66%)] [3:Small (1-33%)] [N/A:N/A] Necrotic Tissue: [2:Adherent Slough] [3:Eschar] [N/A:N/A] Exposed Structures: [2:Fascia: No Fat: No Tendon: No Muscle: No Joint: No] [3:Fascia: No Fat: No Tendon: No Muscle: No Joint: No] [N/A:N/A] Bone: No Bone: No Limited to Skin Limited to Skin Breakdown Breakdown Epithelialization: None None N/A Periwound Skin Texture: Edema: Yes No Abnormalities Noted N/A Periwound Skin No Abnormalities Noted Moist: Yes N/A Moisture: Periwound Skin Color: Erythema: Yes No Abnormalities Noted N/A Erythema Location: Circumferential N/A N/A Temperature: No Abnormality No Abnormality N/A Tenderness on Yes Yes N/A Palpation: Wound Preparation: Ulcer Cleansing: Ulcer Cleansing: N/A Rinsed/Irrigated with Rinsed/Irrigated  with Saline Saline Topical Anesthetic Topical Anesthetic Applied: Other: lidocaine Applied: Other: lidocaine 4% 4% Treatment Notes Electronic Signature(s) Signed: 07/16/2015 5:42:36 PM By: Alejandro Mulling Entered By: Alejandro Mulling on 07/16/2015 14:07:41 Mielke, [name] ([id number]) -------------------------------------------------------------------------------- Multi-Disciplinary Care Plan Details Patient Name: Schuyler, Kort A. Date of Service: 07/16/2015 1:30 PM Medical Record Number: [id number] Patient Account Number: [id number] Date of Birth/Sex: [birth year] (50 y.o. Male) Treating RN: Phillis Haggis Primary Care Physician: Azucena Cecil, DAVID Other Clinician: Referring Physician: Azucena Cecil, DAVID Treating Physician/Extender: Rudene Re in Treatment: 11 Active Inactive Abuse / Safety / Falls / Self Care Management Nursing Diagnoses: Potential for falls Goals: Patient will remain injury free Date Initiated: 04/30/2015 Goal Status: Active Interventions: Assess fall risk on admission and as needed Assess impairment of mobility on admission and as needed per policy Notes: Nutrition Nursing Diagnoses: Imbalanced nutrition Impaired glucose control: actual or potential Goals: Patient/caregiver agrees to and verbalizes understanding of need to use nutritional supplements and/or vitamins as prescribed Date Initiated: 04/30/2015 Goal Status: Active Interventions: Assess patient nutrition upon admission and as needed per policy Notes: Orientation to the Wound Care Program Nursing Diagnoses: Knowledge deficit related to the wound healing center program Hartig, BENTLIE CATANZARO ([id number]) Goals: Patient/caregiver will verbalize understanding of the Wound Healing Center Program Date Initiated: 04/30/2015 Goal Status: Active Interventions: Provide education on orientation to the wound center Notes: Pain, Acute or Chronic Nursing Diagnoses: Pain, acute or chronic: actual or  potential Potential alteration in comfort, pain Goals: Patient will verbalize adequate pain control and receive pain control interventions during procedures as needed Date Initiated: 04/30/2015 Goal Status: Active Interventions: Assess comfort goal upon admission Complete pain assessment as per visit requirements Notes: Pressure Nursing Diagnoses: Knowledge deficit related to causes and risk factors for pressure ulcer development Goals: Patient will remain free from development of additional pressure ulcers Date Initiated: 04/30/2015 Goal Status: Active Interventions: Assess: immobility, friction, shearing, incontinence upon admission and as needed Assess offloading mechanisms upon admission and as needed Notes: Wound/Skin Impairment Nursing Diagnoses: Ashline, Chukwuma A. ([id number]) Impaired tissue integrity Goals: Ulcer/skin breakdown will have a volume reduction of 30% by week 4 Date Initiated: 04/30/2015 Goal Status: Active Ulcer/skin breakdown will have a volume reduction of 50% by week 8 Date Initiated: 04/30/2015 Goal Status: Active Ulcer/skin breakdown will have a volume reduction of 80% by week 12 Date Initiated: 04/30/2015 Goal Status: Active Interventions: Assess patient/caregiver ability to obtain necessary supplies Assess ulceration(s) every visit Notes: Electronic Signature(s) Signed: 07/16/2015 5:42:36 PM By: Alejandro Mulling Entered By: Alejandro Mulling on 07/16/2015 13:59:40 Kilgour, [name] ([id number]) -------------------------------------------------------------------------------- Pain Assessment Details Patient Name: Perret, Atilla A. Date  of Service: 07/16/2015 1:30 PM Medical Record Number: [id number] Patient Account Number: [id number] Date of Birth/Sex: 05-24-1965 (50 y.o. Male) Treating RN: Phillis Haggis Primary Care Physician: Azucena Cecil, DAVID Other Clinician: Referring Physician: Azucena Cecil, DAVID Treating Physician/Extender: Rudene Re in  Treatment: 11 Active Problems Location of Pain Severity and Description of Pain Patient Has Paino Yes Site Locations Pain Location: Pain in Ulcers With Dressing Change: Yes Duration of the Pain. Constant / Intermittento Constant Rate the pain. Current Pain Level: 3 Worst Pain Level: 1 Least Pain Level: 8 Character of Pain Describe the Pain: Aching, Burning, Throbbing Pain Management and Medication Current Pain Management: Electronic Signature(s) Signed: 07/16/2015 5:42:36 PM By: Alejandro Mulling Entered By: Alejandro Mulling on 07/16/2015 13:47:13 Hollenkamp, [name] ([id number]) -------------------------------------------------------------------------------- Patient/Caregiver Education Details Patient Name: Ketchum, Wright A. Date of Service: 07/16/2015 1:30 PM Medical Record Number: [id number] Patient Account Number: [id number] Date of Birth/Gender: Jan 28, 1966 (50 y.o. Male) Treating RN: Phillis Haggis Primary Care Physician: Azucena Cecil, DAVID Other Clinician: Referring Physician: Azucena Cecil, DAVID Treating Physician/Extender: Rudene Re in Treatment: 11 Education Assessment Education Provided To: Patient Education Topics Provided Wound/Skin Impairment: Handouts: Other: change dressing as ordered Methods: Demonstration, Explain/Verbal Responses: State content correctly Electronic Signature(s) Signed: 07/16/2015 5:42:36 PM By: Alejandro Mulling Entered By: Alejandro Mulling on 07/16/2015 14:11:10 Napolitano, [name] ([id number]) -------------------------------------------------------------------------------- Wound Assessment Details Patient Name: Castile, Brooke A. Date of Service: 07/16/2015 1:30 PM Medical Record Number: [id number] Patient Account Number: [id number] Date of Birth/Sex: 1965/12/05 (50 y.o. Male) Treating RN: Phillis Haggis Primary Care Physician: Azucena Cecil, DAVID Other Clinician: Referring Physician: Azucena Cecil, DAVID Treating Physician/Extender: Rudene Re  in Treatment: 11 Wound Status Wound Number: 2 Primary Etiology: Pressure Ulcer Wound Location: Left Gluteal fold Secondary Etiology: Neuropathic Ulcer-Non Diabetic Wounding Event: Pressure Injury Wound Status: Open Date Acquired: 07/01/2015 Comorbid History: Type II Diabetes Weeks Of Treatment: 2 Clustered Wound: No Photos Photo Uploaded By: Alejandro Mulling on 07/16/2015 17:22:42 Wound Measurements Length: (cm) 6 Width: (cm) 4 Depth: (cm) 0.1 Area: (cm) 18.85 Volume: (cm) 1.885 % Reduction in Area: -20% % Reduction in Volume: 40% Epithelialization: None Tunneling: No Undermining: No Wound Description Classification: Category/Stage II Wound Margin: Thickened Exudate Amount: Large Exudate Type: Serosanguineous Exudate Color: red, brown Foul Odor After Cleansing: No Wound Bed Granulation Amount: Medium (34-66%) Exposed Structure Granulation Quality: Red Fascia Exposed: No Necrotic Amount: Medium (34-66%) Fat Layer Exposed: No Necrotic Quality: Adherent Slough Tendon Exposed: No Overturf, Roger A. ([id number]) Muscle Exposed: No Joint Exposed: No Bone Exposed: No Limited to Skin Breakdown Periwound Skin Texture Texture Color No Abnormalities Noted: No No Abnormalities Noted: No Localized Edema: Yes Erythema: Yes Erythema Location: Circumferential Moisture No Abnormalities Noted: No Temperature / Pain Temperature: No Abnormality Tenderness on Palpation: Yes Wound Preparation Ulcer Cleansing: Rinsed/Irrigated with Saline Topical Anesthetic Applied: Other: lidocaine 4%, Treatment Notes Wound #2 (Left Gluteal fold) 1. Cleansed with: Clean wound with Normal Saline 2. Anesthetic Topical Lidocaine 4% cream to wound bed prior to debridement 3. Peri-wound Care: Skin Prep 4. Dressing Applied: Foam Electronic Signature(s) Signed: 07/16/2015 5:42:36 PM By: Alejandro Mulling Entered By: Alejandro Mulling on 07/16/2015 13:53:11 Sagrero, [name]  ([id number]) -------------------------------------------------------------------------------- Wound Assessment Details Patient Name: Shober, Zebulan A. Date of Service: 07/16/2015 1:30 PM Medical Record Number: [id number] Patient Account Number: [id number] Date of Birth/Sex: 1965-05-23 (50 y.o. Male) Treating RN: Phillis Haggis Primary Care Physician: Azucena Cecil, DAVID Other Clinician: Referring Physician: Azucena Cecil, DAVID Treating Physician/Extender: Rudene Re in Treatment: 11 Wound Status Wound Number: 3 Primary Etiology: Pressure Ulcer Wound  Location: Left Scrotum Wound Status: Open Wounding Event: Pressure Injury Comorbid History: Type II Diabetes Date Acquired: 07/09/2015 Weeks Of Treatment: 0 Clustered Wound: No Photos Photo Uploaded By: Alejandro MullingPinkerton, Debra on 07/16/2015 17:22:58 Wound Measurements Length: (cm) 3 Width: (cm) 0.5 Depth: (cm) 0.1 Area: (cm) 1.178 Volume: (cm) 0.118 % Reduction in Area: % Reduction in Volume: Epithelialization: None Tunneling: No Undermining: No Wound Description Classification: Category/Stage II Wound Margin: Flat and Intact Exudate Amount: Large Exudate Type: Serous Exudate Color: amber Wound Bed Granulation Amount: Large (67-100%) Exposed Structure Granulation Quality: Pink Fascia Exposed: No Necrotic Amount: Small (1-33%) Fat Layer Exposed: No Necrotic Quality: Eschar Tendon Exposed: No Hass, Jaquavious A. ([id number]) Muscle Exposed: No Joint Exposed: No Bone Exposed: No Limited to Skin Breakdown Periwound Skin Texture Texture Color No Abnormalities Noted: No No Abnormalities Noted: No Moisture Temperature / Pain No Abnormalities Noted: No Temperature: No Abnormality Moist: Yes Tenderness on Palpation: Yes Wound Preparation Ulcer Cleansing: Rinsed/Irrigated with Saline Topical Anesthetic Applied: Other: lidocaine 4%, Treatment Notes Wound #3 (Left Scrotum) 1. Cleansed with: Clean wound with Normal Saline 2.  Anesthetic Topical Lidocaine 4% cream to wound bed prior to debridement Notes keep clean and dry Electronic Signature(s) Signed: 07/16/2015 5:42:36 PM By: Alejandro MullingPinkerton, Debra Entered By: Alejandro MullingPinkerton, Debra on 07/16/2015 13:57:34 Lohmann, Worthy [name] ([id number]) -------------------------------------------------------------------------------- Vitals Details Patient Name: Mcquade, Kelan A. Date of Service: 07/16/2015 1:30 PM Medical Record Number: [id number] Patient Account Number: [id number] Date of Birth/Sex: 11-03-1965 [age] y.o. Male) Treating RN: Phillis HaggisPinkerton, Debi Primary Care Physician: Azucena CecilSWAYNE, DAVID Other Clinician: Referring Physician: Azucena CecilSWAYNE, DAVID Treating Physician/Extender: Rudene ReBritto, Errol Weeks in Treatment: 11 Vital Signs Time Taken: 13:47 Temperature (F): 97.8 Height (in): 65 Pulse (bpm): 102 Respiratory Rate (breaths/min): 18 Blood Pressure (mmHg): 164/99 Reference Range: 80 - 120 mg / dl Electronic Signature(s) Signed: 07/16/2015 5:42:36 PM By: Alejandro MullingPinkerton, Debra Entered By: Alejandro MullingPinkerton, Debra on 07/16/2015 13:47:32

## 2015-07-17 NOTE — Progress Notes (Signed)
[name], [name] ([id number]) Visit Report for 07/16/2015 Chief Complaint Document Details Patient Name: [name], [name] Date of Service: 07/16/2015 1:30 PM Medical Record Number: [id number] Patient Account Number: [id number] Date of Birth/Sex: [birth year] (50 y.o. Male) Treating RN: Phillis Haggis Primary Care Physician: Azucena Cecil, DAVID Other Clinician: Referring Physician: Azucena Cecil, DAVID Treating Physician/Extender: Rudene Re in Treatment: 11 Information Obtained from: Patient Chief Complaint Patient is at the clinic for treatment of an open pressure ulcer to the left thigh buttock and scrotal area for about 4 weeks Electronic Signature(s) Signed: 07/16/2015 2:16:49 PM By: Evlyn Kanner MD, FACS Entered By: Evlyn Kanner on 07/16/2015 14:16:49 [name], [name] -------------------------------------------------------------------------------- HPI Details Patient Name: [name], [name] Date of Service: 07/16/2015 1:30 PM Medical Record Number: [id number] Patient Account Number: [id number] Date of Birth/Sex: 09-12-1965 (50 y.o. Male) Treating RN: Phillis Haggis Primary Care Physician: Azucena Cecil, DAVID Other Clinician: Referring Physician: Azucena Cecil, DAVID Treating Physician/Extender: Rudene Re in Treatment: 11 History of Present Illness Location: pain and ulceration in the area of his left upper thigh and buttock and in the region of his scrotum Quality: Patient reports experiencing a dull pain to affected area(s). Severity: Patient states wound are getting worse. Duration: Patient has had the wound for < 4 weeks prior to presenting for treatment Timing: Pain in wound is Intermittent (comes and goes Context: The wound would happen gradually Modifying Factors: Other treatment(s) tried include:offloading cushion for his wheelchair and appropriate mattress Associated Signs and Symptoms: Patient reports having:in the sitting position for 10-12 hours a day in his  wheelchair and the rest of the time in bed HPI Description: 50 year old male with a history of diabetes mellitus type 2 diagnosed in 2003, insulin- dependent, uncontrolled without complications. 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 hemoglobin A1c in January 2017 was 6.3% past medical history significant for diabetes mellitus type 2, GERD, muscular dystrophy and has been a former smoker. during his last visit to his endocrinologist he was diagnosed with a possible rash a injury to his left hip and hence referred to the wound center. The patient has not been a smoker for several years and though his swallowing is difficult he still eating food to the best of his ability. he has bladder and bowel control. 07/02/2015 -- the patient had some soreness in the area closer to the perineum on the left thigh and was concerned whether there was any open ulceration. Electronic Signature(s) Signed: 07/16/2015 2:17:07 PM By: Evlyn Kanner MD, FACS Entered By: Evlyn Kanner on 07/16/2015 14:17:06 [name], [name] ([id number]) -------------------------------------------------------------------------------- Physical Exam Details Patient Name: [name], [name] Date of Service: 07/16/2015 1:30 PM Medical Record Number: [id number] Patient Account Number: [id number] Date of Birth/Sex: 1965/02/04 (50 y.o. Male) Treating RN: Phillis Haggis Primary Care Physician: Azucena Cecil, DAVID Other Clinician: Referring Physician: Azucena Cecil, DAVID Treating Physician/Extender: Rudene Re in Treatment: 11 Constitutional . Pulse regular. Respirations normal and unlabored. Afebrile. . Eyes Nonicteric. Reactive to light. Ears, Nose, Mouth, and Throat Lips, teeth, and gums WNL.Marland Kitchen Moist mucosa without lesions. Neck supple and nontender. No palpable supraclavicular or cervical adenopathy. Normal sized without  goiter. Respiratory WNL. No retractions.. Breath sounds WNL, No rubs, rales, rhonchi, or wheeze.. Cardiovascular Heart rhythm and rate regular, no murmur or gallop.. Pedal Pulses WNL. No clubbing, cyanosis or edema. Lymphatic No adneopathy. No adenopathy. No adenopathy. Musculoskeletal Adexa without tenderness or enlargement.. Digits and nails  w/o clubbing, cyanosis, infection, petechiae, ischemia, or inflammatory conditions.. Integumentary (Hair, Skin) No suspicious lesions. No crepitus or fluctuance. No peri-wound warmth or erythema. No masses.Marland Kitchen Psychiatric Judgement and insight Intact.. No evidence of depression, anxiety, or agitation.. Notes the area on his left upper thigh near the gluteus region and the perineum has excoriation and I believe this is because he is retaining moisture there and the zinc oxide is not helping. I recommended using skin prep and then using a piece of Cutimed Siltec foam, to be applied on his skin and then using his underwear to hold it in place Electronic Signature(s) Signed: 07/16/2015 2:18:49 PM By: Evlyn Kanner MD, FACS Entered By: Evlyn Kanner on 07/16/2015 14:18:49 [name], [name] ([id number]) -------------------------------------------------------------------------------- Physician Orders Details Patient Name: [name], [name] Date of Service: 07/16/2015 1:30 PM Medical Record Number: [id number] Patient Account Number: [id number] Date of Birth/Sex: Feb 01, 1965 (50 y.o. Male) Treating RN: Phillis Haggis Primary Care Physician: Azucena Cecil, DAVID Other Clinician: Referring Physician: Azucena Cecil, DAVID Treating Physician/Extender: Rudene Re in Treatment: 51 Verbal / Phone Orders: Yes Clinician: Ashok Cordia, Debi Read Back and Verified: Yes Diagnosis Coding Wound Cleansing Wound #2 Left Gluteal fold o Cleanse wound with mild soap and water o May Shower, gently pat wound dry prior to applying new dressing. Anesthetic Wound #2 Left Gluteal  fold o Topical Lidocaine 4% cream applied to wound bed prior to debridement Skin Barriers/Peri-Wound Care Wound #2 Left Gluteal fold o Skin Prep o Barrier cream Secondary Dressing Wound #2 Left Gluteal fold o Foam - Cutimed Siltec Dressing Change Frequency o Change dressing every other day. Follow-up Appointments Wound #2 Left Gluteal fold o Return Appointment in 2 weeks. Off-Loading Wound #2 Left Gluteal fold o Turn and reposition every 2 hours Additional Orders / Instructions Wound #2 Left Gluteal fold o Increase protein intake. Electronic Signature(s) ABDULRAHMAN, BRACEY ([id number]) Signed: 07/16/2015 4:58:26 PM By: Evlyn Kanner MD, FACS Signed: 07/16/2015 5:42:36 PM By: Alejandro Mulling Entered By: Alejandro Mulling on 07/16/2015 14:08:32 Jankowiak, [name] ([id number]) -------------------------------------------------------------------------------- Problem List Details Patient Name: [name], [name] Date of Service: 07/16/2015 1:30 PM Medical Record Number: [id number] Patient Account Number: [id number] Date of Birth/Sex: 19-Dec-1965 (50 y.o. Male) Treating RN: Phillis Haggis Primary Care Physician: Azucena Cecil, DAVID Other Clinician: Referring Physician: Azucena Cecil, DAVID Treating Physician/Extender: Rudene Re in Treatment: 11 Active Problems ICD-10 Encounter Code Description Active Date Diagnosis G12.20 Motor neuron disease, unspecified 04/30/2015 Yes E11.622 Type 2 diabetes mellitus with other skin ulcer 04/30/2015 Yes L89.321 Pressure ulcer of left buttock, stage 1 04/30/2015 Yes E66.3 Overweight 04/30/2015 Yes Inactive Problems Resolved Problems Electronic Signature(s) Signed: 07/16/2015 2:16:42 PM By: Evlyn Kanner MD, FACS Entered By: Evlyn Kanner on 07/16/2015 14:16:42 Stanwood, [name] -------------------------------------------------------------------------------- Progress Note Details Patient Name: [name], [name] Date of Service:  07/16/2015 1:30 PM Medical Record Number: [id number] Patient Account Number: [id number] Date of Birth/Sex: 12-10-1965 (50 y.o. Male) Treating RN: Phillis Haggis Primary Care Physician: Azucena Cecil, DAVID Other Clinician: Referring Physician: Azucena Cecil, DAVID Treating Physician/Extender: Rudene Re in Treatment: 11 Subjective Chief Complaint Information obtained from Patient Patient is at the clinic for treatment of an open pressure ulcer to the left thigh buttock and scrotal area for about 4 weeks History of Present Illness (HPI) The following HPI elements were documented for the patient's wound: Location: pain and ulceration in the area of his left upper thigh and buttock and in the region of his scrotum Quality: Patient reports experiencing a dull pain to affected area(s). Severity: Patient states  wound are getting worse. Duration: Patient has had the wound for < 4 weeks prior to presenting for treatment Timing: Pain in wound is Intermittent (comes and goes Context: The wound would happen gradually Modifying Factors: Other treatment(s) tried include:offloading cushion for his wheelchair and appropriate mattress Associated Signs and Symptoms: Patient reports having:in the sitting position for 10-12 hours a day in his wheelchair and the rest of the time in bed 50 year old male with a history of diabetes mellitus type 2 diagnosed in 2003, insulin-dependent, uncontrolled without complications. 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 hemoglobin A1c in January 2017 was 6.3% past medical history significant for diabetes mellitus type 2, GERD, muscular dystrophy and has been a former smoker. during his last visit to his endocrinologist he was diagnosed with a possible rash a injury to his left hip and hence referred to the wound center. The patient has not been a smoker for several years and though  his swallowing is difficult he still eating food to the best of his ability. he has bladder and bowel control. 07/02/2015 -- the patient had some soreness in the area closer to the perineum on the left thigh and was concerned whether there was any open ulceration. Objective [name], [name] ([id number]) Constitutional Pulse regular. Respirations normal and unlabored. Afebrile. Vitals Time Taken: 1:47 PM, Height: 65 in, Temperature: 97.8 F, Pulse: 102 bpm, Respiratory Rate: 18 breaths/min, Blood Pressure: 164/99 mmHg. Eyes Nonicteric. Reactive to light. Ears, Nose, Mouth, and Throat Lips, teeth, and gums WNL.Marland Kitchen. Moist mucosa without lesions. Neck supple and nontender. No palpable supraclavicular or cervical adenopathy. Normal sized without goiter. Respiratory WNL. No retractions.. Breath sounds WNL, No rubs, rales, rhonchi, or wheeze.. Cardiovascular Heart rhythm and rate regular, no murmur or gallop.. Pedal Pulses WNL. No clubbing, cyanosis or edema. Lymphatic No adneopathy. No adenopathy. No adenopathy. Musculoskeletal Adexa without tenderness or enlargement.. Digits and nails w/o clubbing, cyanosis, infection, petechiae, ischemia, or inflammatory conditions.Marland Kitchen. Psychiatric Judgement and insight Intact.. No evidence of depression, anxiety, or agitation.. General Notes: the area on his left upper thigh near the gluteus region and the perineum has excoriation and I believe this is because he is retaining moisture there and the zinc oxide is not helping. I recommended using skin prep and then using a piece of Cutimed Siltec foam, to be applied on his skin and then using his underwear to hold it in place Integumentary (Hair, Skin) No suspicious lesions. No crepitus or fluctuance. No peri-wound warmth or erythema. No masses.. Wound #2 status is Open. Original cause of wound was Pressure Injury. The wound is located on the Left Gluteal fold. The wound measures 6cm length x 4cm width x 0.1cm  depth; 18.85cm^2 area and 1.885cm^3 volume. The wound is limited to skin breakdown. There is no tunneling or undermining noted. There is a large amount of serosanguineous drainage noted. The wound margin is thickened. There is medium (34- 66%) red granulation within the wound bed. There is a medium (34-66%) amount of necrotic tissue within the wound bed including Adherent Slough. The periwound skin appearance exhibited: Localized Edema, Erythema. The surrounding wound skin color is noted with erythema which is circumferential. Periwound temperature was noted as No Abnormality. The periwound has tenderness on palpation. [name], Worthy RancherUL A. ([id number]) Wound #3 status is Open. Original cause of wound was Pressure Injury. The wound is located on the Left Scrotum. The wound measures  3cm length x 0.5cm width x 0.1cm depth; 1.178cm^2 area and 0.118cm^3 volume. The wound is limited to skin breakdown. There is no tunneling or undermining noted. There is a large amount of serous drainage noted. The wound margin is flat and intact. There is large (67-100%) pink granulation within the wound bed. There is a small (1-33%) amount of necrotic tissue within the wound bed including Eschar. The periwound skin appearance exhibited: Moist. Periwound temperature was noted as No Abnormality. The periwound has tenderness on palpation. Assessment Active Problems ICD-10 G12.20 - Motor neuron disease, unspecified E11.622 - Type 2 diabetes mellitus with other skin ulcer L89.321 - Pressure ulcer of left buttock, stage 1 E66.3 - Overweight Plan Wound Cleansing: Wound #2 Left Gluteal fold: Cleanse wound with mild soap and water May Shower, gently pat wound dry prior to applying new dressing. Anesthetic: Wound #2 Left Gluteal fold: Topical Lidocaine 4% cream applied to wound bed prior to debridement Skin Barriers/Peri-Wound Care: Wound #2 Left Gluteal fold: Skin Prep Barrier cream Secondary Dressing: Wound #2 Left  Gluteal fold: Foam - Cutimed Siltec Dressing Change Frequency: Change dressing every other day. Follow-up Appointments: Wound #2 Left Gluteal fold: Return Appointment in 2 weeks. Off-Loading: Wound #2 Left Gluteal fold: Turn and reposition every 2 hours [name], [name] Additional Orders / Instructions: Wound #2 Left Gluteal fold: Increase protein intake. The area on his left upper thigh near the gluteus region and the perineum has excoriation and I believe this is because he is retaining moisture there and the zinc oxide is not helping. I recommended using skin prep and then using a piece of Cutimed Siltec foam, to be applied on his skin and then using his underwear to hold it in place. I have also instructed the mother to try and shave the hair around this area and we will see if this helps him more than the zinc paste and the bordered foam Electronic Signature(s) Signed: 07/16/2015 2:19:29 PM By: Evlyn Kanner MD, FACS Entered By: Evlyn Kanner on 07/16/2015 14:19:29 Toral, [name] ([id number]) -------------------------------------------------------------------------------- SuperBill Details Patient Name: [name], [name] Date of Service: 07/16/2015 Medical Record Number: [id number] Patient Account Number: [id number] Date of Birth/Sex: 04-26-1965 (50 y.o. Male) Treating RN: Phillis Haggis Primary Care Physician: Azucena Cecil, DAVID Other Clinician: Referring Physician: Azucena Cecil, DAVID Treating Physician/Extender: Rudene Re in Treatment: 11 Diagnosis Coding ICD-10 Codes Code Description G12.20 Motor neuron disease, unspecified E11.622 Type 2 diabetes mellitus with other skin ulcer L89.321 Pressure ulcer of left buttock, stage 1 E66.3 Overweight Facility Procedures CPT4 Code: 08657846 Description: 99213 - WOUND CARE VISIT-LEV 3 EST PT Modifier: Quantity: 1 Physician Procedures CPT4 Code: 9629528 Description: 99213 - WC PHYS LEVEL 3 - EST PT ICD-10  Description Diagnosis G12.20 Motor neuron disease, unspecified E11.622 Type 2 diabetes mellitus with other skin ulcer L89.321 Pressure ulcer of left buttock, stage 1 E66.3 Overweight Modifier: Quantity: 1 Electronic Signature(s) Signed: 07/16/2015 5:42:36 PM By: Alejandro Mulling Previous Signature: 07/16/2015 2:19:46 PM Version By: Evlyn Kanner MD, FACS Entered By: Alejandro Mulling on 07/16/2015 17:20:22

## 2015-07-25 ENCOUNTER — Emergency Department (HOSPITAL_COMMUNITY): Payer: Medicare Other

## 2015-07-25 ENCOUNTER — Emergency Department (HOSPITAL_COMMUNITY)
Admission: EM | Admit: 2015-07-25 | Discharge: 2015-07-25 | Disposition: A | Discharge status: Home / Self Care | Payer: Medicare Other | Attending: Emergency Medicine | Admitting: Emergency Medicine

## 2015-07-25 ENCOUNTER — Encounter (HOSPITAL_COMMUNITY): Payer: Self-pay | Admitting: Emergency Medicine

## 2015-07-25 ENCOUNTER — Other Ambulatory Visit: Payer: Self-pay

## 2015-07-25 DIAGNOSIS — Z7984 Long term (current) use of oral hypoglycemic drugs: Secondary | ICD-10-CM | POA: Diagnosis not present

## 2015-07-25 DIAGNOSIS — Z87891 Personal history of nicotine dependence: Secondary | ICD-10-CM | POA: Insufficient documentation

## 2015-07-25 DIAGNOSIS — K8020 Calculus of gallbladder without cholecystitis without obstruction: Secondary | ICD-10-CM | POA: Insufficient documentation

## 2015-07-25 DIAGNOSIS — R0789 Other chest pain: Secondary | ICD-10-CM | POA: Insufficient documentation

## 2015-07-25 DIAGNOSIS — R7401 Elevation of levels of liver transaminase levels: Secondary | ICD-10-CM

## 2015-07-25 DIAGNOSIS — Z794 Long term (current) use of insulin: Secondary | ICD-10-CM | POA: Diagnosis not present

## 2015-07-25 DIAGNOSIS — Z79899 Other long term (current) drug therapy: Secondary | ICD-10-CM | POA: Diagnosis not present

## 2015-07-25 DIAGNOSIS — K219 Gastro-esophageal reflux disease without esophagitis: Secondary | ICD-10-CM | POA: Diagnosis not present

## 2015-07-25 DIAGNOSIS — R109 Unspecified abdominal pain: Secondary | ICD-10-CM

## 2015-07-25 DIAGNOSIS — R1011 Right upper quadrant pain: Secondary | ICD-10-CM

## 2015-07-25 DIAGNOSIS — R600 Localized edema: Secondary | ICD-10-CM | POA: Insufficient documentation

## 2015-07-25 DIAGNOSIS — K2100 Gastro-esophageal reflux disease with esophagitis, without bleeding: Secondary | ICD-10-CM

## 2015-07-25 DIAGNOSIS — R740 Nonspecific elevation of levels of transaminase and lactic acid dehydrogenase [LDH]: Secondary | ICD-10-CM | POA: Diagnosis not present

## 2015-07-25 DIAGNOSIS — E1165 Type 2 diabetes mellitus with hyperglycemia: Secondary | ICD-10-CM | POA: Insufficient documentation

## 2015-07-25 DIAGNOSIS — Z87898 Personal history of other specified conditions: Secondary | ICD-10-CM

## 2015-07-25 LAB — COMPREHENSIVE METABOLIC PANEL
ALK PHOS: 53 U/L (ref 38–126)
ALT: 34 U/L (ref 17–63)
ANION GAP: 12 (ref 5–15)
AST: 44 U/L — ABNORMAL HIGH (ref 15–41)
Albumin: 3.5 g/dL (ref 3.5–5.0)
BUN: 10 mg/dL (ref 6–20)
CALCIUM: 8.8 mg/dL — AB (ref 8.9–10.3)
CHLORIDE: 101 mmol/L (ref 101–111)
CO2: 20 mmol/L — AB (ref 22–32)
Creatinine, Ser: 0.32 mg/dL — ABNORMAL LOW (ref 0.61–1.24)
GFR calc non Af Amer: >60 mL/min (ref >60)
Glucose, Bld: 361 mg/dL — ABNORMAL HIGH (ref 65–99)
POTASSIUM: 4 mmol/L (ref 3.5–5.1)
SODIUM: 133 mmol/L — AB (ref 135–145)
Total Bilirubin: 0.9 mg/dL (ref 0.3–1.2)
Total Protein: 6.9 g/dL (ref 6.5–8.1)

## 2015-07-25 LAB — URINALYSIS, ROUTINE W REFLEX MICROSCOPIC
BILIRUBIN URINE: NEGATIVE
Glucose, UA: >1000 mg/dL — AB
Hgb urine dipstick: NEGATIVE
Ketones, ur: 40 mg/dL — AB
Leukocytes, UA: NEGATIVE
NITRITE: NEGATIVE
Protein, ur: NEGATIVE mg/dL
SPECIFIC GRAVITY, URINE: 1.027 (ref 1.005–1.030)
pH: 6 (ref 5.0–8.0)

## 2015-07-25 LAB — CBC WITH DIFFERENTIAL/PLATELET
BASOS ABS: 0.1 10*3/uL (ref 0.0–0.1)
BASOS PCT: 1 %
EOS ABS: 0.1 10*3/uL (ref 0.0–0.7)
EOS PCT: 1 %
HCT: 41.1 % (ref 39.0–52.0)
Hemoglobin: 13.9 g/dL (ref 13.0–17.0)
LYMPHS ABS: 2.9 10*3/uL (ref 0.7–4.0)
Lymphocytes Relative: 29 %
MCH: 30.2 pg (ref 26.0–34.0)
MCHC: 33.8 g/dL (ref 30.0–36.0)
MCV: 89.2 fL (ref 78.0–100.0)
Monocytes Absolute: 0.6 10*3/uL (ref 0.1–1.0)
Monocytes Relative: 6 %
NEUTROS PCT: 63 %
Neutro Abs: 6.4 10*3/uL (ref 1.7–7.7)
PLATELETS: 220 10*3/uL (ref 150–400)
RBC: 4.61 MIL/uL (ref 4.22–5.81)
RDW: 13.9 % (ref 11.5–15.5)
WBC: 10 10*3/uL (ref 4.0–10.5)

## 2015-07-25 LAB — URINE MICROSCOPIC-ADD ON
Bacteria, UA: NONE SEEN
RBC / HPF: NONE SEEN RBC/hpf (ref 0–5)
Squamous Epithelial / LPF: NONE SEEN
WBC, UA: NONE SEEN WBC/hpf (ref 0–5)

## 2015-07-25 LAB — LIPASE, BLOOD: Lipase: 27 U/L (ref 11–51)

## 2015-07-25 LAB — I-STAT TROPONIN, ED: TROPONIN I, POC: 0 ng/mL (ref 0.00–0.08)

## 2015-07-25 MED ORDER — FAMOTIDINE IN NACL 20-0.9 MG/50ML-% IV SOLN
20.0000 mg | Freq: Once | INTRAVENOUS | Status: AC
  Administered 2015-07-25: 20 mg via INTRAVENOUS
  Filled 2015-07-25: qty 50

## 2015-07-25 MED ORDER — SODIUM CHLORIDE 0.9 % IV BOLUS (SEPSIS): 1000.0000 mL | Freq: Once | INTRAVENOUS | Status: DC

## 2015-07-25 MED ORDER — ONDANSETRON 4 MG PO TBDP: 4.0000 mg | ORAL_TABLET | Freq: Three times a day (TID) | ORAL | Status: DC | PRN

## 2015-07-25 MED ORDER — GI COCKTAIL ~~LOC~~
30.0000 mL | Freq: Once | ORAL | Status: AC
  Administered 2015-07-25: 30 mL via ORAL
  Filled 2015-07-25: qty 30

## 2015-07-25 MED ORDER — SODIUM CHLORIDE 0.9 % IV BOLUS (SEPSIS)
500.0000 mL | Freq: Once | INTRAVENOUS | Status: AC
  Administered 2015-07-25: 500 mL via INTRAVENOUS

## 2015-07-25 NOTE — Discharge Instructions (Signed)
Your symptoms are due to your gallbladder having a stone in it, you will ultimately need to have your gallbladder taken out to help resolve this issue. [facility] to schedule the first available appointment this week for ongoing management and ultimately surgery to take your gallbladder out. Stay well hydrated with plenty of fluids. May consider taking maalox/tums as needed for additional relief of symptoms. You may consider using over-the-counter zantac as well for symptom control. Use your home hydrocodone for pain control. Use zofran as directed as needed for nausea. Eat a low-fat diet to help with your symptoms. Avoid fried foods! Follow up with the surgeons for ongoing management of your symptoms. Return to the ER for changes or worsening symptoms, including but not limited to: intractable pain despite pain medications, intractable nausea/vomiting despite nausea medications, fevers, or inability to keep home medications down.    Abdominal Pain, Adult Many things can cause belly (abdominal) pain. Most times, the belly pain is not dangerous. Many cases of belly pain can be watched and treated at home. HOME CARE   Do not take medicines that help you go poop (laxatives) unless told to by your doctor.  Only take medicine as told by your doctor.  Eat or drink as told by your doctor. Your doctor will tell you if you should be on a special diet. GET HELP IF:  You do not know what is causing your belly pain.  You have belly pain while you are sick to your stomach (nauseous) or have runny poop (diarrhea).  You have pain while you pee or poop.  Your belly pain wakes you up at night.  You have belly pain that gets worse or better when you eat.  You have belly pain that gets worse when you eat fatty foods.  You have a fever. GET HELP RIGHT AWAY IF:   The pain does not go away within 2 hours.  You keep throwing up (vomiting).  The pain changes and is only in the  right or left part of the belly.  You have bloody or tarry looking poop. MAKE SURE YOU:   Understand these instructions.  Will watch your condition.  Will get help right away if you are not doing well or get worse.   This information is not intended to replace advice given to you by your health care provider. Make sure you discuss any questions you have with your health care provider.   Document Released: 07/05/2007 Document Revised: 02/06/2014 Document Reviewed: 09/25/2012 Elsevier Interactive Patient Education 2016 [employer]  Cholelithiasis Cholelithiasis (also called gallstones) is a form of gallbladder disease. The gallbladder is a small organ that helps you digest fats. Symptoms of gallstones are:  Feeling sick to your stomach (nausea).  Throwing up (vomiting).  Belly pain.  Yellowing of the skin (jaundice).  Sudden pain. You may feel the pain for minutes to hours.  Fever.  Pain to the touch. HOME CARE  Only take medicines as told by your doctor.  Eat a low-fat diet until you see your doctor again. Eating fat can result in pain.  Follow up with your doctor as told. Attacks usually happen time after time. Surgery is usually needed for permanent treatment. GET HELP RIGHT AWAY IF:   Your pain gets worse.  Your pain is not helped by medicines.  You have a fever and lasting symptoms for more than 2-3 days.  You have a fever and your symptoms suddenly get worse.  You keep  feeling sick to your stomach and throwing up. MAKE SURE YOU:   Understand these instructions.  Will watch your condition.  Will get help right away if you are not doing well or get worse.   This information is not intended to replace advice given to you by your health care provider. Make sure you discuss any questions you have with your health care provider.   Document Released: 07/05/2007 Document Revised: 09/18/2012 Document Reviewed: 07/10/2012 Elsevier Interactive Patient  Education 2016 [employer]  Food Choices for Gastroesophageal Reflux Disease, Adult When you have gastroesophageal reflux disease (GERD), the foods you eat and your eating habits are very important. Choosing the right foods can help ease your discomfort.  WHAT GUIDELINES DO I NEED TO FOLLOW?   Choose fruits, vegetables, whole grains, and low-fat dairy products.   Choose low-fat meat, fish, and poultry.  Limit fats such as oils, salad dressings, butter, nuts, and avocado.   Keep a food diary. This helps you identify foods that cause symptoms.   Avoid foods that cause symptoms. These may be different for everyone.   Eat small meals often instead of 3 large meals a day.   Eat your meals slowly, in a place where you are relaxed.   Limit fried foods.   Cook foods using methods other than frying.   Avoid drinking alcohol.   Avoid drinking large amounts of liquids with your meals.   Avoid bending over or lying down until 2-3 hours after eating.  WHAT FOODS ARE NOT RECOMMENDED?  These are some foods and drinks that may make your symptoms worse: Vegetables Tomatoes. Tomato juice. Tomato and spaghetti sauce. Chili peppers. Onion and garlic. Horseradish. Fruits Oranges, grapefruit, and lemon (fruit and juice). Meats High-fat meats, fish, and poultry. This includes hot dogs, ribs, ham, sausage, salami, and bacon. Dairy Whole milk and chocolate milk. Sour cream. Cream. Butter. Ice cream. Cream cheese.  Drinks Coffee and tea. Bubbly (carbonated) drinks or energy drinks. Condiments Hot sauce. Barbecue sauce.  Sweets/Desserts Chocolate and cocoa. Donuts. Peppermint and spearmint. Fats and Oils High-fat foods. This includes [place] fries and potato chips. Other Vinegar. Strong spices. This includes black pepper, white pepper, red pepper, cayenne, curry powder, cloves, ginger, and chili powder. The items listed above may not be a complete list of foods and drinks to avoid.  Contact your dietitian for more information.   This information is not intended to replace advice given to you by your health care provider. Make sure you discuss any questions you have with your health care provider.   Document Released: 07/18/2011 Document Revised: 02/06/2014 Document Reviewed: 11/20/2012 Elsevier Interactive Patient Education [employer] Inc[employer]

## 2015-07-25 NOTE — ED Provider Notes (Signed)
CSN: [id number]     Arrival date & time 07/25/15  1223 History   First MD Initiated Contact with Patient 07/25/15 1233     Chief Complaint  Patient presents with  . Flank Pain  . Chest Pain     (Consider location/radiation/quality/duration/timing/severity/associated sxs/prior Treatment) HPI Comments: [name] is a 50 y.o. male with a PMHx of DM2, GERD, HLD, and muscular dystrophy/Spinal Muscular Atrophy type III, who presents to the ED with complaints of sudden onset right flank pain that began around 5 AM after awakening from rest, that then became central CP that he states felt like heartburn. He describes the pain as 8/10 constant tightness that initially started in the right flank that radiated into the entire back and into his central chest, worse with sitting up, and unrelieved with aspirin and Nexium. He states the pain is somewhat improved upon arrival, but feels that the aspirin tablet got stuck in the back of his throat. He states that initially he felt like he had some heartburn when the pain first began but the Nexium has not helped. Reports that when he took his blood pressure this morning it was very high, took his lisinopril shortly thereafter, but approximately 10 minutes later it was still reading as high so his primary care doctor told him to come here. +FHx of CABG in his father.   Of note, reports that he's had ongoing BLE swelling x2-3 months since his BP started rising. Wears compression stockings and elevates his legs which helps some. He is also on lasix. He is a nonsmoker.  He denies any fevers, chills, diaphoresis, lightheadedness, shortness breath, cough, new or worsening leg swelling, recent travel or surgery, history of DVT/PE, abdominal pain, nausea, vomiting, diarrhea, constipation, melena, hematochezia, dysuria, hematuria, increased her frequency, numbness, or tingling. Only takes lisinopril for BP. He ate a spoonful of apple sauce and a glucerna earlier  today.  Patient is a 50 y.o. male presenting with flank pain and chest pain. The history is provided by the patient, medical records and a parent. No language interpreter was used.  Flank Pain This is a new problem. The current episode started today. The problem occurs constantly. The problem has been gradually improving. Associated symptoms include chest pain. Pertinent negatives include no abdominal pain, arthralgias, chills, diaphoresis, fever, myalgias, nausea, numbness, urinary symptoms or vomiting. Exacerbated by: sitting up. Treatments tried: ASA and nexium. The treatment provided no relief.  Chest Pain Associated symptoms: no abdominal pain, no diaphoresis, no fever, no nausea, no numbness, no shortness of breath and not vomiting     Past Medical History  Diagnosis Date  . Diabetes mellitus without complication (HCC)     DM Type II   . GERD (gastroesophageal reflux disease)   . Hypercholesterolemia   . Muscular dystrophy [facility])    Past Surgical History  Procedure Laterality Date  . Muscle bisopy     Family History  Problem Relation Age of Onset  . Diabetes Mother   . Colon polyps Mother   . Heart disease Father     CAD  . Diabetes Father    Social History  Substance Use Topics  . Smoking status: Former Games developer  . Smokeless tobacco: Never Used  . Alcohol Use: No    Review of Systems  Constitutional: Negative for fever, chills and diaphoresis.  Respiratory: Negative for shortness of breath.   Cardiovascular: Positive for chest pain. Negative for leg swelling (nothing new/different).  Gastrointestinal: Negative for nausea, vomiting, abdominal  pain, diarrhea, constipation and blood in stool.       +heartburn  Genitourinary: Positive for flank pain. Negative for dysuria, frequency and hematuria.  Musculoskeletal: Negative for myalgias and arthralgias.  Skin: Negative for color change.  Allergic/Immunologic: Positive for immunocompromised state (diabetic).  Neurological:  Negative for light-headedness and numbness.  Psychiatric/Behavioral: Negative for confusion.   10 Systems reviewed and are negative for acute change except as noted in the HPI.    Allergies  Penicillins  Home Medications   Prior to Admission medications   Medication Sig Start Date End Date Taking? Authorizing Provider  BD PEN NEEDLE NANO U/F 32G X 4 MM MISC USE ONE NEEDLE DAILY WITH LANTUS SOLOSTAR AS DIRECTED 04/01/15   Carlus Pavlov, MD  CANASA 1000 MG suppository Place 1,000 mg rectally at bedtime as needed (for ulcerative proctitis).  12/10/14   Historical Provider, MD  furosemide (LASIX) 20 MG tablet Take 1 tablet (20 mg total) by mouth 2 (two) times daily. 05/28/15   Joycie Peek, PA-C  glipiZIDE (GLUCOTROL XL) 5 MG 24 hr tablet TAKE ONE TABLET BY MOUTH EVERY MORNING WITH BREAKFAST 10/20/14   Carlus Pavlov, MD  glipiZIDE (GLUCOTROL) 5 MG tablet Take 1 tablet (5 mg total) by mouth daily before supper. Patient not taking: Reported on 05/28/2015 12/28/14   Carlus Pavlov, MD  glucose blood (ONETOUCH VERIO) test strip Use to test blood sugar 2 times daily as instructed. 02/11/14   Carlus Pavlov, MD  HYDROcodone-acetaminophen (HYCET) 7.5-325 mg/15 ml solution Take 15 mLs by mouth at bedtime.     Historical Provider, MD  Insulin Glargine (TOUJEO SOLOSTAR) 300 UNIT/ML SOPN Inject 34 Units into the skin at bedtime. Patient not taking: Reported on 05/28/2015 03/30/15   Carlus Pavlov, MD  lisinopril (PRINIVIL,ZESTRIL) 2.5 MG tablet Take 2.5 mg by mouth daily.     Historical Provider, MD  omeprazole (PRILOSEC) 40 MG capsule Take 1 capsule (40 mg total) by mouth daily. Patient not taking: Reported on 05/28/2015 06/25/14   Carlus Pavlov, MD  [facility] DELICA LANCETS 33G MISC Use to test blood sugar 2 times daily as instructed. 02/11/14   Carlus Pavlov, MD  potassium chloride 20 MEQ/15ML (10%) SOLN TAKE 1 TABLESPOONFUL (15 ML) ONCE A DAY ORALLY 03/11/15   Historical Provider, MD   rosuvastatin (CRESTOR) 5 MG tablet Take 5 mg by mouth at bedtime.     Historical Provider, MD  sucralfate (CARAFATE) 1 GM/10ML suspension Take 10 mLs (1 g total) by mouth 4 (four) times daily -  with meals and at bedtime. Patient not taking: Reported on 05/28/2015 01/30/15   April Palumbo, MD  sulfamethoxazole-trimethoprim (BACTRIM DS,SEPTRA DS) 800-160 MG tablet Take 2 tablets by mouth 2 (two) times daily. 7 Day Course. 05/22/15   Historical Provider, MD  sulfamethoxazole-trimethoprim (BACTRIM,SEPTRA) 200-40 MG/5ML suspension Take 80 mLs by mouth 2 (two) times daily. 7 Day Course. 05/27/15   Historical Provider, MD  TOUJEO SOLOSTAR 300 UNIT/ML SOPN INJECT 34 UNITS INTO THE SKIN AT BEDTIME. 05/03/15   Carlus Pavlov, MD  TRULICITY 1.5 MG/0.5ML SOPN INJECT 1.5MG  INTO THE SKIN ONCE WEEKLY 05/18/15   Carlus Pavlov, MD   Triage VS: BP 180/106 mmHg  Pulse 109  Temp(Src) 98.4 F (36.9 C) (Oral)  Resp 16  Ht 5\' 7"  (1.702 m)  Wt 74.844 kg  BMI 25.84 kg/m2  SpO2 100% Exam VS: BP 164/85 mmHg  Pulse 94  Temp(Src) 98.4 F (36.9 C) (Oral)  Resp 18  Ht 5\' 7"  (1.702 m)  Wt 74.844  kg  BMI 25.84 kg/m2  SpO2 97%  Physical Exam  Constitutional: He is oriented to person, place, and time. Vital signs are normal. He appears well-developed and well-nourished.  Non-toxic appearance. No distress.  Afebrile, nontoxic, NAD, BP 180/106 improved down to 164/85  HENT:  Head: Normocephalic and atraumatic.  Mouth/Throat: Oropharynx is clear and moist and mucous membranes are normal.  Eyes: Conjunctivae and EOM are normal. Right eye exhibits no discharge. Left eye exhibits no discharge.  Neck: Normal range of motion. Neck supple.  Cardiovascular: Regular rhythm, normal heart sounds and intact distal pulses.  Tachycardia present.  Exam reveals no gallop and no friction rub.   No murmur heard. HR 90-100s during exam, reg rhythm, nl s1/s2, no m/r/g, distal pulses intact, trace to 1+ b/l pitting edema without  unilateral LE swelling   Pulmonary/Chest: Effort normal and breath sounds normal. No respiratory distress. He has no decreased breath sounds. He has no wheezes. He has no rhonchi. He has no rales. He exhibits no tenderness, no crepitus, no deformity and no retraction.  CTAB in all lung fields, no w/r/r, no hypoxia or increased WOB, speaking in full sentences, SpO2 100% on RA Chest wall nonTTP without crepitus, deformities, or retractions   Abdominal: Soft. Normal appearance and bowel sounds are normal. He exhibits no distension. There is tenderness in the right upper quadrant and epigastric area. There is CVA tenderness and positive Murphy's sign. There is no rigidity, no rebound, no guarding and no tenderness at McBurney's point.    Soft, nondistended, +BS throughout, with mild RUQ/epigastric TTP, no r/g/r, +murphy's, neg mcburney's, with mild R-sided CVA TTP   Musculoskeletal:  Trace b/l pedal edema noted, at baseline per family  Neurological: He is alert and oriented to person, place, and time. No sensory deficit.  Skin: Skin is warm, dry and intact. No rash noted.  Psychiatric: He has a normal mood and affect.  Nursing note and vitals reviewed.   ED Course  Procedures (including critical care time) Labs Review Labs Reviewed  COMPREHENSIVE METABOLIC PANEL - Abnormal; Notable for the following:    Sodium 133 (*)    CO2 20 (*)    Glucose, Bld 361 (*)    Creatinine, Ser 0.32 (*)    Calcium 8.8 (*)    AST 44 (*)    All other components within normal limits  URINALYSIS, ROUTINE W REFLEX MICROSCOPIC (NOT AT [facility]) - Abnormal; Notable for the following:    Glucose, UA >1000 (*)    Ketones, ur 40 (*)    All other components within normal limits  CBC WITH DIFFERENTIAL/PLATELET  LIPASE, BLOOD  URINE MICROSCOPIC-ADD ON  Rosezena SensorI-STAT TROPOININ, ED    Imaging Review Dg Chest 2 View  07/25/2015  CLINICAL DATA:  Right flank pain and burning pain in the chest. EXAM: CHEST  2 VIEW COMPARISON:   01/30/2015 FINDINGS: The heart size and mediastinal contours are within normal limits. There is no evidence of pulmonary edema, consolidation, pneumothorax, nodule or pleural fluid. Stable rightward convex thoracic scoliosis and osteopenia. IMPRESSION: No active cardiopulmonary disease. Electronically Signed   By: Irish LackGlenn  Yamagata M.D.   On: 07/25/2015 13:58   [place] Abdomen Complete  07/25/2015  CLINICAL DATA:  Abdominal and flank pain. EXAM: ABDOMEN ULTRASOUND COMPLETE COMPARISON:  None. FINDINGS: Study is limited by body habitus and inability to properly position the patient for the exam. Some structures are obscured by overlying bowel gas. Gallbladder: 9 mm stone in the gallbladder neck region. Gallbladder otherwise unremarkable.  No gallbladder wall thickening, pericholecystic fluid or other signs of acute cholecystitis. Common bile duct: Diameter: Normal at 5 mm Liver: Diffusely echogenic indicating fatty infiltration. No focal lesion identified. Some portions obscured by overlying bowel gas. IVC: Obscured by overlying bowel gas. Pancreas: Majority is obscured by overlying bowel gas. No peripancreatic fluid seen. Spleen: Size and appearance within normal limits. Right Kidney: Length: 11.1 cm. Echogenicity within normal limits. No mass or hydronephrosis visualized. Left Kidney: Length: 12.3 cm. Echogenicity within normal limits. No mass or hydronephrosis visualized. Abdominal aorta: Obscured by overlying bowel. Other findings: None. IMPRESSION: 1. No acute findings.  Study limitations detailed above. 2. Fatty infiltration of the liver. 3. Cholelithiasis without evidence of acute cholecystitis. Electronically Signed   By: Bary RichardStan  Maynard M.D.   On: 07/25/2015 14:27   I have personally reviewed and evaluated these images and lab results as part of my medical decision-making.   EKG Interpretation   Date/Time:  Sunday July 25 2015 12:27:39 EDT Ventricular Rate:  92 PR Interval:    QRS Duration: 83 QT  Interval:  340 QTC Calculation: 421 R Axis:   -16 Text Interpretation:  Sinus rhythm Abnormal R-wave progression, early  transition Left ventricular hypertrophy Baseline wander in lead(s) III  Confirmed by BEATON  MD, ROBERT ([id number]) on 07/25/2015 1:14:38 PM      MDM   Final diagnoses:  Right flank pain  Atypical chest pain  Gastroesophageal reflux disease with esophagitis  RUQ abdominal pain  Calculus of gallbladder without cholecystitis without obstruction  Elevated transaminase level  Type 2 diabetes mellitus with hyperglycemia, with long-term current use of insulin (HCC)  History of peripheral edema    50 y.o. male here with R flank pain that then became generalized back and chest pain beginning around 5am while at rest. Some heartburn. Noted his BP was high, took lisinopril but it was still elevated so came here. On exam, mildly hypertensive in the 180s/100s, some mild RUQ/epigastric TTP, +Murphy's on exam. Has had ongoing BLE swelling x2-3 months onset when his BP started beginning to rise, no new/unilateral LE swelling. HR 90-100s during exam depending on whether he's trying to move or not, no hypoxia or complaints of SOB. EKG without ischemic findings. Will obtain labs, U/A, CXR, abd u/s, and give GI cocktail/pepcid and gentle hydration. Pt declines wanting pain meds here. Will reassess shortly  3:44 PM  U/S reveals 9mm stone in gallbladder neck without evidence of cholecystitis on U/S but body habitus limits exam. CBC w/diff WNL. CMP with minimally elevated AST 44 which has been seen previously but no other LFT changes, gluc 361 without anion gap, Na 133 which corrects with gluc correction. Lipase WNL. Trop WNL. EKG without ischemic findings. CXR WNL. Pt just now getting meds, so unable to tell me if symptoms improved after GI cocktail, but continues to decline pain meds. Given that he has a 9mm gallstone with elevated AST, will touch base with [facility] to discuss urgent cholecystectomy vs  close outpatient f/up to schedule cholecystectomy. U/A just now being sent, will await this as well. Of note, BP improving.   4:10 PM Still awaiting return page from [facility], will repage.   4:32 PM Dr. Lindie SpruceWyatt returning page, feels that he could have close outpt f/up, call tomorrow for first available appt. He can be sent home with something for pain and nausea just in case that develops. Of note, pt feeling much better after GI cocktail and pepcid. Has hycet at home for pain, can use  this, would only need zofran ODT rx to go home with. Awaiting U/A, will reassess after this results.  5:59 PM U/A with a few ketones but no evidence of infection; doubt DKA since no changes in bicarb or anion gap. Likely just from slight dehydration. Discussed Tums/Maalox/Zantac for symptom control, low-fat diet discussed, f/up with [facility] this week for outpt management of symptomatic cholelithiasis, zofran rx given in case he gets nauseated, has pain meds at home. Discussed staying hydrated. I explained the diagnosis and have given explicit precautions to return to the ER including for any other new or worsening symptoms. The patient understands and accepts the medical plan as it's been dictated and I have answered their questions. Discharge instructions concerning home care and prescriptions have been given. The patient is STABLE and is discharged to home in good condition.   BP 168/98 mmHg  Pulse 98  Temp(Src) 98.4 F (36.9 C) (Oral)  Resp 15  Ht  (1.702 m)  Wt 74.844 kg  BMI 25.84 kg/m2  SpO2 99%  Meds ordered this encounter  Medications  . gi cocktail (Maalox,Lidocaine,Donnatal)    Sig:   . sodium chloride 0.9 % bolus 500 mL    Sig:   . famotidine (PEPCID) IVPB 20 mg premix    Sig:   . ondansetron (ZOFRAN ODT) 4 MG disintegrating tablet    Sig: Take 1 tablet (4 mg total) by mouth every 8 (eight) hours as needed for nausea or vomiting.    Dispense:  20 tablet    Refill:  0    Order Specific Question:   Supervising Provider    Answer:  Eber Hong [[id number]]     Shannelle Alguire Camprubi-Soms, PA-C 07/25/15 1800  Nelva Nay, MD 07/29/15 [phone]

## 2015-07-25 NOTE — ED Notes (Signed)
Pt reports when he woke up this morning he had pain in right flank area. Once he got out the bed his entire back began to hurt and experienced burning sensation in chest. Pt took nexium and chest pain became worse. Pt reports chest pain and flank areas have eased off but has pain in throat from when he took the aspirin his mother gave him.

## 2015-07-25 NOTE — ED Notes (Signed)
Received pt from home with c/o left flank pain and mid chest pain onset today. Pt was given 324 mg of ASA by mom.

## 2015-07-25 NOTE — ED Notes (Signed)
D/C instructions reviewed with patient and family. Both deny any questions and verbalize understanding of the follow up care needed. Pt. Will leave with family in his own wheelchair brought from home with family. Patient denies any pain at this time and vitals are stable

## 2015-10-30 ENCOUNTER — Other Ambulatory Visit: Payer: Self-pay | Admitting: Internal Medicine

## 2015-11-14 ENCOUNTER — Other Ambulatory Visit: Payer: Self-pay | Admitting: Internal Medicine

## 2015-11-15 ENCOUNTER — Other Ambulatory Visit: Payer: Self-pay

## 2015-11-15 NOTE — Telephone Encounter (Signed)
Patient need a refill of TRULICITY 1.5 MG/0.5ML SOPN  Until his next appointment.

## 2015-12-29 ENCOUNTER — Ambulatory Visit (INDEPENDENT_AMBULATORY_CARE_PROVIDER_SITE_OTHER): Payer: Medicare Other | Admitting: Internal Medicine

## 2015-12-29 ENCOUNTER — Encounter: Payer: Self-pay | Admitting: Internal Medicine

## 2015-12-29 VITALS — BP 122/64 | HR 93

## 2015-12-29 DIAGNOSIS — E119 Type 2 diabetes mellitus without complications: Secondary | ICD-10-CM | POA: Diagnosis not present

## 2015-12-29 DIAGNOSIS — Z794 Long term (current) use of insulin: Secondary | ICD-10-CM | POA: Diagnosis not present

## 2015-12-29 DIAGNOSIS — E1165 Type 2 diabetes mellitus with hyperglycemia: Secondary | ICD-10-CM | POA: Diagnosis not present

## 2015-12-29 LAB — POCT GLYCOSYLATED HEMOGLOBIN (HGB A1C): HEMOGLOBIN A1C: 10

## 2015-12-29 MED ORDER — INSULIN GLARGINE 300 UNIT/ML ~~LOC~~ SOPN: 38.0000 [IU] | PEN_INJECTOR | Freq: Every day | SUBCUTANEOUS | 3 refills | Status: DC

## 2015-12-29 MED ORDER — GLIPIZIDE ER 5 MG PO TB24: ORAL_TABLET | ORAL | 3 refills | Status: DC

## 2015-12-29 NOTE — Progress Notes (Signed)
Patient ID: [name], male   DOB: 1965-08-01, 50 y.o.   MRN: [id number]  HPI: [name] is a 50 y.o.-year-old male, returning to f/u for DM2, dx in 2003, insulin-dependent, uncontrolled, without complications. Last visit 10 mo ago. He has [employer] insurance.  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 In the last 2 years, his swallowing is more impaired. Mother is primary caregiver. She has a pressure ulcer.  Hemoglobin A1c levels reviewed per records from PCP and per Epic records: 06/21/2015: HbA1c 8.2% 02/23/2015: HbA1C: 6.3% Lab Results  Component Value Date   HGBA1C 6.9 12/28/2014   HGBA1C 6.8 09/25/2014   HGBA1C 5.8 06/25/2014   12/15/2013: HbA1c 8.9% 06/12/2013: HbA1c 7.8% 12/13/2012: HbA1c 7.5% 12/07/2011: HbA1c 6.9% 05/30/2010: HbA1c 8.3%  Pt is on a regimen of: - Toujeo 34 units at bedtime. - Glipizide XL 5 mg before breakfast Not taking Glipizide instant release 5 mg before dinner - Trulicity 1.5 mg weekly  He tried Metformin >> diarrhea. We tried again Metformin >> diarrhea >> now stopped.  He was on Onglyza >> now stopped.  Pt's mom is not checking sugars at home lately as she was sick and was in the hospital frequently. From last visit:  - am:116-185 >> 112, 121 >> 117, 177, 182 >> 138-177 >> n/c - 2h after b'fast: 146-183 >> 123-197 - before lunch: 205-390 >> 119-152, 186 >> 125-192, 267 >> 126, 129 >> 111-158 - 2h after lunch: 210-268 >> n/c >> 160, 162 >> 125, 230 >> 119, 221 >> 249x1 - before dinner: 238-315 >> 129 >> 117, 162 >> 121, 203 >> 119-129 >> 112, 167 - after dinner: 222-278 >> 204 >> n/c >> 112-137 >> 126, 138, 214-243 >> 229, 252 - bedtime: 197 >> n/c >> 96, 138, 182, 233  No lows; he has hypoglycemia awareness at 70.   Pt's meals are: - Breakfast: skips - Lunch: soup, sandwich, burger, diet Mtn dew - Dinner: grilled chicken, veggies, starch, diet sods - Snacks: PB, cookie  - no CKD, last  BUN/creatinine:  Lab Results  Component Value Date   BUN 10 07/25/2015   CREATININE 0.32 (L) 07/25/2015   - last set of lipids: Received labs from PCP, drawn on 02/23/2015: - Lipid panel: 138/37/306/40  Lab Results  Component Value Date   CHOL 146 12/15/2013   HDL 33 (A) 12/15/2013   LDLCALC 60 12/15/2013   TRIG 266 (A) 12/15/2013   CHOLHDL 4.2 05/30/2010   - last eye exam was in beginning 2017. 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 - no numbness and tingling in his feet. Has good sensation, but cannot move. He cannot move his legs  ROS: Constitutional: no weight gain/loss, no fatigue, no subjective hyperthermia/hypothermia Eyes: no blurry vision, no xerophthalmia ENT: no sore throat, no nodules palpated in throat, + dysphagia/no odynophagia, no hoarseness Cardiovascular: no CP/SOB/no palpitations/+ leg swelling Respiratory: no cough/SOB Gastrointestinal: n N/V/+ D/no C/ + acid reflux Musculoskeletal: no muscle/joint aches Skin: no rashes Neurological: no tremors/numbness/tingling/dizziness  I reviewed pt's medications, allergies, PMH, social hx, family hx, and changes were documented in the history of present illness. Otherwise, unchanged from my initial visit note.  Past Surgical History:  Procedure Laterality Date  . muscle bisopy     History   Social History  . Marital Status: single    Spouse Name: N/A    Number of Children: 0   Occupational History  . disabled  Social History Main Topics  . Smoking status: Former Games developer  . Smokeless tobacco: Not on file  . Alcohol Use: No  . Drug Use: No   Current Outpatient Prescriptions on File Prior to Visit  Medication Sig Dispense Refill  . BD PEN NEEDLE NANO U/F 32G X 4 MM MISC USE ONE NEEDLE DAILY WITH LANTUS SOLOSTAR AS DIRECTED 100 each 3  . CANASA 1000 MG suppository Place 1,000 mg rectally at bedtime as needed (for ulcerative proctitis).   1  . glucose blood (ONETOUCH VERIO) test strip Use to test blood sugar 2 times daily as  instructed. 100 each 11  . HYDROcodone-acetaminophen (HYCET) 7.5-325 mg/15 ml solution Take 10 mLs by mouth at bedtime.    Marland Kitchen lisinopril (PRINIVIL,ZESTRIL) 10 MG tablet Take 10 mg by mouth daily.  3  . omeprazole (PRILOSEC) 40 MG capsule Take 1 capsule (40 mg total) by mouth daily. 14 capsule 1  . ondansetron (ZOFRAN ODT) 4 MG disintegrating tablet Take 1 tablet (4 mg total) by mouth every 8 (eight) hours as needed for nausea or vomiting. 20 tablet 0  . ONETOUCH DELICA LANCETS 33G MISC Use to test blood sugar 2 times daily as instructed. 100 each 11  . potassium chloride 20 MEQ/15ML (10%) SOLN TAKE 1 TABLESPOONFUL (15 ML) ONCE A DAY ORALLY  6  . rosuvastatin (CRESTOR) 5 MG tablet Take 5 mg by mouth at bedtime.     . TRULICITY 1.5 MG/0.5ML SOPN INJECT 1.5 MG INTO THE SKIN ONCE WEEKLY 6 pen 1   No current facility-administered medications on file prior to visit.     Allergies  Allergen Reactions  . Bactrim Ds [Sulfamethoxazole-Trimethoprim] Other (See Comments)    Pill causes gagging  . Carafate [Sucralfate] Other (See Comments)    Gagging  . Latex Other (See Comments)    Extremely irritated, fire engine red skin  . Penicillins Hives    Has patient had a PCN reaction causing immediate rash, facial/tongue/throat swelling, SOB or lightheadedness with hypotension:unknown Has patient had a PCN reaction causing severe rash involving mucus membranes or skin necrosis:unknown Has patient had a PCN reaction that required hospitalization:unknown Has patient had a PCN reaction occurring within the last 10 years:infant reaction If all of the above answers are "NO", then may proceed with Cephalosporin use.    Family History  Problem Relation Age of Onset  . Diabetes Mother   . Colon polyps Mother   . Heart disease Father     CAD  . Diabetes Father    PE: BP 122/64   Pulse 93   SpO2 98%  There is no height or weight on file to calculate BMI. Wt Readings from Last 3 Encounters:  07/25/15 165  lb (74.8 kg)  02/11/14 165 lb (74.8 kg)   Constitutional: overweight, in NAD, in wheelchair. Moves few mm groups, but can talk and eat (see HPI)  Eyes: PERRLA, EOMI, no exophthalmos ENT: moist mucous membranes, no thyromegaly, no cervical lymphadenopathy Cardiovascular: RRR, No MRG, + pitting LE edema Respiratory: CTA B Gastrointestinal: abdomen soft, NT, ND, BS+ Musculoskeletal: tetraparesis Skin: moist, warm Neurological: tetraparesis  ASSESSMENT: 1. DM2, insulin-dependent, uncontrolled, without long term complications, but with Hyperglycemia  2. L hip pressure rash/sore - resolved  PLAN:  1. Patient with long-standing, uncontrolled diabetes, on oral antidiabetic regimen + Lantus + weekly GLP1 R agonist. They are not checking sugars , but HbA1c today is much higher, at 10% >> will increase Toujeo and Glipizide XL in am and add  5 mg before dinner. - I advised him to: Patient Instructions  Please increase Toujeo to 38 units at bedtime Increase Glipizide XL to 10 mg in am.  If the sugars at bedtime are >180, please add 5 mg Glipizide before dinner.  Please continue Trulicity 1.5 mg weekly.  Check sugars 2x a day, rotating check times.  Please return in 1.5 months with your sugar log.   - continue checking sugars at different times of the day - check 2 times a day, rotating checks - given more sugar logs  - advised for yearly eye exams - he is UTD - Return to clinic in 1.5 mo with sugar log   2. L pressure sore - resolved - still using Neosporin  Carlus Pavlovristina Caci Orren, MD PhD [facility]

## 2015-12-29 NOTE — Patient Instructions (Addendum)
Please increase Toujeo to 38 units at bedtime Increase Glipizide XL to 10 mg in am.  If the sugars at bedtime are >180, please add 5 mg Glipizide before dinner.  Please continue Trulicity 1.5 mg weekly.  Check sugars 2x a day, rotating check times.  Please return in 1.5 months with your sugar log.

## 2015-12-31 ENCOUNTER — Telehealth: Payer: Self-pay | Admitting: Internal Medicine

## 2015-12-31 ENCOUNTER — Other Ambulatory Visit: Payer: Self-pay | Admitting: *Deleted

## 2015-12-31 MED ORDER — GLUCOSE BLOOD VI STRP: ORAL_STRIP | 11 refills | Status: DC

## 2015-12-31 NOTE — Telephone Encounter (Signed)
Pt mother called back and said that the Pt's blood sugars are close to 400 and he is getting worried because the medication will not be in until Monday.

## 2015-12-31 NOTE — Telephone Encounter (Signed)
Pt called and said that the Glipizide XL pill is too big for the Pt to swallow and that the pharmacist is going to be ordering a smaller size that he can take, but he won't be able to start until Monday.  She also said that he needs a refill of his test Strips and Needles sent to [facility] [place].

## 2015-12-31 NOTE — Telephone Encounter (Signed)
Noted  OK to refill

## 2015-12-31 NOTE — Telephone Encounter (Signed)
OK to go up on Toujeo 5 more units every 2 days and let me know how the sugars change after starting Glipizide.

## 2016-01-04 NOTE — Telephone Encounter (Signed)
I contacted the patient's mom and advised of message. She voiced understanding and had no further questions at this time.

## 2016-01-16 ENCOUNTER — Other Ambulatory Visit: Payer: Self-pay | Admitting: Internal Medicine

## 2016-02-09 ENCOUNTER — Encounter: Payer: Self-pay | Admitting: Internal Medicine

## 2016-02-09 ENCOUNTER — Ambulatory Visit (INDEPENDENT_AMBULATORY_CARE_PROVIDER_SITE_OTHER): Payer: Medicare Other | Admitting: Internal Medicine

## 2016-02-09 VITALS — BP 132/90 | HR 95

## 2016-02-09 DIAGNOSIS — L89321 Pressure ulcer of left buttock, stage 1: Secondary | ICD-10-CM | POA: Diagnosis not present

## 2016-02-09 DIAGNOSIS — Z794 Long term (current) use of insulin: Secondary | ICD-10-CM

## 2016-02-09 DIAGNOSIS — E1165 Type 2 diabetes mellitus with hyperglycemia: Secondary | ICD-10-CM | POA: Diagnosis not present

## 2016-02-09 MED ORDER — INSULIN ASPART 100 UNIT/ML FLEXPEN: 10.0000 [IU] | PEN_INJECTOR | Freq: Three times a day (TID) | SUBCUTANEOUS | 5 refills | Status: DC

## 2016-02-09 MED ORDER — INSULIN GLARGINE 300 UNIT/ML ~~LOC~~ SOPN: 80.0000 [IU] | PEN_INJECTOR | Freq: Every day | SUBCUTANEOUS | 3 refills | Status: DC

## 2016-02-09 MED ORDER — GLUCOSE BLOOD VI STRP: ORAL_STRIP | 3 refills | Status: DC

## 2016-02-09 NOTE — Progress Notes (Signed)
Patient ID: [name] Robertaul A Clare, male   DOB: 29-Jan-1966, 51 y.o.   MRN: [id number]  HPI: [name] is a 51 y.o.-year-old male, returning to f/u for DM2, dx in 2003, insulin-dependent, uncontrolled, without complications. Last visit 1.5 mo ago. He has [employer] insurance.  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 In the last 2 years, his swallowing is more impaired. Mother is primary caregiver.  At last visit, his diabetes control worsened with his mom being sick and in and out of the hospital. Sugars now slightly better after greatly increasing his Toujeo.  Hemoglobin A1c levels reviewed per records from PCP and per Epic records: Lab Results  Component Value Date   HGBA1C 10.0 12/29/2015   HGBA1C 6.9 12/28/2014   HGBA1C 6.8 09/25/2014  06/21/2015: HbA1c 8.2% 02/23/2015: HbA1C: 6.3% 12/15/2013: HbA1c 8.9% 06/12/2013: HbA1c 7.8% 12/13/2012: HbA1c 7.5% 12/07/2011: HbA1c 6.9% 05/30/2010: HbA1c 8.3%  Pt is on a regimen of: - Toujeo 38 >> increased to 93 units at bedtime since last visit - Glipizide XL to 10 mg in am and 5 mg Glipizide before dinner. - Trulicity 1.5 mg weekly. He tried Metformin >> diarrhea. We tried again Metformin >> diarrhea >> now stopped.  He was on Onglyza >> now stopped.  Pt's mom started to check his sugars 1-2 a day: - am:116-185 >> 112, 121 >> 117, 177, 182 >> 138-177 >> n/c >> 145-222, 261 - 2h after b'fast: 146-183 >> 123-197 >> 170-203 - before lunch: 205-390 >> 119-152, 186 >> 125-192, 267 >> 126, 129 >> 111-158 >> 136-173, 203 - 2h after lunch: 210-268 >> n/c >> 160, 162 >> 125, 230 >> 119, 221 >> 249x1 >> 164, 206, 363 - before dinner: 238-315 >> 129 >> 117, 162 >> 121, 203 >> 119-129 >> 112, 167 >> 165-256 - after dinner: 222-278 >> 204 >> n/c >> 112-137 >> 126, 138, 214-243 >> 229, 252 >> 209, 223 - bedtime: 197 >> n/c >> 96, 138, 182, 233  No lows; he has hypoglycemia awareness at 70.   Pt's meals are: -  Breakfast: skips - Lunch: soup, sandwich, burger, diet Mtn dew - Dinner: grilled chicken, veggies, starch, diet sods - Snacks: PB, cookie  - no CKD, last BUN/creatinine:  Lab Results  Component Value Date   BUN 10 07/25/2015   CREATININE 0.32 (L) 07/25/2015   - last set of lipids: Received labs from PCP, drawn on 02/23/2015: - Lipid panel: 138/37/306/40  Lab Results  Component Value Date   CHOL 146 12/15/2013   HDL 33 (A) 12/15/2013   LDLCALC 60 12/15/2013   TRIG 266 (A) 12/15/2013   CHOLHDL 4.2 05/30/2010   - last eye exam was in beginning 2017. 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 - no numbness and tingling in his feet. Has good sensation, but cannot move. He cannot move his legs  ROS: Constitutional: no weight gain/loss, no fatigue, no subjective hyperthermia/hypothermia Eyes: no blurry vision, no xerophthalmia ENT: no sore throat, no nodules palpated in throat, + dysphagia/no odynophagia, no hoarseness Cardiovascular: no CP/SOB/no palpitations/+ leg swelling Respiratory: no cough/SOB Gastrointestinal: n N/V/D/C/ + acid reflux Musculoskeletal: no muscle/joint aches Skin: no rashes Neurological: no tremors/numbness/tingling/dizziness  I reviewed pt's medications, allergies, PMH, social hx, family hx, and changes were documented in the history of present illness. Otherwise, unchanged from my initial visit note.  Past Surgical History:  Procedure Laterality Date  . muscle bisopy     History  Social History  . Marital Status: single    Spouse Name: N/A    Number of Children: 0   Occupational History  . disabled   Social History Main Topics  . Smoking status: Former Games developer  . Smokeless tobacco: Not on file  . Alcohol Use: No  . Drug Use: No   Current Outpatient Prescriptions on File Prior to Visit  Medication Sig Dispense Refill  . BD PEN NEEDLE NANO U/F 32G X 4 MM MISC USE ONE NEEDLE DAILY WITH LANTUS SOLOSTAR AS DIRECTED 100 each 3  . CANASA 1000 MG suppository Place 1,000  mg rectally at bedtime as needed (for ulcerative proctitis).   1  . glipiZIDE (GLUCOTROL XL) 5 MG 24 hr tablet TAKE 2TABLET BY MOUTH EVERY MORNING WITH BREAKFAST 180 tablet 3  . glipiZIDE (GLUCOTROL) 5 MG tablet TAKE 1 TABLET (5 MG TOTAL) BY MOUTH DAILY BEFORE SUPPER. 90 tablet 3  . glucose blood (ONETOUCH VERIO) test strip Use to test blood sugar 2 times daily as instructed. 100 each 11  . HYDROcodone-acetaminophen (HYCET) 7.5-325 mg/15 ml solution Take 10 mLs by mouth at bedtime.    . Insulin Glargine (TOUJEO SOLOSTAR) 300 UNIT/ML SOPN Inject 38 Units into the skin at bedtime. 9 pen 3  . lisinopril (PRINIVIL,ZESTRIL) 10 MG tablet Take 10 mg by mouth daily.  3  . omeprazole (PRILOSEC) 40 MG capsule Take 1 capsule (40 mg total) by mouth daily. 14 capsule 1  . ondansetron (ZOFRAN ODT) 4 MG disintegrating tablet Take 1 tablet (4 mg total) by mouth every 8 (eight) hours as needed for nausea or vomiting. 20 tablet 0  . ONETOUCH DELICA LANCETS 33G MISC Use to test blood sugar 2 times daily as instructed. 100 each 11  . potassium chloride 20 MEQ/15ML (10%) SOLN TAKE 1 TABLESPOONFUL (15 ML) ONCE A DAY ORALLY  6  . rosuvastatin (CRESTOR) 5 MG tablet Take 5 mg by mouth at bedtime.     . TRULICITY 1.5 MG/0.5ML SOPN INJECT 1.5 MG INTO THE SKIN ONCE WEEKLY 6 pen 1   No current facility-administered medications on file prior to visit.     Allergies  Allergen Reactions  . Bactrim Ds [Sulfamethoxazole-Trimethoprim] Other (See Comments)    Pill causes gagging  . Carafate [Sucralfate] Other (See Comments)    Gagging  . Latex Other (See Comments)    Extremely irritated, fire engine red skin  . Penicillins Hives    Has patient had a PCN reaction causing immediate rash, facial/tongue/throat swelling, SOB or lightheadedness with hypotension:unknown Has patient had a PCN reaction causing severe rash involving mucus membranes or skin necrosis:unknown Has patient had a PCN reaction that required  hospitalization:unknown Has patient had a PCN reaction occurring within the last 10 years:infant reaction If all of the above answers are "NO", then may proceed with Cephalosporin use.    Family History  Problem Relation Age of Onset  . Diabetes Mother   . Colon polyps Mother   . Heart disease Father     CAD  . Diabetes Father    PE: BP 132/90 (BP Location: Left Arm, Patient Position: Sitting)   Pulse 95   SpO2 99%  There is no height or weight on file to calculate BMI. 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  Constitutional: overweight, in NAD, in wheelchair. Moves few mm groups, but can  talk and eat (see HPI)  Eyes: PERRLA, EOMI, no exophthalmos ENT: moist mucous membranes, no thyromegaly, no cervical lymphadenopathy Cardiovascular: RRR, No MRG, + pitting LE edema Respiratory: CTA B Gastrointestinal: abdomen soft, NT, ND, BS+ Musculoskeletal: tetraparesis Skin: moist, warm Neurological: tetraparesis  ASSESSMENT: 1. DM2, insulin-dependent, uncontrolled, without long term complications, but with Hyperglycemia  2. L hip pressure rash/sore - resolved  PLAN:  1. Patient with long-standing, uncontrolled diabetes, on oral antidiabetic regimen + Lantus + weekly GLP1 R agonist. At last visit, he was not checking sugars, but HbA1c today was much higher, at 10%, so we increased Toujeo and Glipizide XL in am and added 5 mg before dinner. Since then, he had to increase basal insulin to 93 units as his sugars were still very poorly controlled. We will need to add mealtime insulin. Will also stop Glipizide and decrease Toujeo. - I advised him to: Patient Instructions  Please decrease Toujeo to 80 units at bedtime.  Stop Glipizide.  Continue Trulicity 1.5 mg weekly.  Add NovoLog 10-15 min before meals: - 10 units before a smaller meal - 14 units before a larger  meal  Please return in 1.5 months with your sugar log.  - continue checking sugars at different times of the day - check 3 times a day, rotating checks - given more sugar logs  - advised for yearly eye exams - he is UTD - Return to clinic in 1.5 mo with sugar log   2. L pressure sore - resolved - using a new cream (O'Keefe) >> skin looks much better  Carlus Pavlov, MD PhD [facility]

## 2016-02-09 NOTE — Patient Instructions (Addendum)
Please decrease Toujeo to 80 units at bedtime.  Stop Glipizide.  Continue Trulicity 1.5 mg weekly.  Add NovoLog 10-15 min before meals: - 10 units before a smaller meal - 14 units before a larger meal  Please return in 1.5 months with your sugar log.

## 2016-02-10 ENCOUNTER — Telehealth: Payer: Self-pay | Admitting: Internal Medicine

## 2016-02-10 ENCOUNTER — Other Ambulatory Visit: Payer: Self-pay

## 2016-02-10 MED ORDER — INSULIN LISPRO 100 UNIT/ML (KWIKPEN): PEN_INJECTOR | SUBCUTANEOUS | 3 refills | Status: DC

## 2016-02-10 NOTE — Telephone Encounter (Signed)
OK, Humalog.

## 2016-02-10 NOTE — Telephone Encounter (Signed)
RX sent

## 2016-02-10 NOTE — Telephone Encounter (Signed)
Ins will not cover.   insulin aspart (NOVOLOG FLEXPEN) 100 UNIT/ML FlexPen  [id number] [place] [facility] [place] [place] [facility] [address] [phone] (Phone) [phone] (Fax)

## 2016-02-22 ENCOUNTER — Other Ambulatory Visit: Payer: Self-pay

## 2016-02-25 LAB — LIPID PANEL
CHOLESTEROL: 144 mg/dL (ref 0–200)
Triglycerides: 745 mg/dL — AB (ref 40–160)

## 2016-02-25 LAB — HEMOGLOBIN A1C: Hemoglobin A1C: 8.2

## 2016-02-28 ENCOUNTER — Telehealth: Payer: Self-pay

## 2016-02-28 NOTE — Telephone Encounter (Signed)
Called patient, and spoke with mother about insulin changes. Mother states she will increase and call us back on Friday with how he is doing with the sugars.

## 2016-02-28 NOTE — Telephone Encounter (Signed)
Per my records, he is on: - Toujeo to 80 units at bedtime. - Humalog 10-15 min before meals: - 10 units before a smaller meal - 14 units before a larger meal  He can increase Humalog to  - 14 units before a smaller meal - 18 units before a larger meal  He can continue to increase the doses as needed, if the sugars stay this high. Does he know why they are so high? Does he have any infections?

## 2016-02-28 NOTE — Telephone Encounter (Signed)
BS are still running in the 200s to 300s

## 2016-03-27 ENCOUNTER — Ambulatory Visit: Payer: Medicare Other | Admitting: Internal Medicine

## 2016-04-24 ENCOUNTER — Other Ambulatory Visit: Payer: Self-pay | Admitting: Internal Medicine

## 2016-05-17 ENCOUNTER — Ambulatory Visit (INDEPENDENT_AMBULATORY_CARE_PROVIDER_SITE_OTHER): Payer: Medicare Other | Admitting: Internal Medicine

## 2016-05-17 ENCOUNTER — Encounter: Payer: Self-pay | Admitting: Internal Medicine

## 2016-05-17 VITALS — BP 130/80 | HR 90 | Ht 68.0 in | Wt 150.0 lb

## 2016-05-17 DIAGNOSIS — Z794 Long term (current) use of insulin: Secondary | ICD-10-CM | POA: Diagnosis not present

## 2016-05-17 DIAGNOSIS — E1165 Type 2 diabetes mellitus with hyperglycemia: Secondary | ICD-10-CM | POA: Diagnosis not present

## 2016-05-17 LAB — POCT GLYCOSYLATED HEMOGLOBIN (HGB A1C): HEMOGLOBIN A1C: 8

## 2016-05-17 NOTE — Progress Notes (Signed)
c 

## 2016-05-17 NOTE — Addendum Note (Signed)
Addended by: Clearance Coots D on: 05/17/2016 11:18 AM   Modules accepted: Orders

## 2016-05-17 NOTE — Patient Instructions (Signed)
Please continue: - Toujeo 80 units at bedtime - Trulicity 1.5 mg but switch it to am  Increase Humalog to: - 18 units with a smaller meal - 22-24 units with a larger meal  Please send me sugar values in 2 weeks.  Please come back for a follow-up appointment in 3 months.

## 2016-05-17 NOTE — Progress Notes (Signed)
Patient ID: [name], male   DOB: [birth year], 51 y.o.   MRN: [id number]  HPI: [name] is a 50 y.o.-year-old male, returning to f/u for DM2, dx in 2003, insulin-dependent, uncontrolled, without complications. Last visit 1.5 mo ago. He has [employer].  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 In the last 2 years, his swallowing is progressively more impaired. Mother is primary caregiver. She recently had an AMI so she could not check his sugars since 03/2016, however, he did receive his meds.  Hemoglobin A1c levels reviewed per records from PCP and per Epic records: Lab Results  Component Value Date   HGBA1C 8.2 02/25/2016   HGBA1C 10.0 12/29/2015   HGBA1C 6.9 12/28/2014  06/21/2015: HbA1c 8.2% 02/23/2015: HbA1C: 6.3% 12/15/2013: HbA1c 8.9% 06/12/2013: HbA1c 7.8% 12/13/2012: HbA1c 7.5% 12/07/2011: HbA1c 6.9% 05/30/2010: HbA1c 8.3%  Pt is on a regimen of: - Toujeo 38 >> 93 units >> 80 units - Humalog 14-18 units 10 min before meals - Trulicity 1.5 mg weekly. He tried Metformin >> diarrhea. We tried again Metformin >> diarrhea >> now stopped.  He was on Onglyza >> now stopped.  Pt's mom was checking his sugars 1-2 a day >> last checks were in 03/2016 2/2 mother's health pbs: - am:112, 121 >> 117, 177, 182 >> 138-177 >> n/c >> 145-222, 261 >> 168-195, 218 - 2h after b'fast: 146-183 >> 123-197 >> 170-203 >> n/c - before lunch: 126, 129 >> 111-158 >> 136-173, 203 >> 192, 294 - 2h after lunch: 125, 230 >> 119, 221 >> 249x1 >> 164, 206, 363 >> 192, 243-329 - before dinner:  121, 203 >> 119-129 >> 112, 167 >> 165-256 >> 175-295 - after dinner: 222-278 >> 204 >> n/c >> 112-137 >> 126, 138, 214-243 >> 229, 252 >> 209, 223 >> 240-268 - bedtime: 197 >> n/c >> 96, 138, 182, 233 >> n/c  No lows; he has hypoglycemia awareness at 70.   Pt's meals are: - Breakfast: skips - Lunch: soup, sandwich, burger, diet Mtn dew - Dinner:  grilled chicken, veggies, starch, diet sods - Snacks: PB, cookie  - no CKD, last BUN/creatinine:  Lab Results  Component Value Date   BUN 10 07/25/2015   CREATININE 0.32 (L) 07/25/2015   - last set of lipids: Received labs from PCP, drawn on 02/23/2015: - Lipid panel: 138/37/306/40  Lab Results  Component Value Date   CHOL 144 02/25/2016   HDL 33 (A) 12/15/2013   LDLCALC 60 12/15/2013   TRIG 745 (A) 02/25/2016   CHOLHDL 4.2 05/30/2010   - last eye exam was in beginning 2017. 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 - no numbness and tingling in his feet. Has good sensation, but cannot move. He cannot move his legs  ROS: Constitutional: no weight gain/loss, no fatigue, no subjective hyperthermia/hypothermia, + nocturia Eyes: no blurry vision, no xerophthalmia ENT: no sore throat, no nodules palpated in throat, + dysphagia/no odynophagia, no hoarseness Cardiovascular: no CP/SOB/no palpitations/+ leg swelling Respiratory: + cough/no SOB Gastrointestinal: n N/V/D/C/ + acid reflux Musculoskeletal: no muscle/joint aches Skin: no rashes Neurological: no tremors/numbness/tingling/dizziness  I reviewed pt's medications, allergies, PMH, social hx, family hx, and changes were documented in the history of present illness. Otherwise, unchanged from my initial visit note.  Past Surgical History:  Procedure Laterality Date  . muscle bisopy     History   Social History  . Marital Status: single  Spouse Name: N/A    Number of Children: 0   Occupational History  . disabled   Social History Main Topics  . Smoking status: Former Games developer  . Smokeless tobacco: Not on file  . Alcohol Use: No  . Drug Use: No   Current Outpatient Prescriptions on File Prior to Visit  Medication Sig Dispense Refill  . BD PEN NEEDLE NANO U/F 32G X 4 MM MISC USE ONE NEEDLE DAILY WITH LANTUS SOLOSTAR AS DIRECTED 100 each 3  . CANASA 1000 MG suppository Place 1,000 mg rectally at bedtime as needed (for ulcerative proctitis).    1  . glucose blood (ONETOUCH VERIO) test strip Use to test blood sugar 3 times daily as instructed. 300 each 3  . HYDROcodone-acetaminophen (HYCET) 7.5-325 mg/15 ml solution Take 10 mLs by mouth at bedtime.    . insulin aspart (NOVOLOG FLEXPEN) 100 UNIT/ML FlexPen Inject 10-14 Units into the skin 3 (three) times daily with meals. 15 mL 5  . Insulin Glargine (TOUJEO SOLOSTAR) 300 UNIT/ML SOPN Inject 80 Units into the skin at bedtime. 9 pen 3  . insulin lispro (HUMALOG KWIKPEN) 100 UNIT/ML KiwkPen Inject 10-14 units into the skin 3 times daily with meals 15 mL 3  . lisinopril (PRINIVIL,ZESTRIL) 10 MG tablet Take 10 mg by mouth daily.  3  . omeprazole (PRILOSEC) 40 MG capsule Take 1 capsule (40 mg total) by mouth daily. 14 capsule 1  . ondansetron (ZOFRAN ODT) 4 MG disintegrating tablet Take 1 tablet (4 mg total) by mouth every 8 (eight) hours as needed for nausea or vomiting. 20 tablet 0  . ONETOUCH DELICA LANCETS 33G MISC Use to test blood sugar 2 times daily as instructed. 100 each 11  . potassium chloride 20 MEQ/15ML (10%) SOLN TAKE 1 TABLESPOONFUL (15 ML) ONCE A DAY ORALLY  6  . rosuvastatin (CRESTOR) 5 MG tablet Take 5 mg by mouth at bedtime.     . TRULICITY 1.5 MG/0.5ML SOPN INJECT 1.5 MG INTO THE SKIN ONCE WEEKLY 6 pen 1   No current facility-administered medications on file prior to visit.     Allergies  Allergen Reactions  . Bactrim Ds [Sulfamethoxazole-Trimethoprim] Other (See Comments)    Pill causes gagging  . Carafate [Sucralfate] Other (See Comments)    Gagging  . Latex Other (See Comments)    Extremely irritated, fire engine red skin  . Penicillins Hives    Has patient had a PCN reaction causing immediate rash, facial/tongue/throat swelling, SOB or lightheadedness with hypotension:unknown Has patient had a PCN reaction causing severe rash involving mucus membranes or skin necrosis:unknown Has patient had a PCN reaction that required hospitalization:unknown Has patient had a  PCN reaction occurring within the last 10 years:infant reaction If all of the above answers are "NO", then may proceed with Cephalosporin use.    Family History  Problem Relation Age of Onset  . Diabetes Mother   . Colon polyps Mother   . Heart disease Father     CAD  . Diabetes Father    PE: BP 130/80 (BP Location: Right Arm, Patient Position: Sitting, Cuff Size: Normal)   Pulse 90   Ht  (1.727 m)   Wt 150 lb (68 kg)   BMI 22.81 kg/m  Body mass index is 22.81 kg/m. Wt Readings from Last 3 Encounters:  05/17/16 150 lb (68 kg)  07/25/15 165 lb (74.8 kg)  02/11/14 165 lb (74.8 kg)  Pt could not be weighed as he is tetraplegic, in a  wheelchair.  Constitutional: overweight, in NAD, in wheelchair. Moves few mm groups, but can talk and eat (see HPI)  Eyes: PERRLA, EOMI, no exophthalmos ENT: moist mucous membranes, no thyromegaly, no cervical lymphadenopathy Cardiovascular: RRR, No MRG, + pitting LE edema. Also L hand> R hand  swelling Respiratory: CTA B Gastrointestinal: abdomen soft, NT, ND, BS+ Musculoskeletal: tetraparesis Skin: moist, warm Neurological: tetraparesis  ASSESSMENT: 1. DM2, insulin-dependent, uncontrolled, without long term complications, but with Hyperglycemia  PLAN:  1. Patient with long-standing, uncontrolled diabetes, on oral antidiabetic regimen + weekly GLP1 R agonist + Toujeo + now Humalog. At this visit, he was not checking sugars, but HbA1c today is a little better, at 8%. I advised them to start checking sugars before meals >> will increase mealtime insulin a little but will make more adjustment after they send me his log in 2 weeks. - I advised him to: Patient Instructions  Please continue: - Toujeo 80 units at bedtime - Trulicity 1.5 mg but switch it to am  Increase Humalog to: - 18 units with a smaller meal - 22-24 units with a larger meal  Please send me sugar values in 2 weeks.  Please come back for a follow-up appointment in 3  months.  - restartchecking sugars at different times of the day - check 3 times a day, rotating checks - given more sugar logs  - advised for yearly eye exams - he is UTD - Return to clinic in 3 mo with sugar log   Carlus Pavlov, MD PhD [facility]

## 2016-06-01 ENCOUNTER — Other Ambulatory Visit: Payer: Self-pay

## 2016-06-01 MED ORDER — INSULIN LISPRO 100 UNIT/ML (KWIKPEN): PEN_INJECTOR | SUBCUTANEOUS | 3 refills | Status: DC

## 2016-06-08 ENCOUNTER — Other Ambulatory Visit: Payer: Self-pay

## 2016-06-08 MED ORDER — INSULIN LISPRO 100 UNIT/ML (KWIKPEN): PEN_INJECTOR | SUBCUTANEOUS | 3 refills | Status: DC

## 2016-08-18 ENCOUNTER — Ambulatory Visit: Payer: Self-pay | Admitting: Podiatry

## 2016-08-29 ENCOUNTER — Ambulatory Visit: Payer: Medicare Other | Admitting: Internal Medicine

## 2016-08-29 ENCOUNTER — Telehealth: Payer: Self-pay | Admitting: Internal Medicine

## 2016-08-29 DIAGNOSIS — Z0289 Encounter for other administrative examinations: Secondary | ICD-10-CM

## 2016-08-29 NOTE — Telephone Encounter (Signed)
3mo

## 2016-08-29 NOTE — Telephone Encounter (Signed)
Patient no showed today's appt. Please advise on how to follow up. °A. No follow up necessary. °B. Follow up urgent. Contact patient immediately. °C. Follow up necessary. Contact patient and schedule visit in ___ days. °D. Follow up advised. Contact patient and schedule visit in ____weeks. ° °

## 2016-09-06 ENCOUNTER — Ambulatory Visit (INDEPENDENT_AMBULATORY_CARE_PROVIDER_SITE_OTHER): Payer: Medicare Other | Admitting: Internal Medicine

## 2016-09-06 ENCOUNTER — Encounter: Payer: Self-pay | Admitting: Internal Medicine

## 2016-09-06 VITALS — BP 124/88 | HR 87

## 2016-09-06 DIAGNOSIS — Z794 Long term (current) use of insulin: Secondary | ICD-10-CM

## 2016-09-06 DIAGNOSIS — E1165 Type 2 diabetes mellitus with hyperglycemia: Secondary | ICD-10-CM

## 2016-09-06 DIAGNOSIS — E785 Hyperlipidemia, unspecified: Secondary | ICD-10-CM | POA: Diagnosis not present

## 2016-09-06 DIAGNOSIS — R1310 Dysphagia, unspecified: Secondary | ICD-10-CM

## 2016-09-06 LAB — LIPID PANEL
CHOL/HDL RATIO: 6
CHOLESTEROL: 174 mg/dL (ref 0–200)
HDL: 28.9 mg/dL — ABNORMAL LOW (ref >39.00)
Triglycerides: 537 mg/dL — ABNORMAL HIGH (ref 0.0–149.0)

## 2016-09-06 LAB — POCT GLYCOSYLATED HEMOGLOBIN (HGB A1C): Hemoglobin A1C: 8.3

## 2016-09-06 LAB — LDL CHOLESTEROL, DIRECT: Direct LDL: 95 mg/dL

## 2016-09-06 MED ORDER — GLUCOSE BLOOD VI STRP: ORAL_STRIP | 11 refills | Status: AC

## 2016-09-06 NOTE — Patient Instructions (Addendum)
Please continue: - Toujeo 80 units at bedtime - Trulicity 1.5 mg in am  Change: - Humalog: - 18 units with a smaller meal - 20 units with a regular meal  - 22 units with a larger meal  Please start checking sugars 3x a day.  Please come back for a follow-up appointment in 3 months.

## 2016-09-06 NOTE — Progress Notes (Signed)
Patient ID: [name], male   DOB: July 12, 1965, 51 y.o.   MRN: [id number]  HPI: [name] Robertaul A Bonus is a 51 y.o.-year-old male, returning to f/u for DM2, dx in 2003, insulin-dependent, uncontrolled, without complications. Last visit 3.5 mo ago. She is here with his mother who offers most of the history, as patient has difficulty talking. He has [employer] insurance.  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 In the last 2 years, his swallowing is progressively more impaired. Mother is primary caregiver. She is checking his sugars and gives him the medicines. His mother was again sick since last visit >> he did not have sugars checks and insulin doses during that period.  Since last visit, he has more sour taste in his mouth and nausea/vomiting. He has a h/o GB stones.   He has more pbs swallowing >> [place] (reviewed note) >> will have a modified Ba swalowing study  Hemoglobin A1c levels: Lab Results  Component Value Date   HGBA1C 8.0 05/17/2016   HGBA1C 8.2 02/25/2016   HGBA1C 10.0 12/29/2015  06/21/2015: HbA1c 8.2% 02/23/2015: HbA1C: 6.3% 12/15/2013: HbA1c 8.9% 06/12/2013: HbA1c 7.8% 12/13/2012: HbA1c 7.5% 12/07/2011: HbA1c 6.9% 05/30/2010: HbA1c 8.3%  Pt is on a regimen of: - Toujeo 80 units at bedtime - Trulicity 1.5 mg in am - Humalog - missed this in last few weeks as mother was sick - 18 units with a smaller meal - 22-24 units with a larger meal He tried Metformin >> diarrhea. We tried again Metformin >> diarrhea >> now stopped.  He was on Onglyza >> now stopped.  Pt's mom was checking his sugars 0-2x a day >> stopped few weeks ago. From last visit: - am: 145-222, 261 >> 168-195, 218  - 2h after b'fast: 123-197 >> 170-203 >> n/c - before lunch: 136-173, 203 >> 192, 294 - 2h after lunch: 164, 206, 363 >> 192, 243-329 - before dinner: 165-256 >> 175-295 - after dinner: 229, 252 >> 209, 223 >>  240-268 - bedtime: 197 >> n/c >> 96, 138, 182, 233 >> n/c  No lows; he has hypoglycemia awareness at 70.   Pt's meals are: - Breakfast: skips - Lunch: soup, sandwich, burger, diet Mtn dew - Dinner: grilled chicken, veggies, starch, diet sods - Snacks: PB, cookie  - No CKD, last BUN/creatinine:  Lab Results  Component Value Date   BUN 10 07/25/2015   CREATININE 0.32 (L) 07/25/2015   - last set of lipids: Lab Results  Component Value Date   CHOL 144 02/25/2016   HDL 33 (A) 12/15/2013   LDLCALC 60 12/15/2013   TRIG 745 (A) 02/25/2016   CHOLHDL 4.2 05/30/2010  02/23/2015: 138/37/306/40  - last eye exam was in 2017 >> 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 - He denies numbness and tingling in his feet. Has good sensation, but cannot move.   ROS: Constitutional: no weight gain/no weight loss, no fatigue, no subjective hyperthermia, + subjective hypothermia Eyes: no blurry vision, no xerophthalmia ENT: + sore throat, no nodules palpated in throat, + dysphagia, no odynophagia, + hoarseness Cardiovascular: no CP/no SOB/no palpitations/+ leg swelling Respiratory: + cough/no SOB/+ wheezing Gastrointestinal: + N/+ V/no D/no C/+ acid reflux Musculoskeletal: no muscle aches/no joint aches Skin: no rashes, no hair loss Neurological: no tremors/no numbness/no tingling/no dizziness  I reviewed pt's medications, allergies, PMH, social hx, family hx, and changes were documented in the history of  present illness. Otherwise, unchanged from my initial visit note.  Past Surgical History:  Procedure Laterality Date  . muscle bisopy     History   Social History  . Marital Status: single    Spouse Name: N/A    Number of Children: 0   Occupational History  . disabled   Social History Main Topics  . Smoking status: Former Games developer  . Smokeless tobacco: Not on file  . Alcohol Use: No  . Drug Use: No   Current Outpatient Prescriptions on File Prior to Visit  Medication Sig Dispense Refill  . BD PEN NEEDLE  NANO U/F 32G X 4 MM MISC USE ONE NEEDLE DAILY WITH LANTUS SOLOSTAR AS DIRECTED 100 each 3  . CANASA 1000 MG suppository Place 1,000 mg rectally at bedtime as needed (for ulcerative proctitis).   1  . glucose blood (ONETOUCH VERIO) test strip Use to test blood sugar 3 times daily as instructed. 300 each 3  . HYDROcodone-acetaminophen (HYCET) 7.5-325 mg/15 ml solution Take 10 mLs by mouth at bedtime.    . Insulin Glargine (TOUJEO SOLOSTAR) 300 UNIT/ML SOPN Inject 80 Units into the skin at bedtime. 9 pen 3  . insulin lispro (HUMALOG KWIKPEN) 100 UNIT/ML KiwkPen Inject 10-14 units into the skin 3 times daily with meals 15 mL 3  . lisinopril (PRINIVIL,ZESTRIL) 10 MG tablet Take 10 mg by mouth daily.  3  . omeprazole (PRILOSEC) 40 MG capsule Take 1 capsule (40 mg total) by mouth daily. 14 capsule 1  . ondansetron (ZOFRAN ODT) 4 MG disintegrating tablet Take 1 tablet (4 mg total) by mouth every 8 (eight) hours as needed for nausea or vomiting. 20 tablet 0  . ONETOUCH DELICA LANCETS 33G MISC Use to test blood sugar 2 times daily as instructed. 100 each 11  . potassium chloride 20 MEQ/15ML (10%) SOLN TAKE 1 TABLESPOONFUL (15 ML) ONCE A DAY ORALLY  6  . rosuvastatin (CRESTOR) 5 MG tablet Take 5 mg by mouth at bedtime.     . TRULICITY 1.5 MG/0.5ML SOPN INJECT 1.5 MG INTO THE SKIN ONCE WEEKLY 6 pen 1   No current facility-administered medications on file prior to visit.     Allergies  Allergen Reactions  . Bactrim Ds [Sulfamethoxazole-Trimethoprim] Other (See Comments)    Pill causes gagging  . Carafate [Sucralfate] Other (See Comments)    Gagging  . Latex Other (See Comments)    Extremely irritated, fire engine red skin  . Penicillins Hives    Has patient had a PCN reaction causing immediate rash, facial/tongue/throat swelling, SOB or lightheadedness with hypotension:unknown Has patient had a PCN reaction causing severe rash involving mucus membranes or skin necrosis:unknown Has patient had a PCN  reaction that required hospitalization:unknown Has patient had a PCN reaction occurring within the last 10 years:infant reaction If all of the above answers are "NO", then may proceed with Cephalosporin use.    Family History  Problem Relation Age of Onset  . Diabetes Mother   . Colon polyps Mother   . Heart disease Father        CAD  . Diabetes Father    PE: BP 124/88 (BP Location: Left Arm, Patient Position: Sitting)   Pulse 87   SpO2 97%  There is no height or weight on file to calculate BMI. 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  Constitutional: overweight, in NAD, in wheelchair. Moves few muscle groups, but can talk and eat  Eyes: PERRLA, EOMI, no exophthalmos ENT: moist mucous membranes, no thyromegaly, no cervical lymphadenopathy Cardiovascular: RRR, No MRG, + pitting lower extremity edema. Also + L>R hand swelling Respiratory: CTA B Gastrointestinal: abdomen soft, NT, ND, BS+ Musculoskeletal:Tetraparesis  Skin: moist, warm, no rashes Neurological:Tetraparesis   ASSESSMENT: 1. DM2, insulin-dependent, uncontrolled, without long term complications, But with hyperglycemia  2. Dysphagia   3. HL  PLAN:  1. Patient with long-standing, uncontrolled diabetes, on oral antidiabetic regimen + weekly GLP1 R agonist + Toujeo + Humalog.At last visit, as his sugars were higher, I advised him to increase the amount of Humalog with meals. They are only using one dose, for a smaller meal and, as his mother was sick, he did not have Humalog in the last 3 weeks. Subsequently, his diabetes is worse - HbA1c is 8.3% (higher). - Unfortunately, he does not have any sugar checks so I cannot change his regimen significantly at this visit. I did advise them to increase the Humalog with meals, but he needs to take it before each meal. - I advised him  to: Patient Instructions  Please continue: - Toujeo 80 units at bedtime - Trulicity 1.5 mg in am  Change: - Humalog: - 18 units with a smaller meal - 20 units with a regular meal  - 22 units with a larger meal  Please start checking sugars 3x a day.  Please come back for a follow-up appointment in 3 months.  - start checking sugars at different times of the day - check 3x a day, rotating checks - advised for yearly eye exams >> he is UTD -  will check a CMP and a lipid panel today  - Return to clinic in 3 mo with sugar log  2. Dysphagia   - Reviewed records from the speech pathologist. He has increasing dysphagia and will have a barium swallow test soon. Unfortunately, this will impair his capacity to eat in the future, and may definitely impact his diabetes control.  - His mother gave him a PPI, and this helped some   3. HL - TG worse at last check - On Crestor - recheck today (nonfasting)  Office Visit on 09/06/2016  Component Date Value Ref Range Status  . Cholesterol 09/06/2016 174  0 - 200 mg/dL Final   ATP III Classification       Desirable:  < 200 mg/dL               Borderline High:  200 - 239 mg/dL          High:  > = 161 mg/dL  . Triglycerides 09/06/2016 537.0 Triglyceride is over 400; calculations on Lipids are invalid.* 0.0 - 149.0 mg/dL Final   Normal:  <096 mg/dLBorderline High:  150 - 199 mg/dL  . HDL 09/06/2016 28.90* >39.00 mg/dL Final  . Total CHOL/HDL Ratio 09/06/2016 6   Final                  Men          Women1/2 Average Risk     3.4          3.3Average Risk          5.0          4.42X Average Risk          9.6  7.13X Average Risk          15.0          11.0                      . Sodium 09/06/2016 136  135 - 146 mmol/L Final  . Potassium 09/06/2016 4.2  3.5 - 5.3 mmol/L Final  . Chloride 09/06/2016 100  98 - 110 mmol/L Final  . CO2 09/06/2016 17* 20 - 32 mmol/L Final   Comment: ** Please note change in reference range(s). **     . Glucose,  Bld 09/06/2016 189* 65 - 99 mg/dL Final  . BUN 16/10/9602 10  7 - 25 mg/dL Final  . Creat 54/09/8117 0.21* 0.70 - 1.33 mg/dL Final   Comment:   For patients > or = 50 years of age: The upper reference limit for Creatinine is approximately 13% higher for people identified as African-American.     . Total Bilirubin 09/06/2016 0.4  0.2 - 1.2 mg/dL Final  . Alkaline Phosphatase 09/06/2016 61  40 - 115 U/L Final  . AST 09/06/2016 33  10 - 35 U/L Final  . ALT 09/06/2016 34  9 - 46 U/L Final  . Total Protein 09/06/2016 6.6  6.1 - 8.1 g/dL Final  . Albumin 14/78/2956 3.9  3.6 - 5.1 g/dL Final  . Calcium 21/30/8657 8.9  8.6 - 10.3 mg/dL Final  . GFR, Est African American 09/06/2016 >89  >=60 mL/min Final  . GFR, Est Non African American 09/06/2016 >89  >=60 mL/min Final  . Hemoglobin A1C 09/06/2016 8.3   Final  . Direct LDL 09/06/2016 95.0  mg/dL Final   Optimal:  <846 mg/dLNear or Above Optimal:  100-129 mg/dLBorderline High:  130-159 mg/dLHigh:  160-189 mg/dLVery High:  >190 mg/dL   TG lower, but still high >> would suggest to start Fenofibrate.  Carlus Pavlov, MD PhD [facility]

## 2016-09-07 ENCOUNTER — Telehealth: Payer: Self-pay

## 2016-09-07 LAB — COMPLETE METABOLIC PANEL WITH GFR
ALT: 34 U/L (ref 9–46)
AST: 33 U/L (ref 10–35)
Albumin: 3.9 g/dL (ref 3.6–5.1)
Alkaline Phosphatase: 61 U/L (ref 40–115)
BUN: 10 mg/dL (ref 7–25)
CHLORIDE: 100 mmol/L (ref 98–110)
CO2: 17 mmol/L — AB (ref 20–32)
Calcium: 8.9 mg/dL (ref 8.6–10.3)
Creat: 0.21 mg/dL — ABNORMAL LOW (ref 0.70–1.33)
GLUCOSE: 189 mg/dL — AB (ref 65–99)
Potassium: 4.2 mmol/L (ref 3.5–5.3)
SODIUM: 136 mmol/L (ref 135–146)
Total Bilirubin: 0.4 mg/dL (ref 0.2–1.2)
Total Protein: 6.6 g/dL (ref 6.1–8.1)

## 2016-09-07 NOTE — Telephone Encounter (Signed)
LVM, gave lab results. Gave call back number if any questions or concerns.  

## 2016-09-07 NOTE — Telephone Encounter (Signed)
Patient's wife called in reference to new Rx for patient(patient's wife did not recall the name of the Rx). Patient is having trouble swallowing and was curious if the medication was a liquid or small pill. Patient's wife stated if it was a liquid or small pill it could go ahead and be sent to pharmacy but if it is a larger pill please call patient and advise.

## 2016-09-07 NOTE — Telephone Encounter (Signed)
-----   Message from Carlus Pavlovristina Gherghe, MD sent at 09/07/2016 12:35 PM EDT ----- Raynelle FanningJulie, can you please call pt:  TG lower, but still high >> would suggest to start Fenofibrate. Would he agree?

## 2016-09-12 ENCOUNTER — Ambulatory Visit: Payer: Medicare Other | Admitting: Internal Medicine

## 2016-10-02 ENCOUNTER — Other Ambulatory Visit: Payer: Self-pay | Admitting: Internal Medicine

## 2016-10-27 ENCOUNTER — Other Ambulatory Visit: Payer: Self-pay

## 2016-10-27 ENCOUNTER — Telehealth: Payer: Self-pay | Admitting: Internal Medicine

## 2016-10-27 MED ORDER — FREESTYLE LIBRE READER DEVI: 1.0000 | Freq: Every day | 0 refills | Status: DC

## 2016-10-27 MED ORDER — FREESTYLE LIBRE SENSOR SYSTEM MISC: 3 refills | Status: DC

## 2016-10-27 NOTE — Telephone Encounter (Signed)
Please advise 

## 2016-10-27 NOTE — Telephone Encounter (Signed)
Patient's mother called to see if he can get the new freestyle libre system. Please send the rx to the pharmacy if possible otherwise, call patient to notify.  [id number] [place] [address], [place] - [address] [phone] (Phone) [phone] (Fax)

## 2016-10-27 NOTE — Telephone Encounter (Signed)
Submitted the [employer] into the pharmacy to see if he could get it.

## 2016-10-28 ENCOUNTER — Other Ambulatory Visit: Payer: Self-pay | Admitting: Internal Medicine

## 2016-10-29 ENCOUNTER — Other Ambulatory Visit: Payer: Self-pay | Admitting: Internal Medicine

## 2016-10-30 ENCOUNTER — Other Ambulatory Visit: Payer: Self-pay

## 2016-10-30 MED ORDER — FREESTYLE LIBRE SENSOR SYSTEM MISC: 3 refills | Status: DC

## 2016-10-31 ENCOUNTER — Telehealth: Payer: Self-pay | Admitting: *Deleted

## 2016-10-31 NOTE — Telephone Encounter (Signed)
Patient mother Harriett Sine called and states that Dr. Elvera Lennox sent in a RX for the [facility] reader and the sensor to the patient pharmacy. The patient insurance is not going to cover the reader or the sensor. They are requesting a prior auth for both. Please advise. Thank you

## 2016-10-31 NOTE — Telephone Encounter (Signed)
Called to advise that patient will have to use the same meter as before, insurance will not approve even with the PA.

## 2016-10-31 NOTE — Telephone Encounter (Signed)
No answer, and no voicemail to leave message.

## 2016-10-31 NOTE — Telephone Encounter (Signed)
Patient mother Harriett Sine is returning a missed phone call. Patient mother states you call her back. Patient mother states to call back after 12:30 if possible. Thank you

## 2016-11-01 NOTE — Telephone Encounter (Signed)
Called and advised patients mother, that the freestyle would not be covered, and she understood to stick with what was covered. No other questions.

## 2016-11-03 NOTE — Telephone Encounter (Signed)
Patient's wife called the [employer] and discussed further. She was notified that [facility] would fax over a rx to Dr. Elvera Lennox so that the patient can get the diabetic supplies. Endo office on stand by for the fax.

## 2016-11-06 NOTE — Telephone Encounter (Signed)
Noted, will be on the look out.   

## 2016-11-09 ENCOUNTER — Other Ambulatory Visit: Payer: Self-pay | Admitting: Internal Medicine

## 2016-11-26 IMAGING — US US ABDOMEN COMPLETE
1 series · 13 of 25 positions shown · non-contrast
Comparison: None.

CLINICAL DATA: Abdominal and flank pain.

EXAM:
ABDOMEN ULTRASOUND COMPLETE

[Series 1: us abdomen complete · 0.28mm/px · 13 of 69 slices shown]
[im 1/69]
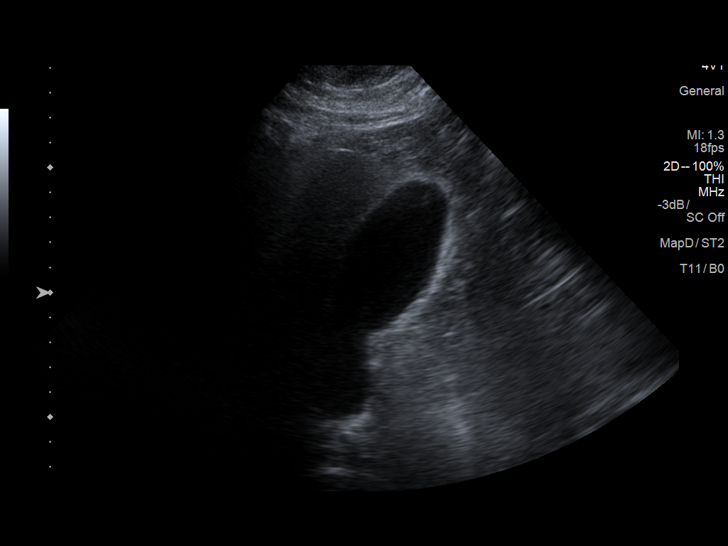
[im 6/69]
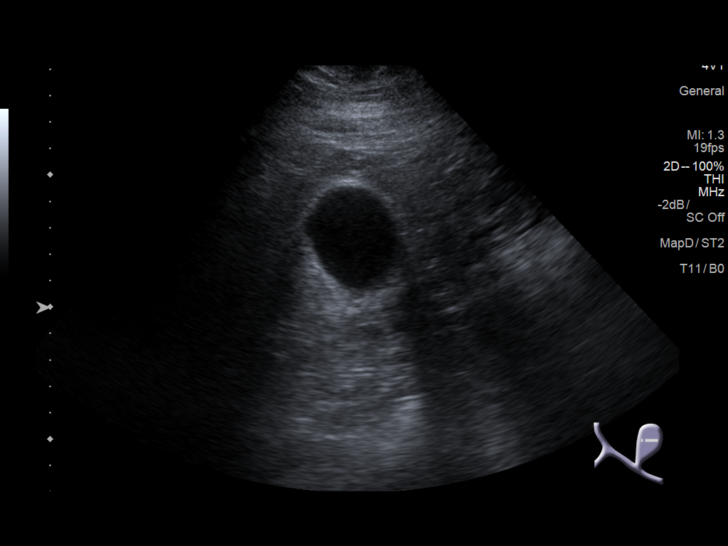
[im 12/69]
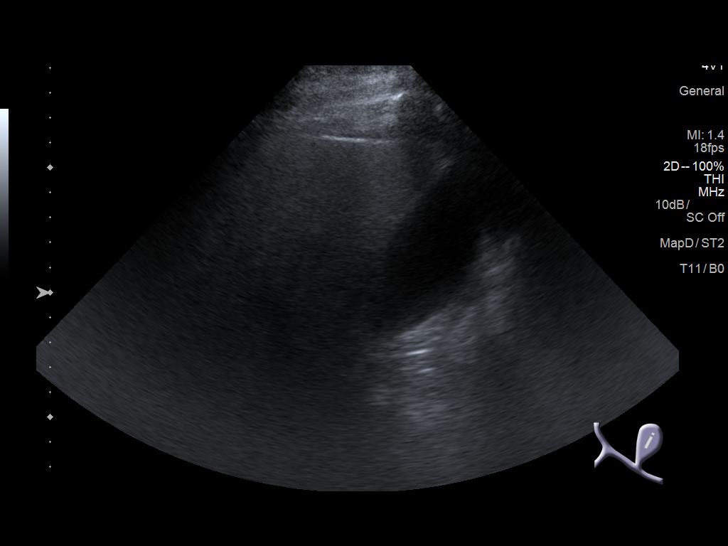
[im 18/69]
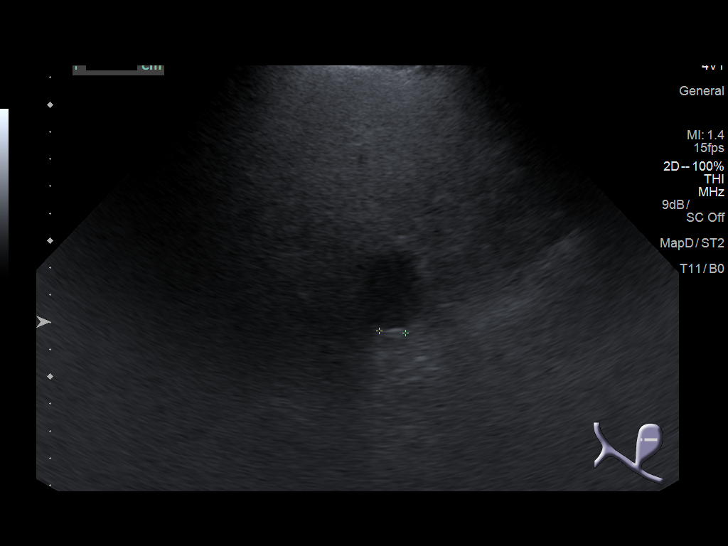
[im 23/69]
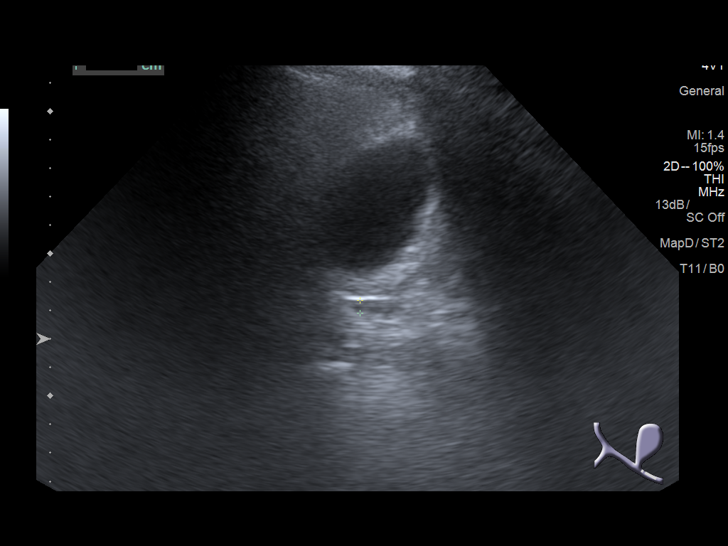
[im 29/69]
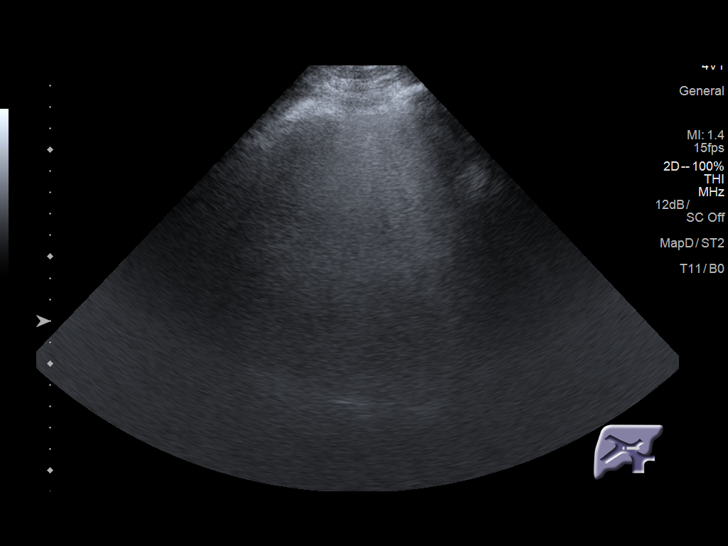
[im 35/69]
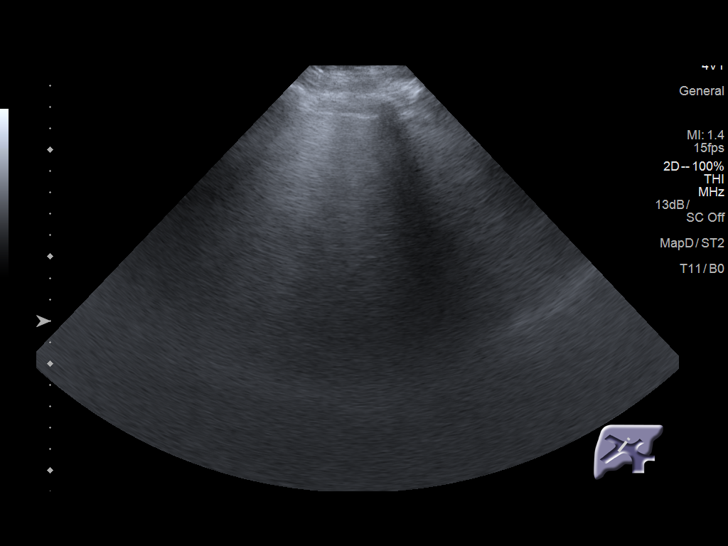
[im 40/69]
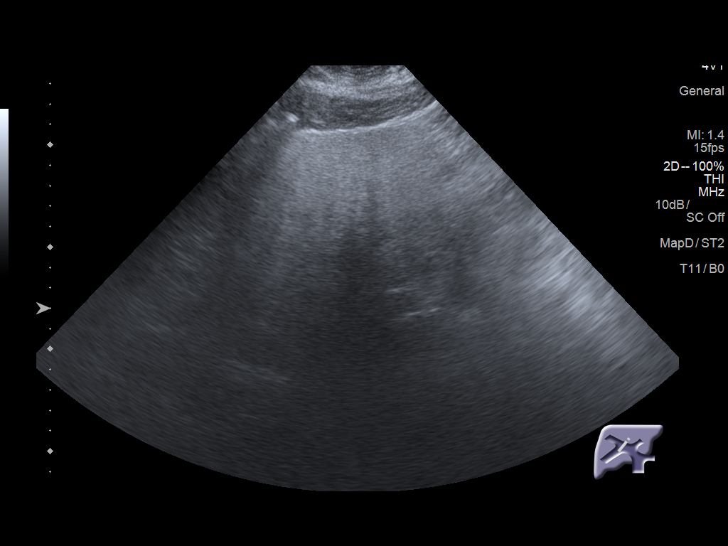
[im 46/69]
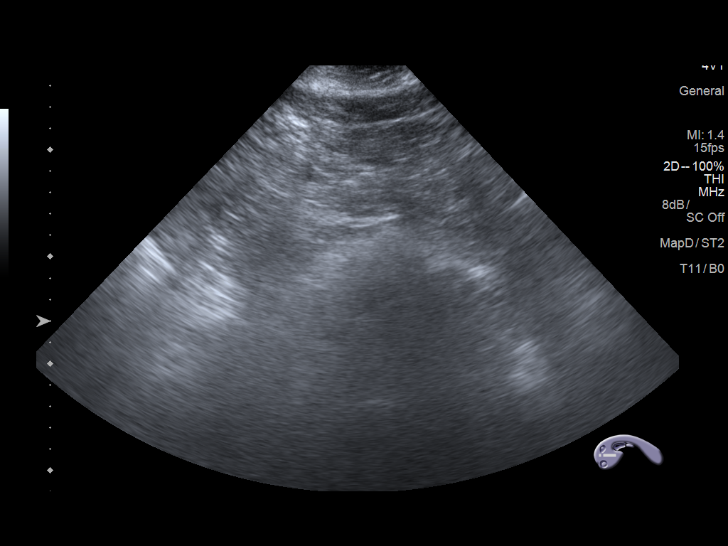
[im 52/69]
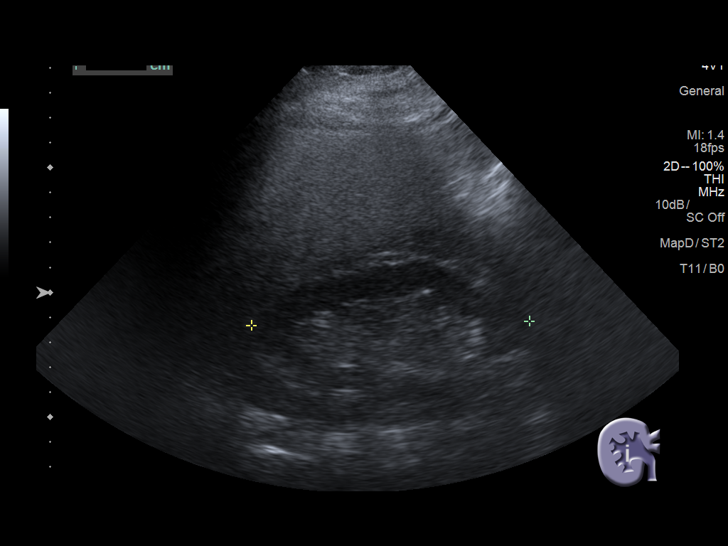
[im 57/69]
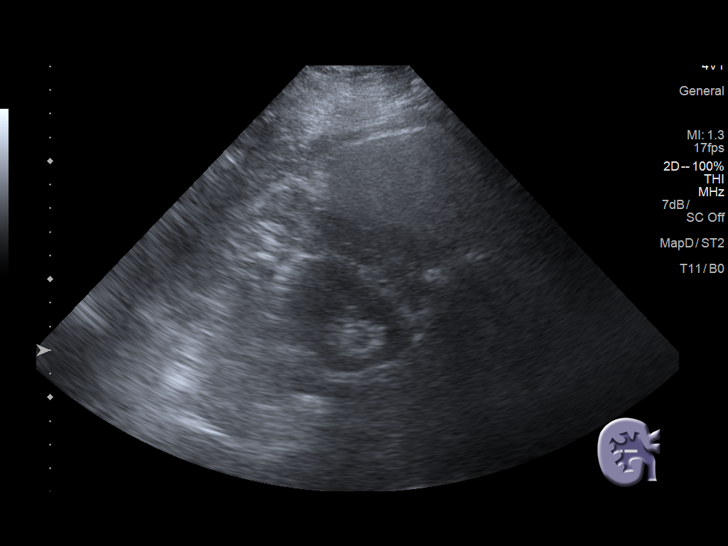
[im 63/69]
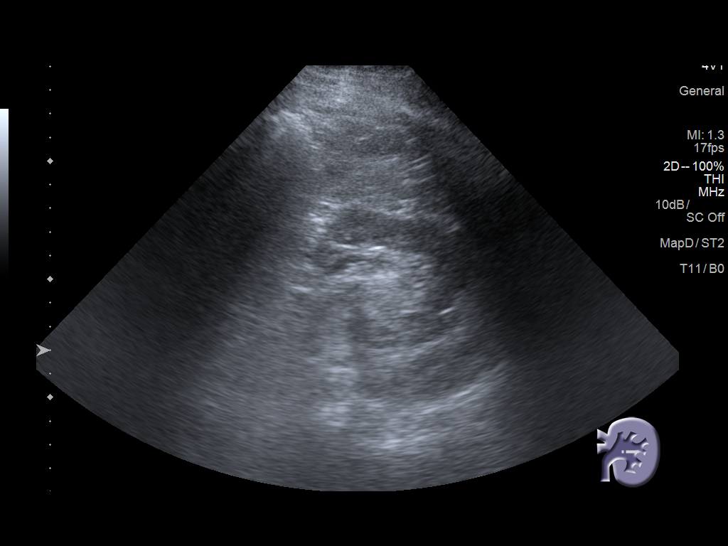
[im 69/69]
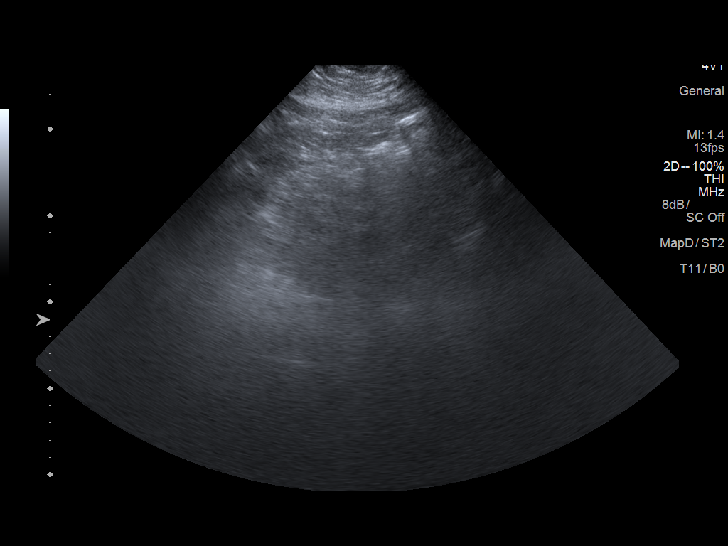

[13 of 25 positions shown; findings below may reference images not displayed]

FINDINGS: Study is limited by body habitus and inability to properly position
the patient for the exam. Some structures are obscured by overlying
bowel gas.

Gallbladder: 9 mm stone in the gallbladder neck region. Gallbladder
otherwise unremarkable. No gallbladder wall thickening,
pericholecystic fluid or other signs of acute cholecystitis.

Common bile duct: Diameter: Normal at 5 mm

Liver: Diffusely echogenic indicating fatty infiltration. No focal
lesion identified. Some portions obscured by overlying bowel gas.

IVC: Obscured by overlying bowel gas.

Pancreas: Majority is obscured by overlying bowel gas. No
peripancreatic fluid seen.

Spleen: Size and appearance within normal limits.

Right Kidney: Length: 11.1 cm. Echogenicity within normal limits. No
mass or hydronephrosis visualized.

Left Kidney: Length: 12.3 cm. Echogenicity within normal limits. No
mass or hydronephrosis visualized.

Abdominal aorta: Obscured by overlying bowel.

Other findings: None.
IMPRESSION: 1. No acute findings.  Study limitations detailed above.
2. Fatty infiltration of the liver.
3. Cholelithiasis without evidence of acute cholecystitis.

## 2016-12-07 ENCOUNTER — Telehealth: Payer: Self-pay | Admitting: Internal Medicine

## 2016-12-07 ENCOUNTER — Encounter: Payer: Self-pay | Admitting: Internal Medicine

## 2016-12-07 ENCOUNTER — Ambulatory Visit: Payer: Medicare Other | Admitting: Internal Medicine

## 2016-12-07 VITALS — BP 120/82 | HR 92

## 2016-12-07 DIAGNOSIS — E1165 Type 2 diabetes mellitus with hyperglycemia: Secondary | ICD-10-CM | POA: Diagnosis not present

## 2016-12-07 DIAGNOSIS — E785 Hyperlipidemia, unspecified: Secondary | ICD-10-CM | POA: Diagnosis not present

## 2016-12-07 DIAGNOSIS — Z794 Long term (current) use of insulin: Secondary | ICD-10-CM | POA: Diagnosis not present

## 2016-12-07 LAB — POCT GLYCOSYLATED HEMOGLOBIN (HGB A1C): HEMOGLOBIN A1C: 8.6

## 2016-12-07 MED ORDER — FENOFIBRATE 145 MG PO TABS: 145.0000 mg | ORAL_TABLET | Freq: Every day | ORAL | 3 refills | Status: DC

## 2016-12-07 MED ORDER — INSULIN LISPRO 100 UNIT/ML (KWIKPEN): 22.0000 [IU] | PEN_INJECTOR | Freq: Three times a day (TID) | SUBCUTANEOUS | 3 refills | Status: DC

## 2016-12-07 NOTE — Telephone Encounter (Signed)
[name], need another PA for [employer], and she asked for a call back

## 2016-12-07 NOTE — Telephone Encounter (Signed)
Submitted PA into insurance for freestyle today.

## 2016-12-07 NOTE — Addendum Note (Signed)
Addended by: Darene LamerHOMPSON, Lonzie Simmer T on: 12/07/2016 11:49 AM   Modules accepted: Orders

## 2016-12-07 NOTE — Patient Instructions (Addendum)
Please continue; - Toujeo 80 units at bedtime - Trulicity 1.5 mg in am  Please increase: - Humalog: - 22 units with a smaller meal - 25 units with a regular meal  - 28 units with a larger meal  If you do not eat b'fast, and sugars are >130, give 10 units of NovoLog.  Please come back for a follow-up appointment in 3 months.

## 2016-12-07 NOTE — Progress Notes (Signed)
Patient ID: [name], male   DOB: 09/29/1965, 51 y.o.   MRN: [id number]  HPI: [name] is a 51 y.o.-year-old male, returning to f/u for DM2, dx in 2003, insulin-dependent, uncontrolled, without complications. Last visit 3.5 mo ago.  He is here with his mother who offers most of the history (and is the primary caregiver), as patient has difficulty talking. He has [employer] insurance.  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 In the last years, his swallowing is progressively worse. Mother is primary caregiver. She is checking his sugars and gives him the medicines He [place] (reviewed note) >> had a modified Ba swalowing study: abnormal.   He now has the Abiquiu[employer]re CGM. Needs another PA for the CGM as his fingers are bent.  Hemoglobin A1c levels: Lab Results  Component Value Date   HGBA1C 8.3 09/06/2016   HGBA1C 8.0 05/17/2016   HGBA1C 8.2 02/25/2016  06/21/2015: HbA1c 8.2% 02/23/2015: HbA1C: 6.3% 12/15/2013: HbA1c 8.9% 06/12/2013: HbA1c 7.8% 12/13/2012: HbA1c 7.5% 12/07/2011: HbA1c 6.9% 05/30/2010: HbA1c 8.3%  Pt is on a regimen of: - Toujeo 80 units at bedtime - Trulicity 1.5 mg in am - Humalog - skips b'fast - 18 units with a smaller meal - 20 units with a regular meal  - 22 units with a larger meal He tried Metformin >> diarrhea. We tried again Metformin >> diarrhea >> now stopped.  He was on Onglyza >> now stopped.  Pt's mom was checking his sugars 2x a day: - am: 145-222, 261 >> 168-195, 218 >> 178-310 - 2h after b'fast: 123-197 >> 170-203 >> n/c - before lunch: 136-173, 203 >> 192, 294 >> 137-248, 342 - 2h after lunch: 164, 206, 363 >> 192, 243-329 >> n/c - before dinner: 165-256 >> 175-295 >> 142-294, 316 - after dinner: 229, 252 >> 209, 223 >> 240-268 >> 188-262, 307 - bedtime: 197 >> n/c >> 96, 138, 182, 233 >> n/c >> 178-361 No  lows; he has hypoglycemia awareness at 70.   Pt's  meals are: - Breakfast: skips - Lunch: soup, sandwich, burger, diet Mtn dew - Dinner: grilled chicken, veggies, starch, diet sods - Snacks: PB, cookie  - No CKD, last BUN/creatinine:  Lab Results  Component Value Date   BUN 10 09/06/2016   CREATININE 0.21 (L) 09/06/2016   - + HL;  last set of lipids: Lab Results  Component Value Date   CHOL 174 09/06/2016   HDL 28.90 (L) 09/06/2016   LDLCALC 60 12/15/2013   LDLDIRECT 95.0 09/06/2016   TRIG (H) 09/06/2016    537.0 Triglyceride is over 400; calculations on Lipids are invalid.   CHOLHDL 6 09/06/2016  02/23/2015: 138/37/306/40  - last eye exam was in 2017 >> 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 - no numbness and tingling in his feet. Has good sensation, but cannot move.   ROS: Constitutional: no weight gain/no weight loss, no fatigue, no subjective hyperthermia, no subjective hypothermia Eyes: no blurry vision, no xerophthalmia ENT: no sore throat, no nodules palpated in throat, no dysphagia, no odynophagia, no hoarseness Cardiovascular: no CP/no SOB/no palpitations/no leg swelling Respiratory: no cough/no SOB/no wheezing Gastrointestinal: no N/no V/no D/no C/no acid reflux Musculoskeletal: no muscle aches/no joint aches Skin: no rashes, no hair loss Neurological: no tremors/no numbness/no tingling/no dizziness  I reviewed pt's medications, allergies, PMH, social hx, family hx, and changes were documented in the history of present illness. Otherwise,  unchanged from my initial visit note.  Past Surgical History:  Procedure Laterality Date  . muscle bisopy     History   Social History  . Marital Status: single    Spouse Name: N/A    Number of Children: 0   Occupational History  . disabled   Social History Main Topics  . Smoking status: Former Games developer  . Smokeless tobacco: Not on file  . Alcohol Use: No  . Drug Use: No   Current Outpatient Medications on File Prior to Visit  Medication Sig Dispense Refill  . BD PEN NEEDLE NANO U/F  32G X 4 MM MISC USE ONE NEEDLE DAILY WITH LANTUS SOLOSTAR AS DIRECTED 100 each 3  . CANASA 1000 MG suppository Place 1,000 mg rectally at bedtime as needed (for ulcerative proctitis).   1  . glucose blood (ONETOUCH VERIO) test strip Use to test blood sugar 3 times daily as instructed. 300 each 11  . HUMALOG KWIKPEN 100 UNIT/ML KiwkPen INJECT 10-14 UNITS INTO THE SKIN 3 TIMES DAILY WITH MEALS 15 pen 0  . HYDROcodone-acetaminophen (HYCET) 7.5-325 mg/15 ml solution Take 10 mLs by mouth at bedtime.    . insulin lispro (HUMALOG KWIKPEN) 100 UNIT/ML KiwkPen Inject 10-14 units into the skin 3 times daily with meals 15 mL 3  . lisinopril (PRINIVIL,ZESTRIL) 10 MG tablet Take 10 mg by mouth daily.  3  . omeprazole (PRILOSEC) 40 MG capsule Take 1 capsule (40 mg total) by mouth daily. 14 capsule 1  . ondansetron (ZOFRAN ODT) 4 MG disintegrating tablet Take 1 tablet (4 mg total) by mouth every 8 (eight) hours as needed for nausea or vomiting. 20 tablet 0  . ONETOUCH DELICA LANCETS 33G MISC Use to test blood sugar 2 times daily as instructed. 100 each 11  . potassium chloride 20 MEQ/15ML (10%) SOLN TAKE 1 TABLESPOONFUL (15 ML) ONCE A DAY ORALLY  6  . rosuvastatin (CRESTOR) 5 MG tablet Take 5 mg by mouth at bedtime.     . TOUJEO SOLOSTAR 300 UNIT/ML SOPN INJECT 80 UNITS INTO THE SKIN AT BEDTIME. 14 pen 3  . TRULICITY 1.5 MG/0.5ML SOPN INJECT 1.5 MG INTO THE SKIN ONCE WEEKLY 6 pen 1  . Continuous Blood Gluc Receiver (FREESTYLE LIBRE READER) DEVI See admin instructions.  0  . Continuous Blood Gluc Sensor (FREESTYLE LIBRE SENSOR SYSTEM) MISC CHANGE SENSOR EVERY 10 DAYS.  3   No current facility-administered medications on file prior to visit.     Allergies  Allergen Reactions  . Bactrim Ds [Sulfamethoxazole-Trimethoprim] Other (See Comments)    Pill causes gagging  . Carafate [Sucralfate] Other (See Comments)    Gagging  . Latex Other (See Comments)    Extremely irritated, fire engine red skin  .  Penicillins Hives    Has patient had a PCN reaction causing immediate rash, facial/tongue/throat swelling, SOB or lightheadedness with hypotension:unknown Has patient had a PCN reaction causing severe rash involving mucus membranes or skin necrosis:unknown Has patient had a PCN reaction that required hospitalization:unknown Has patient had a PCN reaction occurring within the last 10 years:infant reaction If all of the above answers are "NO", then may proceed with Cephalosporin use.    Family History  Problem Relation Age of Onset  . Diabetes Mother   . Colon polyps Mother   . Heart disease Father        CAD  . Diabetes Father    PE: BP 120/82 (BP Location: Left Arm, Patient Position: Sitting)   Pulse  92   SpO2 97%  There is no height or weight on file to calculate BMI. 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  Constitutional: overweight, in NAD, in wheelchairin wheelchair. Moves few muscle groups, but can talk and eat  Eyes: PERRLA, EOMI, no exophthalmos ENT: moist mucous membranes, no thyromegaly, no cervical lymphadenopathy Cardiovascular: tachycardia, RR, No MRG, + B hand swelling Respiratory: CTA B Gastrointestinal: abdomen soft, NT, ND, BS+ Musculoskeletal: tetraparesis Skin: moist, warm, no rashes Neurological: tetraparesis  ASSESSMENT: 1. DM2, insulin-dependent, uncontrolled, without long term complications, But with hyperglycemia  2. HL  PLAN:  1. Patient with long-standing, uncontrolled diabetes, on basal-bolus insulin regimen (with doses adjusted at last visit) + weekly GLP-1 receptor agonist.  Despite increasing the insulin at last visit, his sugars are higher and he feels that this is because he eats more carbs (starches) as he cannot swallow well other foods.  At this visit, we reviewed his log, in which his mother documents  sugars extracted from his freestyle libre CGM.  Sugars are high at all times of the day.  His skips breakfast therefore he does not take any insulin with this meal.  His sugars increase throughout the day, I advised him to take a lower dose of insulin in the morning if sugars are high, even if he does not eat.  In the meantime, we will also increase his Humalog doses further, but would like to keep Toujeo the same.   - We discussed about the possibility of a VGo patch pump, which his mother is using with good results, but I explained that he is on too much insulin for this to be efficient for him.  He does not qualify for U500 insulin yet, since he is only using 124 units of insulin per day for now and we will increase it to slightly more than that, but still lower than 200 units a day. - I advised him to: Patient Instructions  Please continue; - Toujeo 80 units at bedtime - Trulicity 1.5 mg in am  Please increase: - Humalog: - 22 units with a smaller meal - 25 units with a regular meal  - 28 units with a larger meal  If you do not eat b'fast, and sugars are >130, give 10 units of NovoLog.  Please come back for a follow-up appointment in 3 months.  - I advised him to let me know about blood sugars if they are still staying high, before next visit - today, HbA1c is 8.6% (continues to increase) - continue checking sugars at different times of the day - check 3x a day, rotating checks - advised for yearly eye exams >> he is UTD - + flu shot - Return to clinic in 3 mo with sugar log   2. HL - TG high at last check   - On Crestor, but I also recommended fenofibrate >> did not start >> will start now   Carlus Pavlovristina Olson Lucarelli, MD PhD [facility]

## 2016-12-13 ENCOUNTER — Telehealth: Payer: Self-pay

## 2016-12-13 NOTE — Telephone Encounter (Signed)
Called patient and advised that we had received notification from [employer] that the freestyle would not be covered under his Medicare Part D. I advised mother that she could call, and see if there was anything else we could do, but the PA was completed and this is what we got back.  Mother understood, and would call insurance to see what was going on.

## 2016-12-25 ENCOUNTER — Other Ambulatory Visit: Payer: Self-pay

## 2016-12-25 MED ORDER — FENOFIBRATE 134 MG PO CAPS: 134.0000 mg | ORAL_CAPSULE | Freq: Every day | ORAL | 2 refills | Status: DC

## 2016-12-25 NOTE — Telephone Encounter (Signed)
Fenofibrate is too large for him to swallow can we consider changing it to the capsules 135 mg? [name] from [phone]

## 2016-12-25 NOTE — Telephone Encounter (Signed)
Yes, let's use the micronized one

## 2016-12-25 NOTE — Telephone Encounter (Signed)
Please advise. Thank you

## 2016-12-25 NOTE — Telephone Encounter (Signed)
Submitted

## 2017-01-11 ENCOUNTER — Telehealth: Payer: Self-pay | Admitting: Podiatry

## 2017-01-11 NOTE — Telephone Encounter (Signed)
I spoke with pt's mtr, Harriett SineNancy and told her if felt pt should be seen tomorrow and she stated she called again and has an appt for tomorrow at about 11:00am with Dr. Charlsie Merlesegal. I told Harriett Sineancy to continue with either the epsom salt soaks or white vinegar soaks. Harriett Sineancy states understanding.

## 2017-01-11 NOTE — Telephone Encounter (Signed)
My son has an appointment to see Dr. Charlsie Merlesegal on Monday for his right great toe that is painful and red. I have been soaking it epsom salt water but it is still extremely sore. I just wondered if there was anything else I can try for his toe until we can get him there Monday. Our phone number is [phone]. Thank you.

## 2017-01-12 ENCOUNTER — Encounter: Payer: Self-pay | Admitting: Podiatry

## 2017-01-12 ENCOUNTER — Ambulatory Visit: Payer: Medicare Other | Admitting: Podiatry

## 2017-01-12 DIAGNOSIS — L03031 Cellulitis of right toe: Secondary | ICD-10-CM

## 2017-01-12 NOTE — Patient Instructions (Signed)
Epsom Salt Soak Instructions    I Place 1/4 cup of epsom salts in a quart of warm tap water.  Soak your foot or feet in the solution for 20 minutes twice a day until you notice the area has dried and a scab has formed. Continue to apply other medications to the area as directed by the doctor such as polysporin, neosporin or cortisporin drops.  IF YOUR SKIN BECOMES IRRITATED WHILE USING THESE INSTRUCTIONS, IT IS OKAY TO SWITCH TO  WHITE VINEGAR AND WATER. Or you may use antibacterial soap and water to keep the toe clean  Monitor for any signs/symptoms of infection. Call the office immediately if any occur or go directly to the emergency room. Call with any questions/concerns.

## 2017-01-13 NOTE — Progress Notes (Signed)
Subjective:   Patient ID: [name], male   DOB: 51 y.o.   MRN: [id number]   HPI Patient presents with a painful right hallux nail lateral side that has got some redness in a wheelchair in poor health.  Patient does not smoke and is not able to be active   Review of Systems  All other systems reviewed and are negative.       Objective:  Physical Exam  Constitutional: He appears well-developed and well-nourished.  Cardiovascular: Intact distal pulses.  Pulmonary/Chest: Effort normal.  Musculoskeletal: Normal range of motion.  Neurological: He is alert.  Skin: Skin is warm.  Nursing note and vitals reviewed.   Patient does have palpable pulses but they are weak and he is in a wheelchair with edema in his ankle regions bilateral.  He has an incurvated nail border right hallux lateral side with localized redness with no proximal edema erythema or drainage noted.  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  Instructed on soaks and gave strict instructions of any proximal erythema edema or drainage were to occur to let us know immediately

## 2017-01-15 ENCOUNTER — Ambulatory Visit: Payer: Medicare Other | Admitting: Podiatry

## 2017-01-31 ENCOUNTER — Telehealth: Payer: Self-pay | Admitting: Podiatry

## 2017-01-31 NOTE — Telephone Encounter (Signed)
Pt's mtr, [name] states pt's toe is remaining very red and painful, too painful for a shoe. I told [name] I felt pt would benefit from an appt, to change soaks to 1/2 Cup White vinegar in a quart of warm water, and change the antibiotic cream to bacitracin which was not as irritating to some pts, perform the soaks until seen. I transferred [place] to schedulers.

## 2017-01-31 NOTE — Telephone Encounter (Signed)
I'm calling in regards to my son. He saw Dr. Charlsie Merlesegal on 14 December and Dr. Charlsie Merlesegal had to cut a portion of his right great toenail.  His toe is staying very red and I'm thinking it might be infected. I've been using the epsom salt water like he recommended and I have also been using neosporin and putting a band-aid on his toe. His toe is still extremely sore and like I said very red. I didn't know if I needed to bring him back and let Dr. Charlsie Merlesegal check the toe or if he needed to call in an antibiotic for him. If he needs to call in an antibiotic the only problem is he has a hard time swallowing large pills, so he would need either a small pill or a liquid form called in. The phone number is [phone]. Thank you.

## 2017-02-01 ENCOUNTER — Ambulatory Visit: Payer: Medicare Other | Admitting: Podiatry

## 2017-02-01 ENCOUNTER — Encounter: Payer: Self-pay | Admitting: Podiatry

## 2017-02-01 DIAGNOSIS — L03031 Cellulitis of right toe: Secondary | ICD-10-CM | POA: Diagnosis not present

## 2017-02-01 MED ORDER — CLINDAMYCIN PALMITATE HCL 75 MG/5ML PO SOLR: 150.0000 mg | Freq: Three times a day (TID) | ORAL | 0 refills | Status: AC

## 2017-02-01 NOTE — Patient Instructions (Signed)
ANTIBACTERIAL SOAP INSTRUCTIONS  THE DAY AFTER PROCEDURE  Please follow the instructions your doctor has marked.   Shower as usual. Before getting out, place a drop of antibacterial liquid soap (Dial) on a wet, clean washcloth.  Gently wipe washcloth over affected area.  Afterward, rinse the area with warm water.  Blot the area dry with a soft cloth and cover with antibiotic ointment (neosporin, polysporin, bacitracin) and band aid or gauze and tape  Place 3-4 drops of antibacterial liquid soap in a quart of warm tap water.  Submerge foot into water for 20 minutes.  If bandage was applied after your procedure, leave on to allow for easy lift off, then remove and continue with soak for the remaining time.  Next, blot area dry with a soft cloth and cover with a bandage.  Apply other medications as directed by your doctor, such as cortisporin otic solution (eardrops) or neosporin antibiotic ointment  Long Term Care Instructions-Post Nail Surgery  You have had your ingrown toenail and root treated with a chemical.  This chemical causes a burn that will drain and ooze like a blister.  This can drain for 6-8 weeks or longer.  It is important to keep this area clean, covered, and follow the soaking instructions dispensed at the time of your surgery.  This area will eventually dry and form a scab.  Once the scab forms you no longer need to soak or apply a dressing.  If at any time you experience an increase in pain, redness, swelling, or drainage, you should contact the office as soon as possible.   Betadine Soak Instructions  Purchase an 8 oz. bottle of BETADINE solution (Povidone)  THE DAY AFTER THE PROCEDURE  Place 1 tablespoon of betadine solution in a quart of warm tap water.  Submerge your foot or feet with outer bandage intact for the initial soak; this will allow the bandage to become moist and wet for easy lift off.  Once you remove your bandage, continue to soak in the solution for 20  minutes.  This soak should be done twice a day.  Next, remove your foot or feet from solution, blot dry the affected area and cover.  You may use a band aid large enough to cover the area or use gauze and tape.  Apply other medications to the area as directed by the doctor such as cortisporin otic solution (ear drops) or neosporin.  IF YOUR SKIN BECOMES IRRITATED WHILE USING THESE INSTRUCTIONS, IT IS OKAY TO SWITCH TO EPSOM SALTS AND WATER OR WHITE VINEGAR AND WATER.  Long Term Care Instructions-Post Nail Surgery  You have had your ingrown toenail and root treated with a chemical.  This chemical causes a burn that will drain and ooze like a blister.  This can drain for 6-8 weeks or longer.  It is important to keep this area clean, covered, and follow the soaking instructions dispensed at the time of your surgery.  This area will eventually dry and form a scab.  Once the scab forms you no longer need to soak or apply a dressing.  If at any time you experience an increase in pain, redness, swelling, or drainage, you should contact the office as soon as possible.

## 2017-02-08 ENCOUNTER — Telehealth: Payer: Self-pay | Admitting: Podiatry

## 2017-02-08 NOTE — Telephone Encounter (Signed)
Pt's mtr, Harriett Sineancy states pt's foot and leg are swollen and he has a rash to his legs. I told Harriett Sineancy to stop pt's Clindamycin, begin liquid benadryl as package instructs, and I would like her to have pt seen tomorrow. Pt is scheduled for an appt 02/09/2017 at 2:45pm.

## 2017-02-08 NOTE — Telephone Encounter (Signed)
My son saw Dr. Charlsie Merlesegal on 03 January and was placed on a liquid antibiotic. I was looking at it and it said to call and let the doctor know if any symptoms such as a rash and swelling occurs. [name] has a rash on both legs as well as swelling in his right foot and leg so that's why I was calling and also to find out what I need to do. My phone number is [phone]. Thank you.

## 2017-02-09 ENCOUNTER — Ambulatory Visit: Payer: Medicare Other | Admitting: Podiatry

## 2017-02-09 ENCOUNTER — Encounter: Payer: Self-pay | Admitting: Podiatry

## 2017-02-09 DIAGNOSIS — L03031 Cellulitis of right toe: Secondary | ICD-10-CM

## 2017-02-14 ENCOUNTER — Other Ambulatory Visit: Payer: Self-pay | Admitting: Internal Medicine

## 2017-02-14 NOTE — Progress Notes (Signed)
Subjective:   Patient ID: [name], male   DOB: 52 y.o.   MRN: [id number]   HPI Patient presents with caregiver stating that he is doing better today after using vinegar last night but he wanted to have his big toe checked.  Patient points the right hallux where there is some localized redness noted   ROS      Objective:  Physical Exam  No change neurovascular status with patient in a wheelchair and noted on the hallux right to have some macerated tissue that is localized with no proximal edema erythema or drainage noted.     Assessment:  Poor health individual who is in a wheelchair with venous edema who is got irritation of the right hallux with localized irritation of tissue     Plan:  Patient seems to be doing better with white vinegar soaks and I advised on continuing this and I did advise if this does not heal that we will need to consider again other nail procedure beyond trying to just clean up the tissue.  I am trying to be as conservative as I can with this due to his poor health condition and I did discuss at one point amputation in the future may be necessary if this does not heal or if a more aggressive infection were to occur.  At this point continue white vinegar and will be seen back if any issues were to occur

## 2017-03-07 ENCOUNTER — Ambulatory Visit: Payer: Medicare Other | Admitting: Internal Medicine

## 2017-03-07 ENCOUNTER — Encounter: Payer: Self-pay | Admitting: Internal Medicine

## 2017-03-07 VITALS — BP 118/72 | HR 89 | Temp 98.0°F

## 2017-03-07 DIAGNOSIS — Z794 Long term (current) use of insulin: Secondary | ICD-10-CM | POA: Diagnosis not present

## 2017-03-07 DIAGNOSIS — E785 Hyperlipidemia, unspecified: Secondary | ICD-10-CM

## 2017-03-07 DIAGNOSIS — E1165 Type 2 diabetes mellitus with hyperglycemia: Secondary | ICD-10-CM

## 2017-03-07 NOTE — Progress Notes (Signed)
Patient ID: ARUSH GATLIFF, male   DOB: 1965/06/05, 52 y.o.   MRN: [id number]  HPI: DEARIUS HOFFMANN is a 52 y.o.-year-old male, returning to f/u for DM2, dx in 2003, insulin-dependent, uncontrolled, without complications. Last visit 3 mo ago.  As usual, he is here with his mother who offers most of the history (is the primary caregiver), as patient has difficulty talking. He has [employer].  Patient has SMA type III (motor neuron disease), dx 1972, and he is in a wheelchair.  His swallowing continues to do great.  His mother is checking his sugars and give him the medicines.  Unfortunately, she is also sick and occasionally she cannot give him the injections.  He only received 1 out of 3 Humalog injections before meals since last visit.  As a consequence, his sugars are higher.   He now has the libre CGM, but he brought his log today for review.  He had to have a PA for the CGM as his fingers are bent.  Hemoglobin A1c levels: Lab Results  Component Value Date   HGBA1C 8.6 12/07/2016   HGBA1C 8.3 09/06/2016   HGBA1C 8.0 05/17/2016  06/21/2015: HbA1c 8.2% 02/23/2015: HbA1C: 6.3% 12/15/2013: HbA1c 8.9% 06/12/2013: HbA1c 7.8% 12/13/2012: HbA1c 7.5% 12/07/2011: HbA1c 6.9% 05/30/2010: HbA1c 8.3%  Pt is on a regimen of: - Toujeo 80 units at bedtime - Trulicity 1.5 mg in am - Humalog - skipped 2x a day as mother was sick; also, when he was taking the insulin, he was taking lower doses than advised at last visit - 18 units with a smaller meal - 20 units with a regular meal  - 22 units with a larger meal He tried Metformin x2 >> diarrhea. He was on Onglyza >> now stopped while on Trulicity.  Pt's mom was checking his sugars 2x a day: - am: 145-222, 261 >> 168-195, 218 >> 178-310 >> 68, 185-225, 319 - 2h after b'fast: 123-197 >> 170-203 >> n/c - before lunch: 136-173, 203 >> 192, 294 >> 137-248, 342 >> 180-226 - 2h after lunch: 164, 206, 363 >> 192, 243-329 >> n/c >> 181-285 - before  dinner: 165-256 >> 175-295 >> 142-294, 316 >> 132 - after dinner: 229, 252 >> 209, 223 >> 240-268 >> 188-262, 307 >> 260 - bedtime: 197 >> n/c >> 96, 138, 182, 233 >> n/c >> 178-361 >>> 292 No  lows; he has hypoglycemia awareness at 70.   Pt's meals are: - Breakfast: skips - Lunch: soup, sandwich, burger, diet Mtn dew - Dinner: grilled chicken, veggies, starch, diet sods - Snacks: PB, cookie  - No CKD, last BUN/creatinine:  Lab Results  Component Value Date   BUN 10 09/06/2016   CREATININE 0.21 (L) 09/06/2016  On Lisinopril. - + HL;  last set of lipids: Lab Results  Component Value Date   CHOL 174 09/06/2016   HDL 28.90 (L) 09/06/2016   LDLCALC 60 12/15/2013   LDLDIRECT 95.0 09/06/2016   TRIG (H) 09/06/2016    537.0 Triglyceride is over 400; calculations on Lipids are invalid.   CHOLHDL 6 09/06/2016  02/23/2015: 138/37/306/40  On Crestor 40. Did not start Fenofibrate as the capsule is large and did nt want to break it.  - last eye exam was in 2017 >> 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 - NO numbness and tingling in his feet. Has good sensation, but cannot move. R foot ingrown nail >> infected >> sees Dr. Charlsie Merles. Soaking it every night and applies ABx ointment.  ROS: Constitutional: no weight gain/no weight loss, no fatigue, no subjective hyperthermia, no subjective hypothermia Eyes: no blurry vision, no xerophthalmia ENT: no sore throat, no nodules palpated in throat, no dysphagia, no odynophagia, no hoarseness Cardiovascular: no CP/no SOB/no palpitations/+ leg swelling Respiratory: no cough/no SOB/no wheezing Gastrointestinal: no N/no V/no D/no C/no acid reflux Musculoskeletal: no muscle aches/no joint aches Skin: + rashes, no hair loss Neurological: no tremors/no numbness/no tingling/no dizziness  I reviewed pt's medications, allergies, PMH, social hx, family hx, and changes were documented in the history of present illness. Otherwise, unchanged from my initial visit note.  Past Surgical  History:  Procedure Laterality Date  . muscle bisopy     History   Social History  . Marital Status: single    Spouse Name: N/A    Number of Children: 0   Occupational History  . disabled   Social History Main Topics  . Smoking status: Former Games developer  . Smokeless tobacco: Not on file  . Alcohol Use: No  . Drug Use: No   Current Outpatient Medications on File Prior to Visit  Medication Sig Dispense Refill  . BD PEN NEEDLE NANO U/F 32G X 4 MM MISC USE ONE NEEDLE DAILY WITH LANTUS SOLOSTAR AS DIRECTED 100 each 0  . CANASA 1000 MG suppository Place 1,000 mg rectally at bedtime as needed (for ulcerative proctitis).   1  . Continuous Blood Gluc Receiver (FREESTYLE LIBRE READER) DEVI See admin instructions.  0  . Continuous Blood Gluc Sensor (FREESTYLE LIBRE SENSOR SYSTEM) MISC CHANGE SENSOR EVERY 10 DAYS.  3  . fenofibrate (TRICOR) 145 MG tablet Take 1 tablet (145 mg total) daily by mouth. 90 tablet 3  . fenofibrate micronized (LOFIBRA) 134 MG capsule Take 1 capsule (134 mg total) by mouth daily before breakfast. 30 capsule 2  . glucose blood (ONETOUCH VERIO) test strip Use to test blood sugar 3 times daily as instructed. 300 each 11  . HYDROcodone-acetaminophen (HYCET) 7.5-325 mg/15 ml solution Take 10 mLs by mouth at bedtime.    . insulin lispro (HUMALOG KWIKPEN) 100 UNIT/ML KiwkPen Inject 0.22-0.28 mLs (22-28 Units total) 3 (three) times daily into the skin. 30 mL 3  . lisinopril (PRINIVIL,ZESTRIL) 10 MG tablet Take 10 mg by mouth daily.  3  . omeprazole (PRILOSEC) 40 MG capsule Take 1 capsule (40 mg total) by mouth daily. 14 capsule 1  . ondansetron (ZOFRAN ODT) 4 MG disintegrating tablet Take 1 tablet (4 mg total) by mouth every 8 (eight) hours as needed for nausea or vomiting. 20 tablet 0  . ONETOUCH DELICA LANCETS 33G MISC Use to test blood sugar 2 times daily as instructed. 100 each 11  . potassium chloride 20 MEQ/15ML (10%) SOLN TAKE 1 TABLESPOONFUL (15 ML) ONCE A DAY ORALLY  6   . rosuvastatin (CRESTOR) 5 MG tablet Take 5 mg by mouth at bedtime.     . TOUJEO SOLOSTAR 300 UNIT/ML SOPN INJECT 80 UNITS INTO THE SKIN AT BEDTIME. 14 pen 3  . TRULICITY 1.5 MG/0.5ML SOPN INJECT 1.5 MG INTO THE SKIN ONCE WEEKLY 6 pen 1   No current facility-administered medications on file prior to visit.     Allergies  Allergen Reactions  . Bactrim Ds [Sulfamethoxazole-Trimethoprim] Other (See Comments)    Pill causes gagging  . Carafate [Sucralfate] Other (See Comments)    Gagging  . Latex Other (See Comments)    Extremely irritated, fire engine red skin  . Penicillins Hives    Has patient had a  PCN reaction causing immediate rash, facial/tongue/throat swelling, SOB or lightheadedness with hypotension:unknown Has patient had a PCN reaction causing severe rash involving mucus membranes or skin necrosis:unknown Has patient had a PCN reaction that required hospitalization:unknown Has patient had a PCN reaction occurring within the last 10 years:infant reaction If all of the above answers are "NO", then may proceed with Cephalosporin use.   . Clindamycin/Lincomycin Itching, Swelling and Rash   Family History  Problem Relation Age of Onset  . Diabetes Mother   . Colon polyps Mother   . Heart disease Father        CAD  . Diabetes Father    PE: BP 118/72 (BP Location: Left Arm, Patient Position: Sitting, Cuff Size: Normal)   Pulse 89   Temp 98 F (36.7 C) (Oral)   SpO2 99%  There is no height or weight on file to calculate BMI. 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  Constitutional: overweight, in NAD, in wheelchairin wheelchair. Moves few muscle groups, but can talk and eat  Eyes: PERRLA, EOMI, no exophthalmos ENT: moist mucous membranes, no thyromegaly, no cervical lymphadenopathy Cardiovascular: tachycardia, RR, No MRG, + B hand  swelling Respiratory: CTA B Gastrointestinal: abdomen soft, NT, ND, BS+ Musculoskeletal: tetraparesis Skin: moist, warm, no rashes Neurological: tetraparesis  ASSESSMENT: 1. DM2, insulin-dependent, uncontrolled, without long term complications, But with hyperglycemia  2. HL  PLAN:  1. Patient with long-standing, uncontrolled, diabetes, on basal-bolus insulin regimen and GLP-1 receptor agonist with worse sugars at this visit Humalog doses before meals.  These are administered by her mom, which was sick and not available to give him the injections consistently.  His father can also give him the injections but did not do this while mother was taught at home.  I strongly advised him to have the father do the injections when needed.  We did discuss abou alternative regimen with Humalog 50/50 to reduce the number of injections, however, he is eating his first meal of the day between 12 and 2 PM and the last meal of the day around 7 PM, and this was not work well with this type of regimen.  We decided to stay with Toujeo and also continue the Humalog insulin doses before meals, which are still the most flexible regimen we have available.  We discussed briefly about Afrezza, the inhaled insulin, but I do not think this would be a good option for him. - I advised him to: With  Please continue: - Trulicity 1.5 mg in am - Toujeo 80 units at bedtime  Please increase: - Humalog: - 22 units with a smaller meal - 25 units with a regular meal  - 28 units with a larger meal If you do not eat b'fast, and sugars are >130, give 10 units of NovoLog.  Please come back for a follow-up appointment in 3 months.  - today, HbA1c is 9.1% (higher) - continue checking sugars at different times of the day - check 3x a day, rotating checks - advised for yearly eye exams >> he is not UTD - Return to clinic in 3 mo with sugar log   2. HL - Triglycerides were high at last check, so I suggested to start fenofibrate.   He initially did not start because of the large tablet.  I sent a capsule that  could be broken so he could take the contents in applesauce, but he refused to start. - He continues on Crestor 40.  No side effects.   Carlus Pavlov, MD PhD [facility] Endocrinology

## 2017-03-07 NOTE — Patient Instructions (Addendum)
Please continue: - Trulicity 1.5 mg in am - Toujeo 80 units at bedtime  Please increase: - Humalog: - 22 units with a smaller meal - 25 units with a regular meal  - 28 units with a larger meal If you do not eat b'fast, and sugars are >130, give 10 units of NovoLog.  Please come back for a follow-up appointment in 3 months.

## 2017-03-08 LAB — POCT GLYCOSYLATED HEMOGLOBIN (HGB A1C): HEMOGLOBIN A1C: 9.1

## 2017-03-08 NOTE — Addendum Note (Signed)
Addended by: Neville RouteJAIMES, PATRICIA G on: 03/08/2017 08:06 AM   Modules accepted: Orders

## 2017-03-09 ENCOUNTER — Other Ambulatory Visit: Payer: Self-pay | Admitting: Internal Medicine

## 2017-03-16 ENCOUNTER — Ambulatory Visit: Payer: Medicare Other | Admitting: Podiatry

## 2017-03-16 ENCOUNTER — Encounter: Payer: Self-pay | Admitting: Podiatry

## 2017-03-16 ENCOUNTER — Other Ambulatory Visit: Payer: Self-pay | Admitting: Internal Medicine

## 2017-03-16 DIAGNOSIS — L03031 Cellulitis of right toe: Secondary | ICD-10-CM | POA: Diagnosis not present

## 2017-03-16 NOTE — Progress Notes (Signed)
Subjective:   Patient ID: [name], male   DOB: 52 y.o.   MRN: [id number]   HPI Patient presents with mother concerned because are still mild redness in the toe even though it appears somewhat better   ROS      Objective:  Physical Exam  Neurovascular unchanged with continued edema in the foot with patient in wheelchair which is a complicating factor.  The right hallux is continuing to be red but it is localized with no proximal edema erythema or drainage noted     Assessment:  I do think gradually improving with redness still present but it is local with no proximal edema erythema or drainage noted     Plan:  Patient is not in current pain and the redness is starting to lighten so I did explain that hopefully we are on the mend with continued soaks recommended and letting it air dry at home.  He cannot take antibiotics as they are very irritated to him as were trying to hold off on this and he will continue with this conservative approach and it should gradually get better over time

## 2017-03-20 ENCOUNTER — Other Ambulatory Visit: Payer: Self-pay | Admitting: Internal Medicine

## 2017-03-20 NOTE — Telephone Encounter (Signed)
OK 

## 2017-03-20 NOTE — Telephone Encounter (Signed)
Is this okay to refill? 

## 2017-03-29 ENCOUNTER — Telehealth: Payer: Self-pay | Admitting: Internal Medicine

## 2017-03-29 NOTE — Telephone Encounter (Signed)
Pts mother states that pt would like to go ahead and have a new prescription sent in for the 14 day libra sensor.    States they spoke with Dr during patients last visit about him having the 10 day and were going to call pharmacy to see price difference between the 14 and 10 before getting prescription sent in.   Please advise   [id number] - , [place] - [address]

## 2017-03-30 MED ORDER — FREESTYLE LIBRE 14 DAY SENSOR MISC: 1.0000 | 11 refills | Status: DC

## 2017-03-30 MED ORDER — FREESTYLE LIBRE 14 DAY READER DEVI: 1.0000 | Freq: Once | 0 refills | Status: AC

## 2017-03-30 NOTE — Telephone Encounter (Signed)
Sent to pharmacy 

## 2017-05-30 ENCOUNTER — Encounter: Payer: Self-pay | Admitting: Podiatry

## 2017-05-30 ENCOUNTER — Encounter: Payer: Self-pay | Admitting: *Deleted

## 2017-05-30 ENCOUNTER — Ambulatory Visit: Payer: Medicare Other | Admitting: Podiatry

## 2017-05-30 DIAGNOSIS — M79674 Pain in right toe(s): Secondary | ICD-10-CM | POA: Diagnosis not present

## 2017-05-30 DIAGNOSIS — L03031 Cellulitis of right toe: Secondary | ICD-10-CM

## 2017-05-30 DIAGNOSIS — B351 Tinea unguium: Secondary | ICD-10-CM

## 2017-05-30 DIAGNOSIS — M79675 Pain in left toe(s): Secondary | ICD-10-CM

## 2017-05-30 NOTE — Progress Notes (Signed)
Subjective:   Patient ID: [name], male   DOB: 52 y.o.   MRN: [id number]   HPI Patient presents stating he is lost half his big toenail right and was concerned and has nail disease 1-5 both feet that he cannot cut and they become painful at times   ROS      Objective:  Physical Exam  Damage right hallux nail with thickness ingrown component and trauma with nail disease 1-5 both feet with incurvation of the beds and pain     Assessment:  Damage right hallux nail with the distal two thirds of the nail falling off and nail disease 1-5 both feet with pain     Plan:  Sterile removal of the distal two thirds of the right nail with no drainage noted and applied Band-Aid and debrided nailbeds 1-5 both feet with no iatrogenic bleeding noted

## 2017-05-30 NOTE — Patient Instructions (Signed)

## 2017-06-06 ENCOUNTER — Ambulatory Visit (INDEPENDENT_AMBULATORY_CARE_PROVIDER_SITE_OTHER): Payer: Medicare Other | Admitting: Internal Medicine

## 2017-06-06 ENCOUNTER — Encounter: Payer: Self-pay | Admitting: Internal Medicine

## 2017-06-06 ENCOUNTER — Other Ambulatory Visit: Payer: Self-pay | Admitting: Internal Medicine

## 2017-06-06 VITALS — BP 142/82 | HR 90

## 2017-06-06 DIAGNOSIS — Z794 Long term (current) use of insulin: Secondary | ICD-10-CM

## 2017-06-06 DIAGNOSIS — E1165 Type 2 diabetes mellitus with hyperglycemia: Secondary | ICD-10-CM

## 2017-06-06 DIAGNOSIS — E785 Hyperlipidemia, unspecified: Secondary | ICD-10-CM

## 2017-06-06 LAB — POCT GLYCOSYLATED HEMOGLOBIN (HGB A1C): Hemoglobin A1C: 8

## 2017-06-06 NOTE — Progress Notes (Signed)
Patient ID: [name], male   DOB: 08/18/1965, 52 y.o.   MRN: [id number]  HPI: [name] is a 52 y.o.-year-old male, returning to f/u for DM2, dx in 2003, insulin-dependent, uncontrolled, without complications. Last visit 3 mo ago.  As usual, he is here with his mother,, who offers part of the history, as the patient has difficulty talking. He has [employer].  Patient has SMA type III (motor neuron disease), dx 1972, and he is in a wheelchair.  His swallowing continues to do great.  His mother is checking his sugars and give him the medicines.  Unfortunately, she is also sick and occasionally she cannot give him the injections.   He continues to check his sugars with a freestyle libre CGM.Marland Kitchen  He had to have a PA for the CGM as his fingers are bent.  Since last visit, he had part of the big nail of R toe removed 2/2 infection - Dr. Charlsie Merles.  Also having pbs with pressure sores on hips. This was managed bu Home Health >> will also go to Wound Care.  Hemoglobin A1c levels: Lab Results  Component Value Date   HGBA1C 9.1 03/08/2017   HGBA1C 8.6 12/07/2016   HGBA1C 8.3 09/06/2016  06/21/2015: HbA1c 8.2% 02/23/2015: HbA1C: 6.3% 12/15/2013: HbA1c 8.9% 06/12/2013: HbA1c 7.8% 12/13/2012: HbA1c 7.5% 12/07/2011: HbA1c 6.9% 05/30/2010: HbA1c 8.3%  Pt is on a regimen of: - Trulicity 1.5 mg in am - Toujeo 80 units at bedtime - Humalog - only takes rarely, in am: - 22 units with a smaller meal - 25 units with a regular meal  - 28 units with a larger meal If you do not eat b'fast, and sugars are >130, give 10 units of NovoLog. He tried Metformin x2 >> diarrhea. He was on Onglyza >> now stopped while on Trulicity.  Pt's mom was checking his sugars twice a day: - am:  178-310 >> 68, 185-225, 319 >> 107-162 - 2h after b'fast: 123-197 >> 170-203 >> n/c - before lunch:  137-248, 342 >> 180-226 >> 90-156, 181, 220 - 2h after lunch: 192, 243-329 >> n/c >> 181-285 >> 154-253 - before  dinner:  142-294, 316 >> 132 >>  130-167, 206 - after dinner:240-268 >> 188-262, 307 >> 260 >> 170-232 - bedtime:  n/c >> 178-361 >>> 292 >> 333, 362 He has hypoglycemia awareness in the 70s.  Pt's meals are: - Breakfast: skips - Lunch: soup, sandwich, burger, diet Mtn dew - Dinner: grilled chicken, veggies, starch, diet sods - Snacks: PB, cookie  He is developing more and more aspiration with eating and discussed with his neurologist about the possibility of starting the feeding tube.  They did not decide for this yet.  -No CKD, last BUN/creatinine:  Lab Results  Component Value Date   BUN 10 09/06/2016   CREATININE 0.21 (L) 09/06/2016  On lisinopril. - + HL;  last set of lipids: Lab Results  Component Value Date   CHOL 174 09/06/2016   HDL 28.90 (L) 09/06/2016   LDLCALC 60 12/15/2013   LDLDIRECT 95.0 09/06/2016   TRIG (H) 09/06/2016    537.0 Triglyceride is over 400; calculations on Lipids are invalid.   CHOLHDL 6 09/06/2016  02/23/2015: 138/37/306/40  On Crestor 40.  Did not start fenofibrate - last eye exam was in 2018: 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 -No numbness and tingling in his feet. he still has sensation but cannot move.   ROS: Constitutional: no weight gain/no weight loss, no fatigue,  no subjective hyperthermia, no subjective hypothermia Eyes: no blurry vision, no xerophthalmia ENT: no sore throat, no nodules palpated in throat, no dysphagia, no odynophagia, no hoarseness Cardiovascular: no CP/no SOB/no palpitations/+ leg swelling Respiratory: no cough/no SOB/no wheezing Gastrointestinal: no N/no V/no D/no C/no acid reflux Musculoskeletal: no muscle aches/+ joint aches Skin: + Pressure sore on hips, no hair loss Neurological: no tremors/no numbness/no tingling/no dizziness  I reviewed pt's medications, allergies, PMH, social hx, family hx, and changes were documented in the history of present illness. Otherwise, unchanged from my initial visit note.  Past Surgical  History:  Procedure Laterality Date  . muscle bisopy     History   Social History  . Marital Status: single    Spouse Name: N/A    Number of Children: 0   Occupational History  . disabled   Social History Main Topics  . Smoking status: Former Games developer  . Smokeless tobacco: Not on file  . Alcohol Use: No  . Drug Use: No   Current Outpatient Medications on File Prior to Visit  Medication Sig Dispense Refill  . BD PEN NEEDLE NANO U/F 32G X 4 MM MISC USE ONE NEEDLE DAILY WITH LANTUS SOLOSTAR AS DIRECTED 100 each 0  . CANASA 1000 MG suppository Place 1,000 mg rectally at bedtime as needed (for ulcerative proctitis).   1  . Continuous Blood Gluc Receiver (FREESTYLE LIBRE 14 DAY READER) DEVI USE WITH 14 DAYS SENSOR  0  . Continuous Blood Gluc Sensor (FREESTYLE LIBRE 14 DAY SENSOR) MISC 1 each by Does not apply route every 14 (fourteen) days. 2 each 11  . dicyclomine (BENTYL) 10 MG capsule Take by mouth.    . fenofibrate (TRICOR) 145 MG tablet Take 1 tablet (145 mg total) daily by mouth. 90 tablet 3  . fenofibrate micronized (LOFIBRA) 134 MG capsule TAKE 1 CAPSULE BY MOUTH DAILY BEFORE BREAKFAST. 30 capsule 2  . gabapentin (NEURONTIN) 250 MG/5ML solution Take by mouth.    Marland Kitchen glucose blood (ONETOUCH VERIO) test strip Use to test blood sugar 3 times daily as instructed. 300 each 11  . HYDROcodone-acetaminophen (HYCET) 7.5-325 mg/15 ml solution Take 10 mLs by mouth at bedtime.    Marland Kitchen lisinopril (PRINIVIL,ZESTRIL) 10 MG tablet Take 10 mg by mouth daily.  3  . omeprazole (PRILOSEC) 40 MG capsule Take 1 capsule (40 mg total) by mouth daily. 14 capsule 1  . ondansetron (ZOFRAN ODT) 4 MG disintegrating tablet Take 1 tablet (4 mg total) by mouth every 8 (eight) hours as needed for nausea or vomiting. 20 tablet 0  . ONETOUCH DELICA LANCETS 33G MISC Use to test blood sugar 2 times daily as instructed. 100 each 11  . oseltamivir (TAMIFLU) 75 MG capsule Take by mouth.    . potassium chloride 20 MEQ/15ML  (10%) SOLN TAKE 1 TABLESPOONFUL (15 ML) ONCE A DAY ORALLY  6  . rosuvastatin (CRESTOR) 5 MG tablet Take 5 mg by mouth at bedtime.     . saxagliptin HCl (ONGLYZA) 5 MG TABS tablet Take by mouth.    . TOUJEO SOLOSTAR 300 UNIT/ML SOPN INJECT 80 UNITS INTO THE SKIN AT BEDTIME. 14 pen 3  . TRULICITY 1.5 MG/0.5ML SOPN INJECT 1.5 MG INTO THE SKIN ONCE WEEKLY 6 pen 1   No current facility-administered medications on file prior to visit.     Allergies  Allergen Reactions  . Bactrim Ds [Sulfamethoxazole-Trimethoprim] Other (See Comments)    Pill causes gagging  . Carafate [Sucralfate] Other (See Comments)  Gagging  . Latex Other (See Comments)    Extremely irritated, fire engine red skin  . Penicillins Hives    Has patient had a PCN reaction causing immediate rash, facial/tongue/throat swelling, SOB or lightheadedness with hypotension:unknown Has patient had a PCN reaction causing severe rash involving mucus membranes or skin necrosis:unknown Has patient had a PCN reaction that required hospitalization:unknown Has patient had a PCN reaction occurring within the last 10 years:infant reaction If all of the above answers are "NO", then may proceed with Cephalosporin use.   . Clindamycin/Lincomycin Itching, Swelling and Rash   Family History  Problem Relation Age of Onset  . Diabetes Mother   . Colon polyps Mother   . Heart disease Father        CAD  . Diabetes Father    PE: BP (!) 142/82   Pulse 90  There is no height or weight on file to calculate BMI. Wt Readings from Last 3 Encounters:  05/17/16 150 lb (68 kg)  07/25/15 165 lb (74.8 kg)  02/11/14 165 lb (74.8 kg)  Patient cannot be weighed as he is tetraplegic, in a wheelchair  Constitutional: overweight, in NAD, in wheelchairin wheelchair. Moves few muscle groups, but can talk and eat  Eyes: PERRLA, EOMI, no exophthalmos ENT: moist mucous membranes, no thyromegaly, no cervical lymphadenopathy Cardiovascular: + tachycardia,RR,  No MRG, + bilateral leg swelling Respiratory: CTA B Gastrointestinal: abdomen soft, NT, ND, BS+ Musculoskeletal: + Muscle atrophy deformity in bilateral hands, tetraparesis Skin: moist, warm, no rashes Neurological: Tetraparesis  ASSESSMENT: 1. DM2, insulin-dependent, uncontrolled, without long term complications, But with hyperglycemia - We discussed briefly about Afrezza, the inhaled insulin, but I do not think this would be a good option for him. - We did discuss abou alternative regimen with Humalog 50/50 to reduce the number of injections, however, he is eating his first meal of the day between 12 and 2 PM and the last meal of the day around 7 PM, and this would not work well with this type of regimen.  2. HL  PLAN:  1. Patient with long-standing, uncontrolled, type 2 diabetes, on basal-bolus insulin regimen + GLP-1 receptor agonist, with worse sugars at last visit as he was sleeping many Humalog doses before meals.  Since then, his mother started to give him the injections of Humalog in the morning, but he mostly skips the once later in the day.  As a consequence, his sugars increase throughout the day in a stepwise fashion.  We discussed about the importance of giving him the Humalog injections before lunch and especially before dinner, as sugars after dinner are in the 200s and at bedtime in the 300s.  His mother is giving him the injections and she is now more at home with him so she can start giving him the injections consistently.  We discussed that we most likely need to decrease the dose of Toujeo when he starts to take Humalog consistently. - I advised him to: Patient Instructions  Please continue: - Trulicity 1.5 mg in am - Toujeo 80 units at bedtime  Restart: - Humalog: - 22 units with a smaller meal - 25 units with a regular meal  - 28 units with a larger meal If you do not eat b'fast, and sugars are >130, give 10 units of NovoLog.  Please come back for a follow-up  appointment in 3 months.  - today, HbA1c is 8% (better) - continue checking sugars at different times of the day - they check  with the Freestyle libre CGM - advised for yearly eye exams >> he is UTD - Return to clinic in 3 mo with sugar log   2. HL - Reviewed latest lipid panel from 08/2016: Triglycerides quite high - His triglycerides are high at last check so I suggested to start fenofibrate.  He initially did not start because of the large tablet but then also refused when I suggested to try a capsule that he could empty in applesauce.   - He continues on Crestor 40 without side effects - We will need to get his latest lipid panel result from PCP  Carlus Pavlov, MD PhD [facility] Endocrinology

## 2017-06-06 NOTE — Patient Instructions (Addendum)
Please continue: - Trulicity 1.5 mg in am - Toujeo 80 units at bedtime  Restart: - Humalog: - 22 units with a smaller meal - 25 units with a regular meal  - 28 units with a larger meal If you do not eat b'fast, and sugars are >130, give 10 units of NovoLog.  Please come back for a follow-up appointment in 3 months.

## 2017-06-07 ENCOUNTER — Encounter: Payer: Medicare Other | Attending: Physician Assistant | Admitting: Physician Assistant

## 2017-06-07 DIAGNOSIS — Z7984 Long term (current) use of oral hypoglycemic drugs: Secondary | ICD-10-CM | POA: Diagnosis not present

## 2017-06-07 DIAGNOSIS — L89152 Pressure ulcer of sacral region, stage 2: Secondary | ICD-10-CM | POA: Insufficient documentation

## 2017-06-07 DIAGNOSIS — E663 Overweight: Secondary | ICD-10-CM | POA: Insufficient documentation

## 2017-06-07 DIAGNOSIS — G7100 Muscular dystrophy, unspecified: Secondary | ICD-10-CM | POA: Diagnosis not present

## 2017-06-07 DIAGNOSIS — Z8249 Family history of ischemic heart disease and other diseases of the circulatory system: Secondary | ICD-10-CM | POA: Diagnosis not present

## 2017-06-07 DIAGNOSIS — G1220 Motor neuron disease, unspecified: Secondary | ICD-10-CM | POA: Diagnosis not present

## 2017-06-07 DIAGNOSIS — I10 Essential (primary) hypertension: Secondary | ICD-10-CM | POA: Diagnosis not present

## 2017-06-07 DIAGNOSIS — G8250 Quadriplegia, unspecified: Secondary | ICD-10-CM | POA: Insufficient documentation

## 2017-06-07 DIAGNOSIS — E11622 Type 2 diabetes mellitus with other skin ulcer: Secondary | ICD-10-CM | POA: Insufficient documentation

## 2017-06-07 DIAGNOSIS — Z87891 Personal history of nicotine dependence: Secondary | ICD-10-CM | POA: Insufficient documentation

## 2017-06-07 DIAGNOSIS — H547 Unspecified visual loss: Secondary | ICD-10-CM | POA: Diagnosis not present

## 2017-06-09 NOTE — Progress Notes (Signed)
MAGUIRE, SIME ([id number]) Visit Report for 06/07/2017 Abuse/Suicide Risk Screen Details Patient Name: [name], [name] Date of Service: 06/07/2017 9:15 AM Medical Record Number: [id number] Patient Account Number: [id number] Date of Birth/Sex: 1965-09-04 (52 y.o. M) Treating RN: Curtis Sites Primary Care Zelie Asbill: Azucena Cecil, DAVID Other Clinician: Referring Suhayb Anzalone: Referral, Self Treating Chanc Kervin/Extender: STONE III, HOYT Weeks in Treatment: 0 Abuse/Suicide Risk Screen Items Answer ABUSE/SUICIDE RISK SCREEN: Has anyone close to you tried to hurt or harm you recentlyo No Do you feel uncomfortable with anyone in your familyo No Has anyone forced you do things that you didnot want to doo No Do you have any thoughts of harming yourselfo No Patient displays signs or symptoms of abuse and/or neglect. No Electronic Signature(s) Signed: 06/07/2017 4:34:09 PM By: Curtis Sites Entered By: Curtis Sites on 06/07/2017 09:27:56 [name], [name] ([id number]) -------------------------------------------------------------------------------- Activities of Daily Living Details Patient Name: [name], [name] Date of Service: 06/07/2017 9:15 AM Medical Record Number: [id number] Patient Account Number: [id number] Date of Birth/Sex: Mar 29, 1965 (52 y.o. M) Treating RN: Curtis Sites Primary Care Raffi Milstein: Azucena Cecil, DAVID Other Clinician: Referring Laurance Heide: Referral, Self Treating Vondell Babers/Extender: STONE III, HOYT Weeks in Treatment: 0 Activities of Daily Living Items Answer Activities of Daily Living (Please select one for each item) Drive Automobile Not Able Take Medications Need Assistance Use Telephone Need Assistance Care for Appearance Need Assistance Use Toilet Need Assistance Bath / Shower Need Assistance Dress Self Need Assistance Feed Self Need Assistance Walk Not Able Get In / Out Bed Need Assistance Housework Not Able Prepare Meals Not Able Handle Money Need Assistance Shop for Self  Need Assistance Electronic Signature(s) Signed: 06/07/2017 4:34:09 PM By: Curtis Sites Entered By: Curtis Sites on 06/07/2017 09:28:42 [name], [name] ([id number]) -------------------------------------------------------------------------------- Education Assessment Details Patient Name: [name], [name] Date of Service: 06/07/2017 9:15 AM Medical Record Number: [id number] Patient Account Number: [id number] Date of Birth/Sex: 1965/06/26 (52 y.o. M) Treating RN: Curtis Sites Primary Care Annebelle Bostic: Azucena Cecil, DAVID Other Clinician: Referring Kolden Dupee: Referral, Self Treating Lujain Kraszewski/Extender: Linwood Dibbles, HOYT Weeks in Treatment: 0 Primary Learner Assessed: Patient Learning Preferences/Education Level/Primary Language Learning Preference: Explanation, Demonstration Highest Education Level: High School Preferred Language: English Cognitive Barrier Assessment/Beliefs Language Barrier: No Translator Needed: No Memory Deficit: No Emotional Barrier: No Cultural/Religious Beliefs Affecting Medical Care: No Physical Barrier Assessment Impaired Vision: No Impaired Hearing: No Decreased Hand dexterity: No Knowledge/Comprehension Assessment Knowledge Level: Medium Comprehension Level: Medium Ability to understand written Medium instructions: Ability to understand verbal Medium instructions: Motivation Assessment Anxiety Level: Calm Cooperation: Cooperative Education Importance: Acknowledges Need Interest in Health Problems: Asks Questions Perception: Coherent Willingness to Engage in Self- Medium Management Activities: Readiness to Engage in Self- Medium Management Activities: Electronic Signature(s) Signed: 06/07/2017 4:34:09 PM By: Curtis Sites Entered By: Curtis Sites on 06/07/2017 09:29:23 [name], [name] ([id number]) -------------------------------------------------------------------------------- Fall Risk Assessment Details Patient Name: [name], [name] Date of  Service: 06/07/2017 9:15 AM Medical Record Number: [id number] Patient Account Number: [id number] Date of Birth/Sex: 1965-07-01 (52 y.o. M) Treating RN: Curtis Sites Primary Care Dellanira Dillow: Azucena Cecil, DAVID Other Clinician: Referring Deaira Leckey: Referral, Self Treating Amory Zbikowski/Extender: Linwood Dibbles, HOYT Weeks in Treatment: 0 Fall Risk Assessment Items Have you had 2 or more falls in the last 12 monthso 0 No Have you had any fall that resulted in injury in the last 12 monthso 0 No FALL RISK ASSESSMENT: History of falling - immediate or within 3 months 0 No Secondary diagnosis 0 No Ambulatory aid None/bed rest/wheelchair/nurse 0 Yes Crutches/cane/walker 0 No Furniture 0  No IV Access/Saline Lock 0 No Gait/Training Normal/bed rest/immobile 0 No Weak 10 Yes Impaired 0 No Mental Status Oriented to own ability 0 Yes Electronic Signature(s) Signed: 06/07/2017 4:34:09 PM By: Curtis Sites Entered By: Curtis Sites on 06/07/2017 09:29:42 [name], [name] ([id number]) -------------------------------------------------------------------------------- Foot Assessment Details Patient Name: [name], [name] Date of Service: 06/07/2017 9:15 AM Medical Record Number: [id number] Patient Account Number: [id number] Date of Birth/Sex: November 22, 1965 (52 y.o. M) Treating RN: Curtis Sites Primary Care Danahi Reddish: Azucena Cecil, DAVID Other Clinician: Referring Breven Guidroz: Referral, Self Treating Daneesha Quinteros/Extender: STONE III, HOYT Weeks in Treatment: 0 Foot Assessment Items Site Locations + = Sensation present, - = Sensation absent, C = Callus, U = Ulcer R = Redness, W = Warmth, M = Maceration, PU = Pre-ulcerative lesion F = Fissure, S = Swelling, D = Dryness Assessment Right: Left: Other Deformity: No No Prior Foot Ulcer: No No Prior Amputation: No No Charcot Joint: No No Ambulatory Status: Gait: Electronic Signature(s) Signed: 06/07/2017 4:34:09 PM By: Curtis Sites Entered By: Curtis Sites on 06/07/2017  09:56:24 [name], [name] ([id number]) -------------------------------------------------------------------------------- Nutrition Risk Assessment Details Patient Name: [name], [name] Date of Service: 06/07/2017 9:15 AM Medical Record Number: [id number] Patient Account Number: [id number] Date of Birth/Sex: Dec 29, 1965 (52 y.o. M) Treating RN: Curtis Sites Primary Care Chaunice Obie: Azucena Cecil, DAVID Other Clinician: Referring Sovereign Ramiro: Referral, Self Treating Josimar Corning/Extender: STONE III, HOYT Weeks in Treatment: 0 Height (in): 65 Weight (lbs): Body Mass Index (BMI): Nutrition Risk Assessment Items NUTRITION RISK SCREEN: I have an illness or condition that made me change the kind and/or amount of 0 No food I eat I eat fewer than two meals per day 0 No I eat few fruits and vegetables, or milk products 0 No I have three or more drinks of beer, liquor or wine almost every day 0 No I have tooth or mouth problems that make it hard for me to eat 0 No I don't always have enough money to buy the food I need 0 No I eat alone most of the time 0 No I take three or more different prescribed or over-the-counter drugs a day 1 Yes Without wanting to, I have lost or gained 10 pounds in the last six months 0 No I am not always physically able to shop, cook and/or feed myself 0 No Nutrition Protocols Good Risk Protocol 0 No interventions needed Moderate Risk Protocol Electronic Signature(s) Signed: 06/07/2017 4:34:09 PM By: Curtis Sites Entered By: Curtis Sites on 06/07/2017 09:29:50

## 2017-06-11 NOTE — Progress Notes (Signed)
[name], [name] ([id number]) Visit Report for 06/07/2017 Chief Complaint Document Details Patient Name: [name], [name] Date of Service: 06/07/2017 9:15 AM Medical Record Number: [id number] Patient Account Number: [id number] Date of Birth/Sex: 03-23-1965 (52 y.o. M) Treating RN: Phillis Haggis Primary Care Provider: Azucena Cecil, DAVID Other Clinician: Referring Provider: Referral, Self Treating Provider/Extender: Linwood Dibbles, HOYT Weeks in Treatment: 0 Information Obtained from: Patient Chief Complaint Right Hip Ulcer Electronic Signature(s) Signed: 06/08/2017 2:32:11 PM By: Lenda Kelp PA-C Entered By: Lenda Kelp on 06/08/2017 08:53:12 Kawahara, [name] ([id number]) -------------------------------------------------------------------------------- Debridement Details Patient Name: [name], [name] Date of Service: 06/07/2017 9:15 AM Medical Record Number: [id number] Patient Account Number: [id number] Date of Birth/Sex: 1965-04-08 (52 y.o. M) Treating RN: Phillis Haggis Primary Care Provider: Azucena Cecil, DAVID Other Clinician: Referring Provider: Referral, Self Treating Provider/Extender: STONE III, HOYT Weeks in Treatment: 0 Debridement Performed for Wound #4 Right Trochanter Assessment: Performed By: Physician STONE III, HOYT E., PA-C Debridement Type: Debridement Pre-procedure Verification/Time Yes - 10:42 Out Taken: Start Time: 10:42 Pain Control: Lidocaine 4% Topical Solution Total Area Debrided (L x W): 1.1 (cm) x 0.6 (cm) = 0.66 (cm) Tissue and other material Viable, Non-Viable, Slough, Biofilm, Slough debrided: Level: Non-Viable Tissue Debridement Description: Selective/Open Wound Instrument: Curette Bleeding: Minimum Hemostasis Achieved: Pressure End Time: 10:43 Procedural Pain: 0 Post Procedural Pain: 0 Response to Treatment: Procedure was tolerated well Level of Consciousness: Awake and Alert Post Procedure Vitals: Temperature: 98.4 Pulse: 87 Respiratory Rate:  16 Blood Pressure: Systolic Blood Pressure: 136 Diastolic Blood Pressure: 80 Post Debridement Measurements of Total Wound Length: (cm) 1.1 Stage: Category/Stage II Width: (cm) 0.6 Depth: (cm) 0.2 Volume: (cm) 0.104 Character of Wound/Ulcer Post Requires Further Debridement Debridement: Post Procedure Diagnosis Same as Pre-procedure Electronic Signature(s) Signed: 06/08/2017 2:32:11 PM By: Lenda Kelp PA-C Signed: 06/08/2017 4:00:06 PM By: Alejandro Mulling Entered By: Alejandro Mulling on 06/07/2017 10:45:47 [name], [name] ([id number]) Foley, [name] ([id number]) -------------------------------------------------------------------------------- HPI Details Patient Name: [name], [name] Date of Service: 06/07/2017 9:15 AM Medical Record Number: [id number] Patient Account Number: [id number] Date of Birth/Sex: 1965/05/09 (52 y.o. M) Treating RN: Phillis Haggis Primary Care Provider: Azucena Cecil, DAVID Other Clinician: Referring Provider: Referral, Self Treating Provider/Extender: STONE III, HOYT Weeks in Treatment: 0 History of Present Illness Location: pain and ulceration in the area of his left upper thigh and buttock and in the region of his scrotum Quality: Patient reports experiencing Best dull pain to affected area(s). Severity: Patient states wound are getting worse. Duration: Patient has had the wound for < 4 weeks prior to presenting for treatment Timing: Pain in wound is Intermittent (comes and goes Context: The wound would happen gradually Modifying Factors: Other treatment(s) tried include:offloading cushion for his wheelchair and appropriate mattress Associated Signs and Symptoms: Patient reports having:in the sitting position for 10-12 hours Best day in his wheelchair and the rest of the time in bed HPI Description: 52 year old male with Best history of diabetes mellitus type 2 diagnosed in 2003, insulin-dependent, uncontrolled without complications. 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 hemoglobin A1c in January 2017 was 6.3% past medical history significant for diabetes mellitus type 2, GERD, muscular dystrophy and has been Best former smoker. during his last visit to his endocrinologist he was diagnosed with Best possible rash Best injury to his left hip and hence referred to the wound center. The patient has not been Best  smoker for several years and though his swallowing is difficult he still eating food to the best of his ability. he has bladder and bowel control. 07/02/2015 -- the patient had some soreness in the area closer to the perineum on the left thigh and was concerned whether there was any open ulceration. Readmission: 06/07/17 patient presents today for initial evaluation and our clinic regarding Best new wound although he has previously been Best patient here for similar issues. He has muscular dystrophy and secondary to this significant weakness and contractures of his lower extremities which therefore does tend to cause him to have recurrent ulcers on his gluteal, sacral, Ischial location. Right now he actually has Best right trochanter wound which has been present for several weeks and according to his mother who does take care of them and in my pinion does an excellent job this has gotten better but still she was concerned about getting it to completely close so it does not worsened. Patient does have diabetes and his hemoglobin A1c is 8.0 patient did see his primary care provider, Alanson Puls, on 06/06/17 according to the after visit summary nothing was really addressed in regard to the wounds at that time however. Other than diabetes the patient is blind, have hypertension, and muscular dystrophy obviously. Electronic Signature(s) Signed: 06/08/2017 2:32:11 PM By: Lenda Kelp PA-C Entered By: Lenda Kelp on 06/08/2017 09:06:43 [name], [name]  ([id number]) -------------------------------------------------------------------------------- Physical Exam Details Patient Name: [name], [name] Date of Service: 06/07/2017 9:15 AM Medical Record Number: [id number] Patient Account Number: [id number] Date of Birth/Sex: December 05, 1965 (52 y.o. M) Treating RN: Phillis Haggis Primary Care Provider: Azucena Cecil, DAVID Other Clinician: Referring Provider: Referral, Self Treating Provider/Extender: STONE III, HOYT Weeks in Treatment: 0 Constitutional patient is hypertensive.. pulse regular and within target range for patient.Marland Kitchen respirations regular, non-labored and within target range for patient.Marland Kitchen temperature within target range for patient.. Well-nourished and well-hydrated in no acute distress. Eyes conjunctiva clear no eyelid edema noted. pupils equal round and reactive to light and accommodation. Ears, Nose, Mouth, and Throat no gross abnormality of ear auricles or external auditory canals. normal hearing noted during conversation. mucus membranes moist. Respiratory normal breathing without difficulty. clear to auscultation bilaterally. Cardiovascular regular rate and rhythm with normal S1, S2. 2+ dorsalis pedis/posterior tibialis pulses. 2+ pitting edema of the bilateral lower extremities. Gastrointestinal (GI) soft, non-tender, non-distended, +BS. no ventral hernia noted. Musculoskeletal normal gait and posture. no significant deformity or arthritic changes, no loss or range of motion, no clubbing. Psychiatric this patient is able to make decisions and demonstrates good insight into disease process. Alert and Oriented x 3. pleasant and cooperative. Notes Patient's wound over the right trochanter area actually appears to be doing very well on evaluation today. There does not appear to be any evidence of infection and in fact 75% of the wound appears to have Artie healed completely. What is remaining open did not require sharp debridement today  I was able to mechanically debride this with saline and gauze with good result and post debridement the wound did appear to be doing much better with an excellent granular surface. He did not have any significant discomfort. Electronic Signature(s) Signed: 06/08/2017 2:32:11 PM By: Lenda Kelp PA-C Entered By: Lenda Kelp on 06/08/2017 09:05:05 [name], [name] ([id number]) -------------------------------------------------------------------------------- Physician Orders Details Patient Name: [name], [name] Date of Service: 06/07/2017 9:15 AM Medical Record Number: [id number] Patient Account Number: [id number] Date of Birth/Sex: 02-02-1965 (52 y.o. M) Treating RN: Ashok Cordia,  Debi Primary Care Provider: Azucena Cecil, DAVID Other Clinician: Referring Provider: Referral, Self Treating Provider/Extender: STONE III, HOYT Weeks in Treatment: 0 Verbal / Phone Orders: Yes Clinician: Pinkerton, Debi Read Back and Verified: Yes Diagnosis Coding Wound Cleansing Wound #4 Right Trochanter o Clean wound with Normal Saline. o Cleanse wound with mild soap and water o May Shower, gently pat wound dry prior to applying new dressing. Anesthetic (add to Medication List) Wound #4 Right Trochanter o Topical Lidocaine 4% cream applied to wound bed prior to debridement (In Clinic Only). Skin Barriers/Peri-Wound Care Wound #4 Right Trochanter o Skin Prep Primary Wound Dressing Wound #4 Right Trochanter o Silver Alginate Secondary Dressing Wound #4 Right Trochanter o Boardered Foam Dressing Dressing Change Frequency Wound #4 Right Trochanter o Change dressing every other day. Off-Loading Wound #4 Right Trochanter o Turn and reposition every 2 hours Additional Orders / Instructions Wound #4 Right Trochanter o Increase protein intake. Patient Medications Allergies: penicillin Notifications Medication Indication Start End lidocaine DOSE 1 - topical 4 % cream - 1 cream  topical [name], [name] ([id number]) Electronic Signature(s) Signed: 06/08/2017 2:32:11 PM By: Lenda Kelp PA-C Signed: 06/08/2017 4:00:06 PM By: Alejandro Mulling Entered By: Alejandro Mulling on 06/07/2017 10:43:48 Payment, [name] ([id number]) -------------------------------------------------------------------------------- Prescription 06/07/2017 Patient Name: Raimondi, Renae Fickle Best. Provider: Lenda Kelp PA-C Date of Birth: 30-Nov-1965 NPI#: [id number] Sex: Judie Petit DEA#: [id number] Phone #: [phone] License #: Patient Address: [facility] [address] [facility] [place], [place] [address] [address] [place], [place] [address] [id number] Allergies penicillin Reaction: rash Severity: Severe Medication Medication: Route: Strength: Form: lidocaine topical 4% cream Class: TOPICAL LOCAL ANESTHETICS Dose: Frequency / Time: Indication: 1 1 cream topical Number of Refills: Number of Units: 0 Generic Substitution: Start Date: End Date: Administered at Substitution Permitted Facility: Yes Time Administered: Time Discontinued: Note to Pharmacy: Signature(s): Date(s): Electronic Signature(s) Signed: 06/08/2017 2:32:11 PM By: Lenda Kelp PA-C Signed: 06/08/2017 4:00:06 PM By: Sharyon Cable ([id number]) Entered By: Alejandro Mulling on 06/07/2017 10:43:48 Blasdel, [name] ([id number]) --------------------------------------------------------------------------------  Problem List Details Patient Name: Clubb, Julien Best. Date of Service: 06/07/2017 9:15 AM Medical Record Number: [id number] Patient Account Number: [id number] Date of Birth/Sex: 04-Apr-1965 (52 y.o. 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 Date of Service: 06/07/2017 9:15 AM Medical Record Number: [id number] Patient Account Number: [id number] Date of Birth/Sex: 09-26-1965 (52 y.o. M) Treating RN: Phillis Haggis Primary Care Provider: Azucena Cecil, DAVID Other Clinician: Referring Provider: Referral, Self Treating Provider/Extender: STONE III, HOYT Weeks in Treatment: 0 Subjective Chief Complaint Information obtained from Patient Right Hip Ulcer History of Present Illness (HPI) The following HPI elements were documented for the patient's wound: Location: pain and ulceration in the area of his left upper thigh and buttock and in the region of his scrotum Quality: Patient reports experiencing Best dull pain to affected area(s). Severity: Patient states wound  are getting worse. Duration: Patient has had the wound for < 4 weeks prior to presenting for treatment Timing: Pain in wound is Intermittent (comes and goes Context: The wound would happen gradually Modifying Factors: Other treatment(s) tried include:offloading cushion for his wheelchair and appropriate mattress Associated Signs and Symptoms: Patient reports having:in the sitting position for 10-12 hours Best day in his wheelchair and the rest of the time in bed 52 year old male with Best history of diabetes mellitus type 2 diagnosed in 2003, insulin-dependent, uncontrolled  without complications. He also has Best motor neuron disease and is in Best wheelchair since several years and in the last 2 years his swallowing is also worse. hemoglobin A1c in January 2017 was 6.3% past medical history significant for diabetes mellitus type 2, GERD, muscular dystrophy and has been Best former smoker. during his last visit to his endocrinologist he was diagnosed with Best possible rash Best injury to his left hip and hence referred to the wound center. The patient has not been Best smoker for several years and though his swallowing is difficult he still eating food to the best of his ability. he has bladder and bowel control. 07/02/2015 -- the patient had some soreness in the area closer to the perineum on the left thigh and was concerned whether there was any open ulceration. Readmission: 06/07/17 patient presents today for initial evaluation and our clinic regarding Best new wound although he has previously been Best patient here for similar issues. He has muscular dystrophy and secondary to this significant weakness and contractures of his lower extremities which therefore does tend to cause him to have recurrent ulcers on his gluteal, sacral, Ischial location. Right now he actually has Best right trochanter wound which has been present for several weeks and according to his mother who does take care of them and in my pinion does an excellent job this has gotten better but still she was concerned about getting it to completely close so it does not worsened. Patient does have diabetes and his hemoglobin A1c is 8.0 patient did see his primary care provider, Alanson Puls, on 06/06/17 according to the after visit summary nothing was really addressed in regard to the wounds at that time however. Other than diabetes the patient is blind, have hypertension, and muscular dystrophy obviously. Wound History Patient presents with 3 open wounds that have been present for approximately 2 months. Patient has been  treating wounds in the following manner: aquacel. Laboratory tests have not been performed in the last month. Patient reportedly has not tested positive for an antibiotic resistant organism. Patient reportedly has not tested positive for osteomyelitis. Patient reportedly has [place], Delmo Best. ([id number]) not had testing performed to evaluate circulation in the legs. Patient History Information obtained from Patient. Allergies penicillin (Severity: Severe, Reaction: rash) Family History Cancer - Maternal Grandparents, Diabetes - Mother,Father, Heart Disease - Father, Hypertension - Mother,Father, Thyroid Problems - Mother, No family history of Hereditary Spherocytosis, Kidney Disease, Lung Disease, Seizures, Stroke, Tuberculosis. Social History Never smoker, Marital Status - Single, Alcohol Use - Never - HX, Drug Use - No History, Caffeine Use - Daily. Medical History Eyes Denies history of Cataracts, Glaucoma, Optic Neuritis Ear/Nose/Mouth/Throat Denies history of Chronic sinus problems/congestion, Middle ear problems Hematologic/Lymphatic Denies history of Anemia, Hemophilia, Human Immunodeficiency Virus, Lymphedema, Sickle Cell Disease Respiratory Denies history of Aspiration, Asthma, Chronic Obstructive Pulmonary Disease (COPD), Pneumothorax, Sleep Apnea, Tuberculosis Cardiovascular Patient has history of Hypertension Denies history of Angina, Arrhythmia, Congestive Heart Failure, Coronary Artery Disease,  Deep Vein Thrombosis, Hypotension, Myocardial Infarction, Peripheral Arterial Disease, Peripheral Venous Disease, Phlebitis, Vasculitis Gastrointestinal Denies history of Cirrhosis , Colitis, Crohn s, Hepatitis Best, Hepatitis B, Hepatitis C Genitourinary Denies history of End Stage Renal Disease Immunological Denies history of Lupus Erythematosus, Raynaud s, Scleroderma Integumentary (Skin) Patient has history of History of pressure wounds Denies history of History of  Burn Musculoskeletal Denies history of Gout, Rheumatoid Arthritis, Osteoarthritis, Osteomyelitis Neurologic Patient has history of Quadriplegia Denies history of Dementia, Neuropathy, Paraplegia, Seizure Disorder Oncologic Denies history of Received Chemotherapy, Received Radiation Psychiatric Denies history of Anorexia/bulimia, Confinement Anxiety Medical And Surgical History Notes Eyes stigmatism Cardiovascular hypercholesterolemia Gastrointestinal GERD Immunological Muscular Distrophy Musculoskeletal Venson, Jyden Best. ([id number]) Muscular Distrophy, SMA type 3 Review of Systems (ROS) Constitutional Symptoms (General Health) The patient has no complaints or symptoms. Eyes The patient has no complaints or symptoms. Ear/Nose/Mouth/Throat The patient has no complaints or symptoms. Hematologic/Lymphatic The patient has no complaints or symptoms. Respiratory The patient has no complaints or symptoms. Cardiovascular The patient has no complaints or symptoms. Gastrointestinal The patient has no complaints or symptoms. Endocrine Denies complaints or symptoms of Hepatitis, Thyroid disease, Polydypsia (Excessive Thirst). Genitourinary Complains or has symptoms of Incontinence/dribbling. Denies complaints or symptoms of Kidney failure/ Dialysis. Immunological The patient has no complaints or symptoms. Integumentary (Skin) Complains or has symptoms of Wounds. Denies complaints or symptoms of Bleeding or bruising tendency, Breakdown, Swelling. Musculoskeletal Complains or has symptoms of Muscle Weakness. Denies complaints or symptoms of Muscle Pain. Neurologic The patient has no complaints or symptoms. Oncologic The patient has no complaints or symptoms. Psychiatric The patient has no complaints or symptoms. Objective Constitutional patient is hypertensive.. pulse regular and within target range for patient.Marland Kitchen respirations regular, non-labored and within target range for  patient.Marland Kitchen temperature within target range for patient.. Well-nourished and well-hydrated in no acute distress. Vitals Time Taken: 9:42 AM, Temperature: 98.4 F, Pulse: 87 bpm, Respiratory Rate: 16 breaths/min, Blood Pressure: 136/80 mmHg. Eyes conjunctiva clear no eyelid edema noted. pupils equal round and reactive to light and accommodation. Ears, Nose, Mouth, and Throat Bantz, Elazar Best. ([id number]) no gross abnormality of ear auricles or external auditory canals. normal hearing noted during conversation. mucus membranes moist. Respiratory normal breathing without difficulty. clear to auscultation bilaterally. Cardiovascular regular rate and rhythm with normal S1, S2. 2+ dorsalis pedis/posterior tibialis pulses. 2+ pitting edema of the bilateral lower extremities. Gastrointestinal (GI) soft, non-tender, non-distended, +BS. no ventral hernia noted. Musculoskeletal normal gait and posture. no significant deformity or arthritic changes, no loss or range of motion, no clubbing. Psychiatric this patient is able to make decisions and demonstrates good insight into disease process. Alert and Oriented x 3. pleasant and cooperative. General Notes: Patient's wound over the right trochanter area actually appears to be doing very well on evaluation today. There does not appear to be any evidence of infection and in fact 75% of the wound appears to have Artie healed completely. What is remaining open did not require sharp debridement today I was able to mechanically debride this with saline and gauze with good result and post debridement the wound did appear to be doing much better with an excellent granular surface. He did not have any significant discomfort. Integumentary (Hair, Skin) Wound #4 status is Open. Original cause of wound was Pressure Injury. The wound is located on the Right Trochanter. The wound measures 1.1cm length x 0.6cm width x 0.1cm depth; 0.518cm^2 area and 0.052cm^3 volume.  There is Fat Layer (Subcutaneous Tissue) Exposed exposed.  There is no tunneling or undermining noted. There is Best medium amount of serous drainage noted. The wound margin is flat and intact. There is medium (34-66%) red granulation within the wound bed. There is Best medium (34-66%) amount of necrotic tissue within the wound bed including Adherent Slough. The periwound skin appearance exhibited: Scarring, Ecchymosis. The periwound skin appearance did not exhibit: Callus, Crepitus, Excoriation, Induration, Rash, Dry/Scaly, Maceration, Atrophie Blanche, Cyanosis, Hemosiderin Staining, Mottled, Pallor, Rubor, Erythema. Periwound temperature was noted as No Abnormality. Assessment Active Problems ICD-10 G12.20 - Motor neuron disease, unspecified E11.622 - Type 2 diabetes mellitus with other skin ulcer E66.3 - Overweight L89.212 - Pressure ulcer of right hip, stage 2 Procedures Wound #4 Seitzinger, Bon Best. ([id number]) Pre-procedure diagnosis of Wound #4 is Best Pressure Ulcer located on the Right Trochanter . There was Best Selective/Open Wound Non-Viable Tissue Debridement with Best total area of 0.66 sq cm performed by STONE III, HOYT E., PA-C. With the following instrument(s): Curette to remove Viable and Non-Viable tissue/material. Material removed includes Slough and Biofilm and after achieving pain control using Lidocaine 4% Topical Solution. No specimens were taken. Best time out was conducted at 10:42, prior to the start of the procedure. Best Minimum amount of bleeding was controlled with Pressure. The procedure was tolerated well with Best pain level of 0 throughout and Best pain level of 0 following the procedure. Patient s Level of Consciousness post procedure was recorded as Awake and Alert and post-procedure vitals were taken including Temperature: 98.4 F, Pulse: 87 bpm, Respiratory Rate: 16 breaths/min, Blood Pressure: (136)/(80) mmHg. Post Debridement Measurements: 1.1cm length x 0.6cm width x 0.2cm depth;  0.104cm^3 volume. Post debridement Stage noted as Category/Stage II. Character of Wound/Ulcer Post Debridement requires further debridement. Post procedure Diagnosis Wound #4: Same as Pre-Procedure Plan Wound Cleansing: Wound #4 Right Trochanter: Clean wound with Normal Saline. Cleanse wound with mild soap and water May Shower, gently pat wound dry prior to applying new dressing. Anesthetic (add to Medication List): Wound #4 Right Trochanter: Topical Lidocaine 4% cream applied to wound bed prior to debridement (In Clinic Only). Skin Barriers/Peri-Wound Care: Wound #4 Right Trochanter: Skin Prep Primary Wound Dressing: Wound #4 Right Trochanter: Silver Alginate Secondary Dressing: Wound #4 Right Trochanter: Boardered Foam Dressing Dressing Change Frequency: Wound #4 Right Trochanter: Change dressing every other day. Off-Loading: Wound #4 Right Trochanter: Turn and reposition every 2 hours Additional Orders / Instructions: Wound #4 Right Trochanter: Increase protein intake. The following medication(s) was prescribed: lidocaine topical 4 % cream 1 1 cream topical was prescribed at facility Currently I'm gonna suggest that we continue to treat this wound I think silver collagen would be an appropriate dressing at this time and we will cover this with Best Boarder Foam Dressing in order to prevent any issues with additional pressure getting to the site. Patient is in agreement with this plan. His mother he does care for him and changes the dressings as well think this sounds like Best great idea. They previously have used the collagen although it's been Best while. We will see were things stand in two weeks when we see him for reevaluation. If anything worsens in the interim they will contact our office for additional recommendations. Otherwise hopefully he will be doing much better. Please see above for specific wound care orders. YAMIN, SWINGLER ([id number]) Electronic  Signature(s) Signed: 06/08/2017 2:32:11 PM By: Lenda Kelp PA-C Entered By: Lenda Kelp on 06/08/2017 09:06:56 [name], [name] ([id number]) -------------------------------------------------------------------------------- ROS/PFSH Details  Patient Name: Heinecke, JSHON IBE. Date of Service: 06/07/2017 9:15 AM Medical Record Number: [id number] Patient Account Number: [id number] Date of Birth/Sex: [birth year] (52 y.o. M) Treating RN: Curtis Sites Primary Care Provider: Azucena Cecil, DAVID Other Clinician: Referring Provider: Referral, Self Treating Provider/Extender: STONE III, HOYT Weeks in Treatment: 0 Information Obtained From Patient Wound History Do you currently have one or more open woundso Yes How many open wounds do you currently haveo 3 Approximately how long have you had your woundso 2 months How have you been treating your wound(s) until nowo aquacel Has your wound(s) ever healed and then re-openedo No Have you had any lab work done in the past montho No Have you tested positive for an antibiotic resistant organism (MRSA, VRE)o No Have you tested positive for osteomyelitis (bone infection)o No Have you had any tests for circulation on your legso No Constitutional Symptoms (General Health) Complaints and Symptoms: No Complaints or Symptoms Complaints and Symptoms: Negative for: Fatigue; Fever; Chills; Marked Weight Change Eyes Complaints and Symptoms: No Complaints or Symptoms Complaints and Symptoms: Negative for: Dry Eyes; Vision Changes; Glasses / Contacts Medical History: Negative for: Cataracts; Glaucoma; Optic Neuritis Past Medical History Notes: stigmatism Ear/Nose/Mouth/Throat Complaints and Symptoms: No Complaints or Symptoms Complaints and Symptoms: Negative for: Difficult clearing ears; Sinusitis Medical History: Negative for: Chronic sinus problems/congestion; Middle ear problems Hematologic/Lymphatic Complaints and Symptoms: No Complaints or  Symptoms [name], [name] ([id number]) Complaints and Symptoms: Negative for: Bleeding / Clotting Disorders; Human Immunodeficiency Virus Medical History: Negative for: Anemia; Hemophilia; Human Immunodeficiency Virus; Lymphedema; Sickle Cell Disease Respiratory Complaints and Symptoms: No Complaints or Symptoms Complaints and Symptoms: Negative for: Chronic or frequent coughs; Shortness of Breath Medical History: Negative for: Aspiration; Asthma; Chronic Obstructive Pulmonary Disease (COPD); Pneumothorax; Sleep Apnea; Tuberculosis Gastrointestinal Complaints and Symptoms: No Complaints or Symptoms Complaints and Symptoms: Negative for: Frequent diarrhea; Nausea; Vomiting Medical History: Negative for: Cirrhosis ; Colitis; Crohnos; Hepatitis Best; Hepatitis B; Hepatitis C Past Medical History Notes: GERD Endocrine Complaints and Symptoms: Negative for: Hepatitis; Thyroid disease; Polydypsia (Excessive Thirst) Medical History: Positive for: Type II Diabetes Time with diabetes: 2005 Treated with: Insulin, Oral agents Blood sugar tested every day: No Genitourinary Complaints and Symptoms: Positive for: Incontinence/dribbling Negative for: Kidney failure/ Dialysis Medical History: Negative for: End Stage Renal Disease Immunological Complaints and Symptoms: No Complaints or Symptoms Complaints and Symptoms: Negative for: Hives; Itching Current, RYZEN DEADY ([id number]) Medical History: Negative for: Lupus Erythematosus; Raynaudos; Scleroderma Past Medical History Notes: Muscular Distrophy Integumentary (Skin) Complaints and Symptoms: Positive for: Wounds Negative for: Bleeding or bruising tendency; Breakdown; Swelling Medical History: Positive for: History of pressure wounds Negative for: History of Burn Musculoskeletal Complaints and Symptoms: Positive for: Muscle Weakness Negative for: Muscle Pain Medical History: Negative for: Gout; Rheumatoid Arthritis;  Osteoarthritis; Osteomyelitis Past Medical History Notes: Muscular Distrophy, SMA type 3 Neurologic Complaints and Symptoms: No Complaints or Symptoms Complaints and Symptoms: Negative for: Numbness/parasthesias; Focal/Weakness Medical History: Positive for: Quadriplegia Negative for: Dementia; Neuropathy; Paraplegia; Seizure Disorder Psychiatric Complaints and Symptoms: No Complaints or Symptoms Complaints and Symptoms: Negative for: Anxiety; Claustrophobia Medical History: Negative for: Anorexia/bulimia; Confinement Anxiety Cardiovascular Complaints and Symptoms: No Complaints or Symptoms Medical History: Positive for: Hypertension Negative for: Angina; Arrhythmia; Congestive Heart Failure; Coronary Artery Disease; Deep Vein Thrombosis; Hypotension; Myocardial Infarction; Peripheral Arterial Disease; Peripheral Venous Disease; Phlebitis; Vasculitis Past Medical History Notes: Sheldon, ZARIAH JOST ([id number]) hypercholesterolemia Oncologic Complaints and Symptoms: No Complaints or Symptoms Medical History: Negative for: Received Chemotherapy; Received Radiation Immunizations Pneumococcal Vaccine:  Received Pneumococcal Vaccination: Yes Implantable Devices Family and Social History Cancer: Yes - Maternal Grandparents; Diabetes: Yes - Mother,Father; Heart Disease: Yes - Father; Hereditary Spherocytosis: No; Hypertension: Yes - Mother,Father; Kidney Disease: No; Lung Disease: No; Seizures: No; Stroke: No; Thyroid Problems: Yes - Mother; Tuberculosis: No; Never smoker; Marital Status - Single; Alcohol Use: Never - HX; Drug Use: No History; Caffeine Use: Daily; Financial Concerns: No; Food, Clothing or Shelter Needs: No; Support System Lacking: No; Transportation Concerns: No; Advanced Directives: No; Patient does not want information on Advanced Directives; Do not resuscitate: No; Living Will: No; Medical Power of Attorney: No Electronic Signature(s) Signed: 06/07/2017 4:34:09 PM  By: Curtis Sites Signed: 06/08/2017 2:32:11 PM By: Lenda Kelp PA-C Entered By: Curtis Sites on 06/07/2017 09:46:56 [name], [name] ([id number]) -------------------------------------------------------------------------------- SuperBill Details Patient Name: Wooding, Oluwatimileyin Best. Date of Service: 06/07/2017 Medical Record Number: [id number] Patient Account Number: [id number] Date of Birth/Sex: 11/16/1965 (52 y.o. 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, unspecified E11.622 Type 2 diabetes mellitus with other skin ulcer E66.3 Overweight L89.212 Pressure ulcer of right hip, stage 2 Facility Procedures CPT4 Code: [id number] Description: 99213 - WOUND CARE VISIT-LEV 3 EST PT Modifier: Quantity: 1 CPT4 Code: [id number] Description: 97597 - DEBRIDE WOUND 1ST 20 SQ CM OR < ICD-10 Diagnosis Description L89.212 Pressure ulcer of right hip, stage 2 Modifier: Quantity: 1 Physician Procedures CPT4 Code: [id number] Description: 99214 - WC PHYS LEVEL 4 - EST PT ICD-10 Diagnosis Description 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

## 2017-06-11 NOTE — Progress Notes (Signed)
TADAN, SHILL ([id number]) Visit Report for 06/07/2017 Allergy List Details Patient Name: [name], [name] Date of Service: 06/07/2017 9:15 AM Medical Record Number: [id number] Patient Account Number: [id number] Date of Birth/Sex: 10-17-1965 (52 y.o. M) Treating RN: [name] Primary Care [name]: [name] Other Clinician: Referring Kallum Jorgensen: Referral, Self Treating [name]/Extender: STONE Best, [name] Weeks in Treatment: 0 Allergies Active Allergies penicillin Reaction: rash Severity: Severe Allergy Notes Electronic Signature(s) Signed: 06/07/2017 4:34:09 PM By: [name] Entered By: [name] on 06/07/2017 09:27:45 [name], [name] ([id number]) -------------------------------------------------------------------------------- Arrival Information Details Patient Name: [name], [name] Date of Service: 06/07/2017 9:15 AM Medical Record Number: [id number] Patient Account Number: [id number] Date of Birth/Sex: 10-27-1965 (52 y.o. M) Treating RN: [name] Primary Care Cailean Heacock: [name] Other Clinician: Referring [name]: Referral, Self Treating [name]/Extender: [name], [name] Weeks in Treatment: 0 Visit Information Patient Arrived: Wheel Chair Arrival Time: 09:26 Accompanied By: mother Transfer Assistance: Nurse, adult Patient Identification Verified: Yes Secondary Verification Process Completed: Yes Patient Has Alerts: Yes Patient Alerts: DMII History Since Last Visit Added or deleted any medications: No Any new allergies or adverse reactions: No Had a fall or experienced change in activities of daily living that may affect risk of falls: No Signs or symptoms of abuse/neglect since last visito No Hospitalized since last visit: No Implantable device outside of the clinic excluding cellular tissue based products placed in the center since last visit: No Has Dressing in Place as Prescribed: Yes Electronic Signature(s) Signed: 06/07/2017 4:34:09 PM By:  [name] Entered By: [name] on 06/07/2017 09:26:45 [name], [name] ([id number]) -------------------------------------------------------------------------------- Clinic Level of Care Assessment Details Patient Name: [name], [name] Date of Service: 06/07/2017 9:15 AM Medical Record Number: [id number] Patient Account Number: [id number] Date of Birth/Sex: October 02, 1965 (52 y.o. M) Treating RN: [name] Primary Care [name]: [name] Other Clinician: Referring Moesha Sarchet: Referral, Self Treating Alandra Sando/Extender: STONE Best, [name] Weeks in Treatment: 0 Clinic Level of Care Assessment Items TOOL 1 Quantity Score X - Use when EandM and Procedure is performed on INITIAL visit 1 0 ASSESSMENTS - Nursing Assessment / Reassessment X - General Physical Exam (combine w/ comprehensive assessment (listed just below) when 1 20 performed on new pt. evals) X- 1 25 Comprehensive Assessment (HX, ROS, Risk Assessments, Wounds Hx, etc.) ASSESSMENTS - Wound and Skin Assessment / Reassessment  - Dermatologic / Skin Assessment (not related to wound area) 0 ASSESSMENTS - Ostomy and/or Continence Assessment and Care  - Incontinence Assessment and Management 0  - 0 Ostomy Care Assessment and Management (repouching, etc.) PROCESS - Coordination of Care X - Simple Patient / Family Education for ongoing care 1 15  - 0 Complex (extensive) Patient / Family Education for ongoing care  - 0 Staff obtains Chiropractor, Records, Test Results / Process Orders  - 0 Staff telephones HHA, Nursing Homes / Clarify orders / etc  - 0 Routine Transfer to another Facility (non-emergent condition)  - 0 Routine Hospital Admission (non-emergent condition) X- 1 15 New Admissions / Manufacturing engineer / Ordering NPWT, Apligraf, etc.  - 0 Emergency Hospital Admission (emergent condition) PROCESS - Special Needs  - Pediatric / Minor Patient Management 0  - 0 Isolation Patient  Management  - 0 Hearing / Language / Visual special needs  - 0 Assessment of Community assistance (transportation, D/C planning, etc.)  - 0 Additional assistance / Altered mentation  - 0 Support Surface(s) Assessment (bed, cushion, seat, etc.) [name], [name] ([id number]) INTERVENTIONS - Miscellaneous  - External ear exam  0 X- 1 10 Patient Transfer (multiple staff / Nurse, adult / Similar devices)  - 0 Simple Staple / Suture removal (25 or less)  - 0 Complex Staple / Suture removal (26 or more)  - 0 Hypo/Hyperglycemic Management (do not check if billed separately)  - 0 Ankle / Brachial Index (ABI) - do not check if billed separately Has the patient been seen at the hospital within the last three years: Yes Total Score: 85 Level Of Care: New/Established - Level 3 Electronic Signature(s) Signed: 06/08/2017 4:00:06 PM By: [name] Entered By: [name] on 06/07/2017 11:19:44 [name], [name] ([id number]) -------------------------------------------------------------------------------- Encounter Discharge Information Details Patient Name: [name], [name] Date of Service: 06/07/2017 9:15 AM Medical Record Number: [id number] Patient Account Number: [id number] Date of Birth/Sex: Feb 24, 1965 (52 y.o. M) Treating RN: [name] Primary Care Brylei Pedley: [name] Other Clinician: Referring Walaa Carel: Referral, Self Treating [name]/Extender: [name], [name] Weeks in Treatment: 0 Encounter Discharge Information Items Discharge Condition: Stable Ambulatory Status: Wheelchair Discharge Destination: Home Transportation: Private Auto Accompanied By: mom Schedule Follow-up Appointment: Yes Clinical Summary of Care: Electronic Signature(s) Signed: 06/07/2017 5:11:28 PM By: [name] Entered By: [name] on 06/07/2017 11:02:31 [name], [name]  ([id number]) -------------------------------------------------------------------------------- Lower Extremity Assessment Details Patient Name: [name], [name] Date of Service: 06/07/2017 9:15 AM Medical Record Number: [id number] Patient Account Number: [id number] Date of Birth/Sex: November 09, 1965 (52 y.o. M) Treating RN: [name] Primary Care Katonya Blecher: [name] Other Clinician: Referring Hondo Nanda: Referral, Self Treating Karissa Meenan/Extender: [name], [name] Weeks in Treatment: 0 Electronic Signature(s) Signed: 06/07/2017 4:34:09 PM By: [name] Entered By: [name] on 06/07/2017 09:56:02 [name], [name] ([id number]) -------------------------------------------------------------------------------- Multi Wound Chart Details Patient Name: [name], [name] Date of Service: 06/07/2017 9:15 AM Medical Record Number: [id number] Patient Account Number: [id number] Date of Birth/Sex: [birth year] (52 y.o. M) Treating RN: [name] Primary Care Alaynna Kerwood: [name] Other Clinician: Referring Joshaua Epple: Referral, Self Treating Ambreen Tufte/Extender: STONE Best, [name] Weeks in Treatment: 0 Vital Signs Height(in): Pulse(bpm): 87 Weight(lbs): Blood Pressure(mmHg): 136/80 Body Mass Index(BMI): Temperature(F): 98.4 Respiratory Rate 16 (breaths/min): Photos: [N/A:N/A] Wound Location: Right Trochanter N/A N/A Wounding Event: Pressure Injury N/A N/A Primary Etiology: Pressure Ulcer N/A N/A Comorbid History: Hypertension, Type II N/A N/A Diabetes, History of pressure wounds, Quadriplegia Date Acquired: 04/09/2017 N/A N/A Weeks of Treatment: 0 N/A N/A Wound Status: Open N/A N/A Measurements L x W x D 1.1x0.6x0.1 N/A N/A (cm) Area (cm) : 0.518 N/A N/A Volume (cm) : 0.052 N/A N/A Classification: Category/Stage II N/A N/A Exudate Amount: Medium N/A N/A Exudate Type: Serous N/A N/A Exudate Color: amber N/A N/A Wound Margin: Flat and Intact N/A N/A Granulation Amount: Medium  (34-66%) N/A N/A Granulation Quality: Red N/A N/A Necrotic Amount: Medium (34-66%) N/A N/A Exposed Structures: Fat Layer (Subcutaneous N/A N/A Tissue) Exposed: Yes Fascia: No Tendon: No Muscle: No Joint: No Bone: No Epithelialization: Medium (34-66%) N/A N/A Udall, Taft A. ([id number]) Periwound Skin Texture: Scarring: Yes N/A N/A Excoriation: No Induration: No Callus: No Crepitus: No Rash: No Periwound Skin Moisture: Maceration: No N/A N/A Dry/Scaly: No Periwound Skin Color: Ecchymosis: Yes N/A N/A Atrophie Blanche: No Cyanosis: No Erythema: No Hemosiderin Staining: No Mottled: No Pallor: No Rubor: No Temperature: No Abnormality N/A N/A Tenderness on Palpation: No N/A N/A Wound Preparation: Ulcer Cleansing: N/A N/A Rinsed/Irrigated with Saline Topical Anesthetic Applied: Other: lidocaine 4% Treatment Notes Electronic Signature(s) Signed: 06/08/2017 4:00:06 PM By: [name] Entered By: [name] on 06/07/2017 10:39:35 Mclaurin, [name] ([id number]) --------------------------------------------------------------------------------  Multi-Disciplinary Care Plan Details Patient Name: [name], [name] Date of Service: 06/07/2017 9:15 AM Medical Record Number: [id number] Patient Account Number: [id number] Date of Birth/Sex: May 10, 1965 (52 y.o. M) Treating RN: [name] Primary Care Jelesa Mangini: [name] Other Clinician: Referring Eddith Mentor: Referral, Self Treating Abdelaziz Westenberger/Extender: STONE Best, [name] Weeks in Treatment: 0 Active Inactive ` Abuse / Safety / Falls / Self Care Management Nursing Diagnoses: Potential for falls Goals: Patient will not experience any injury related to falls Date Initiated: 06/07/2017 Target Resolution Date: 10/06/2017 Goal Status: Active Interventions: Assess Activities of Daily Living upon admission and as needed Assess fall risk on admission and as needed Assess: immobility, friction, shearing, incontinence upon  admission and as needed Assess impairment of mobility on admission and as needed per policy Assess personal safety and home safety (as indicated) on admission and as needed Notes: ` Nutrition Nursing Diagnoses: Imbalanced nutrition Impaired glucose control: actual or potential Potential for alteratiion in Nutrition/Potential for imbalanced nutrition Goals: Patient/caregiver agrees to and verbalizes understanding of need to use nutritional supplements and/or vitamins as prescribed Date Initiated: 06/07/2017 Target Resolution Date: 09/08/2017 Goal Status: Active Patient/caregiver will maintain therapeutic glucose control Date Initiated: 06/07/2017 Target Resolution Date: 10/06/2017 Goal Status: Active Interventions: Assess patient nutrition upon admission and as needed per policy Provide education on elevated blood sugars and impact on wound healing Notes: ` [name], [name] ([id number]) Orientation to the Wound Care Program Nursing Diagnoses: Knowledge deficit related to the wound healing center program Goals: Patient/caregiver will verbalize understanding of the Wound Healing Center Program Date Initiated: 06/07/2017 Target Resolution Date: 07/07/2017 Goal Status: Active Interventions: Provide education on orientation to the wound center Notes: ` Pressure Nursing Diagnoses: Knowledge deficit related to causes and risk factors for pressure ulcer development Knowledge deficit related to management of pressures ulcers Potential for impaired tissue integrity related to pressure, friction, moisture, and shear Goals: Patient will remain free from development of additional pressure ulcers Date Initiated: 06/07/2017 Target Resolution Date: 09/08/2017 Goal Status: Active Interventions: Assess: immobility, friction, shearing, incontinence upon admission and as needed Assess offloading mechanisms upon admission and as needed Assess potential for pressure ulcer upon admission and as  needed Provide education on pressure ulcers Notes: ` Wound/Skin Impairment Nursing Diagnoses: Impaired tissue integrity Knowledge deficit related to ulceration/compromised skin integrity Goals: Ulcer/skin breakdown will have a volume reduction of 80% by week 12 Date Initiated: 06/07/2017 Target Resolution Date: 09/29/2017 Goal Status: Active Interventions: Assess patient/caregiver ability to perform ulcer/skin care regimen upon admission and as needed Assess ulceration(s) every visit Notes: [name], SUNDT ([id number]) Electronic Signature(s) Signed: 06/08/2017 4:00:06 PM By: [name] Entered By: [name] on 06/07/2017 10:39:21 Whitehorn, [name] ([id number]) -------------------------------------------------------------------------------- Pain Assessment Details Patient Name: Bells, Armistead A. Date of Service: 06/07/2017 9:15 AM Medical Record Number: [id number] Patient Account Number: [id number] Date of Birth/Sex: 25-Aug-1965 (52 y.o. M) Treating RN: [name] Primary Care Dakhari Zuver: [name] Other Clinician: Referring Norinne Jeane: Referral, Self Treating Aneth Schlagel/Extender: STONE Best, [name] Weeks in Treatment: 0 Active Problems Location of Pain Severity and Description of Pain Patient Has Paino Yes Site Locations Pain Location: Pain in Ulcers With Dressing Change: Yes Pain Management and Medication Current Pain Management: Electronic Signature(s) Signed: 06/07/2017 4:34:09 PM By: [name] Entered By: [name] on 06/07/2017 09:27:22 Younts, [name] ([id number]) -------------------------------------------------------------------------------- Patient/Caregiver Education Details Patient Name: Paster, Chadric A. Date of Service: 06/07/2017 9:15 AM Medical Record Number: [id number] Patient Account Number: [id number] Date of Birth/Gender: [birth year] (52 y.o. M) Treating RN: [name] Primary  Care Physician: [name] Other Clinician: Referring  Physician: Referral, Self Treating Physician/Extender: [name], [name] Weeks in Treatment: 0 Education Assessment Education Provided To: Patient and Caregiver mom Education Topics Provided Welcome To The Wound Care Center: Handouts: Welcome To The Wound Care Center Methods: Explain/Verbal Responses: State content correctly Wound Debridement: Handouts: Wound Debridement Methods: Explain/Verbal Responses: State content correctly Wound/Skin Impairment: Handouts: Caring for Your Ulcer Methods: Explain/Verbal Responses: State content correctly Electronic Signature(s) Signed: 06/07/2017 5:11:28 PM By: [name] Entered By: [name] on 06/07/2017 11:03:10 Seefeld, [name] ([id number]) -------------------------------------------------------------------------------- Wound Assessment Details Patient Name: Grindle, Rushton A. Date of Service: 06/07/2017 9:15 AM Medical Record Number: [id number] Patient Account Number: [id number] Date of Birth/Sex: 06-Jun-1965 (52 y.o. M) Treating RN: [name] Primary Care Brieana Shimmin: [name] Other Clinician: Referring Dolce Sylvia: Referral, Self Treating Zilpha Mcandrew/Extender: STONE Best, [name] Weeks in Treatment: 0 Wound Status Wound Number: 4 Primary Pressure Ulcer Etiology: Wound Location: Right Trochanter Wound Open Wounding Event: Pressure Injury Status: Date Acquired: 04/09/2017 Comorbid Hypertension, Type II Diabetes, History of Weeks Of Treatment: 0 History: pressure wounds, Quadriplegia Clustered Wound: No Photos Photo Uploaded By: [name] on 06/07/2017 10:31:07 Wound Measurements Length: (cm) 1.1 Width: (cm) 0.6 Depth: (cm) 0.1 Area: (cm) 0.518 Volume: (cm) 0.052 % Reduction in Area: % Reduction in Volume: Epithelialization: Medium (34-66%) Tunneling: No Undermining: No Wound Description Classification: Category/Stage II Wound Margin: Flat and Intact Exudate Amount: Medium Exudate Type: Serous Exudate  Color: amber Foul Odor After Cleansing: No Slough/Fibrino Yes Wound Bed Granulation Amount: Medium (34-66%) Exposed Structure Granulation Quality: Red Fascia Exposed: No Necrotic Amount: Medium (34-66%) Fat Layer (Subcutaneous Tissue) Exposed: Yes Necrotic Quality: Adherent Slough Tendon Exposed: No Muscle Exposed: No Joint Exposed: No Bone Exposed: No Periwound Skin Texture Sowash, Ahman A. ([id number]) Texture Color No Abnormalities Noted: No No Abnormalities Noted: No Callus: No Atrophie Blanche: No Crepitus: No Cyanosis: No Excoriation: No Ecchymosis: Yes Induration: No Erythema: No Rash: No Hemosiderin Staining: No Scarring: Yes Mottled: No Pallor: No Moisture Rubor: No No Abnormalities Noted: No Dry / Scaly: No Temperature / Pain Maceration: No Temperature: No Abnormality Wound Preparation Ulcer Cleansing: Rinsed/Irrigated with Saline Topical Anesthetic Applied: Other: lidocaine 4%, Treatment Notes Wound #4 (Right Trochanter) 1. Cleansed with: Clean wound with Normal Saline 2. Anesthetic Topical Lidocaine 4% cream to wound bed prior to debridement 3. Peri-wound Care: Skin Prep 4. Dressing Applied: Prisma Ag 5. Secondary Dressing Applied Bordered Foam Dressing Notes boarder foam dressing applied to area distal to wound to protect new healed skin area Electronic Signature(s) Signed: 06/07/2017 4:34:09 PM By: [name] Entered By: [name] on 06/07/2017 09:55:52 Buster, [name] ([id number]) -------------------------------------------------------------------------------- Vitals Details Patient Name: Nilsson, Danne A. Date of Service: 06/07/2017 9:15 AM Medical Record Number: [id number] Patient Account Number: [id number] Date of Birth/Sex: 05/30/1965 (52 y.o. M) Treating RN: [name] Primary Care Soul Hackman: [name] Other Clinician: Referring Shunte Senseney: Referral, Self Treating Kingsten Enfield/Extender: STONE Best, [name] Weeks in Treatment:  0 Vital Signs Time Taken: 09:42 Temperature (F): 98.4 Pulse (bpm): 87 Respiratory Rate (breaths/min): 16 Blood Pressure (mmHg): 136/80 Reference Range: 80 - 120 mg / dl Electronic Signature(s) Signed: 06/07/2017 4:34:09 PM By: [name] Entered By: [name] on 06/07/2017 09:42:45

## 2017-06-12 ENCOUNTER — Other Ambulatory Visit: Payer: Self-pay | Admitting: Internal Medicine

## 2017-06-21 ENCOUNTER — Encounter: Payer: Medicare Other | Admitting: Nurse Practitioner

## 2017-06-21 DIAGNOSIS — E11622 Type 2 diabetes mellitus with other skin ulcer: Secondary | ICD-10-CM | POA: Diagnosis not present

## 2017-06-23 NOTE — Progress Notes (Addendum)
[name], DENOMME ([id number]) Visit Report for 06/21/2017 Chief Complaint Document Details Patient Name: [name], [name] Date of Service: 06/21/2017 2:00 PM Medical Record Number: [id number] Patient Account Number: [id number] Date of Birth/Sex: [birth year] (52 y.o. M) Treating RN: [name] Primary Care Provider: Azucena Cecil, [name] Other Clinician: Referring Provider: Azucena Cecil, [name] Treating Provider/Extender: [name] in Treatment: 2 Information Obtained from: Patient Chief Complaint Right Hip Ulcer Electronic Signature(s) Signed: 06/21/2017 3:07:55 PM By: [name] Entered By: [name] on 06/21/2017 15:07:54 [name] ([id number]) -------------------------------------------------------------------------------- Debridement Details Patient Name: [name], [name] Date of Service: 06/21/2017 2:00 PM Medical Record Number: [id number] Patient Account Number: [id number] Date of Birth/Sex: 10-21-1965 (52 y.o. M) Treating RN: [name] Primary Care Provider: Azucena Cecil, [name] Other Clinician: Referring Provider: Azucena Cecil, [name] Treating Provider/Extender: [name] in Treatment: 2 Debridement Performed for Wound #4 Right Trochanter Assessment: Performed By: Physician [name] Public, NP Debridement Type: Debridement Pre-procedure Verification/Time Yes - 14:46 Out Taken: Start Time: 14:46 Pain Control: Other : lidocaine 4% Total Area Debrided (L x W): 1.1 (cm) x 0.5 (cm) = 0.55 (cm) Tissue and other material Viable, Non-Viable, Slough, Subcutaneous, Slough debrided: Level: Skin/Subcutaneous Tissue Debridement Description: Excisional Instrument: Curette Bleeding: Minimum Hemostasis Achieved: Pressure End Time: 14:47 Procedural Pain: 0 Post Procedural Pain: 0 Response to Treatment: Procedure was tolerated well Level of Consciousness: Awake and Alert Post Procedure Vitals: Temperature: 98.4 Pulse: 98 Respiratory Rate: 16 Blood  Pressure: Systolic Blood Pressure: 146 Diastolic Blood Pressure: 76 Post Debridement Measurements of Total Wound Length: (cm) 1.1 Stage: Category/Stage II Width: (cm) 0.5 Depth: (cm) 0.1 Volume: (cm) 0.043 Character of Wound/Ulcer Post Stable Debridement: Post Procedure Diagnosis Same as Pre-procedure Electronic Signature(s) Signed: 06/26/2017 5:16:34 PM By: [name] Signed: 06/26/2017 9:20:58 PM By: [name] Previous Signature: 06/21/2017 4:33:19 PM Version By: [name] Previous Signature: 06/22/2017 8:17:02 AM Version By: [name] ([id number]) Entered By: [name] on 06/26/2017 12:10:47 [name], [name] ([id number]) -------------------------------------------------------------------------------- HPI Details Patient Name: [name], [name] Date of Service: 06/21/2017 2:00 PM Medical Record Number: [id number] Patient Account Number: [id number] Date of Birth/Sex: 05-04-1965 (52 y.o. M) Treating RN: [name] Primary Care Provider: Azucena Cecil, [name] Other Clinician: Referring Provider: Azucena Cecil, [name] Treating Provider/Extender: [name] in Treatment: 2 History of Present Illness HPI Description: 06/07/17 patient presents today for initial evaluation and our clinic regarding a new wound although he has previously been a patient here for similar issues. He has muscular dystrophy and secondary to this significant weakness and contractures of his lower extremities which therefore does tend to cause him to have recurrent ulcers on his gluteal, sacral, Ischial location. Right now he actually has a right trochanter wound which has been present for several weeks and according to his mother who does take care of them and in my pinion does an excellent job this has gotten better but still she was concerned about getting it to completely close so it does not worsened. Patient does have diabetes and his hemoglobin A1c is 8.0 patient did  see his primary care provider, [name], on 06/06/17 according to the after visit summary nothing was really addressed in regard to the wounds at that time however. Other than diabetes the patient is blind, have hypertension, and muscular dystrophy obviously. 06/21/17-he is here in follow-up evaluation for a right trochanter pressure injury he presents with a new area of skin stripping to a healed/scarred right ischium. he is able to calm every 2 weeks secondary to transportation  needs. He tolerated debridement, we will continue with collagen and he will follow-up in 2 weeks Electronic Signature(s) Signed: 06/21/2017 3:08:07 PM By: [name] Previous Signature: 06/21/2017 2:56:38 PM Version By: [name] Entered By: [name] on 06/21/2017 15:08:07 [name] ([id number]) -------------------------------------------------------------------------------- Physician Orders Details Patient Name: [name], [name] Date of Service: 06/21/2017 2:00 PM Medical Record Number: [id number] Patient Account Number: [id number] Date of Birth/Sex: 1965/04/15 (52 y.o. M) Treating RN: [name] Primary Care Provider: Azucena Cecil, [name] Other Clinician: Referring Provider: Azucena Cecil, [name] Treating Provider/Extender: [name] in Treatment: 2 Verbal / Phone Orders: No Diagnosis Coding Wound Cleansing Wound #4 Right Trochanter o Clean wound with Normal Saline. o Cleanse wound with mild soap and water o May Shower, gently pat wound dry prior to applying new dressing. Anesthetic (add to Medication List) Wound #4 Right Trochanter o Topical Lidocaine 4% cream applied to wound bed prior to debridement (In Clinic Only). Skin Barriers/Peri-Wound Care Wound #4 Right Trochanter o Skin Prep Primary Wound Dressing Wound #4 Right Trochanter o Silver Alginate Secondary Dressing Wound #4 Right Trochanter o Boardered Foam Dressing Wound #5 Left,Posterior Upper Leg o Boardered  Foam Dressing Dressing Change Frequency Wound #4 Right Trochanter o Change dressing every other day. Follow-up Appointments Wound #4 Right Trochanter o Return Appointment in 1 week. Wound #5 Left,Posterior Upper Leg o Return Appointment in 2 weeks. Off-Loading Wound #4 Right Trochanter o Turn and reposition every 2 hours Additional Orders / Instructions [name], [name] ([id number]) Wound #4 Right Trochanter o Increase protein intake. Electronic Signature(s) Signed: 06/21/2017 4:33:19 PM By: [name] Signed: 06/22/2017 8:17:02 AM By: [name] Entered By: [name] on 06/21/2017 14:50:41 Earll, [name] ([id number]) -------------------------------------------------------------------------------- Problem List Details Patient Name: [name], [name] Date of Service: 06/21/2017 2:00 PM Medical Record Number: [id number] Patient Account Number: [id number] Date of Birth/Sex: 03-02-1965 (52 y.o. 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 Date of Service:  06/21/2017 2:00 PM Medical Record Number: [id number] Patient Account Number: [id number] Date of Birth/Sex: 08-Jan-1966 (52 y.o. M) Treating RN: [name] Primary Care Provider: Azucena Cecil, [name] Other Clinician: Referring Provider: Azucena Cecil, [name] Treating Provider/Extender: [name] in Treatment: 2 Subjective Chief Complaint Information obtained from Patient Right Hip Ulcer History of Present Illness (HPI) 06/07/17 patient presents today for initial evaluation and our clinic regarding a new wound although he has previously been a patient here for similar issues. He has muscular dystrophy and secondary to this significant weakness and contractures of his lower extremities which therefore does tend to cause him to have recurrent ulcers on his gluteal, sacral, Ischial location. Right now he actually has a right trochanter wound which has been present for several weeks and according to his mother who does take care of them and in my pinion does an excellent job this has gotten better but still  she was concerned about getting it to completely close so it does not worsened. Patient does have diabetes and his hemoglobin A1c is 8.0 patient did see his primary care provider, [name], on 06/06/17 according to the after visit summary nothing was really addressed in regard to the wounds at that time however. Other than diabetes the patient is blind, have hypertension, and muscular dystrophy obviously. 06/21/17-he is here in follow-up evaluation for a right trochanter pressure injury he presents with a new area of skin stripping to a healed/scarred right ischium. he is able to calm every 2 weeks secondary to transportation needs. He tolerated debridement, we will continue with collagen and he will follow-up in 2 weeks Patient History Information obtained from Patient. Family History Cancer - Maternal Grandparents, Diabetes - Mother,Father, Heart Disease - Father, Hypertension - Mother,Father,  Thyroid Problems - Mother, No family history of Hereditary Spherocytosis, Kidney Disease, Lung Disease, Seizures, Stroke, Tuberculosis. Social History Never smoker, Marital Status - Single, Alcohol Use - Never - HX, Drug Use - No History, Caffeine Use - Daily. Medical And Surgical History Notes Eyes stigmatism Cardiovascular hypercholesterolemia Gastrointestinal GERD Immunological Muscular Distrophy Musculoskeletal Muscular Distrophy, SMA type 3 [name], [name] Objective Constitutional Vitals Time Taken: 2:22 PM, Temperature: 98.4 F, Pulse: 98 bpm, Respiratory Rate: 16 breaths/min, Blood Pressure: 146/76 mmHg. Integumentary (Hair, Skin) Wound #4 status is Open. Original cause of wound was Pressure Injury. The wound is located on the Right Trochanter. The wound measures 1.1cm length x 0.5cm width x 0.1cm depth; 0.432cm^2 area and 0.043cm^3 volume. There is Fat Layer (Subcutaneous Tissue) Exposed exposed. There is no tunneling or undermining noted. There is a medium amount of serous drainage noted. The wound margin is flat and intact. There is medium (34-66%) red granulation within the wound bed. There is a medium (34-66%) amount of necrotic tissue within the wound bed including Adherent Slough. The periwound skin appearance exhibited: Scarring, Ecchymosis. The periwound skin appearance did not exhibit: Callus, Crepitus, Excoriation, Induration, Rash, Dry/Scaly, Maceration, Atrophie Blanche, Cyanosis, Hemosiderin Staining, Mottled, Pallor, Rubor, Erythema. Periwound temperature was noted as No Abnormality. Wound #5 status is Open. Original cause of wound was trauma. The wound is located on the Right,Posterior Upper Leg. The wound measures 0.2cm length x 0.2cm width x 0.1cm depth; 0.031cm^2 area and 0.003cm^3 volume. There is no tunneling or undermining noted. There is a medium amount of serous drainage noted. The wound margin is flat and intact. There is no red  granulation within the wound bed. There is no necrotic tissue within the wound bed. The periwound skin appearance exhibited: Scarring. The periwound skin appearance did not exhibit: Callus, Crepitus, Excoriation, Induration, Rash, Dry/Scaly, Maceration, Atrophie Blanche, Cyanosis, Ecchymosis, Hemosiderin Staining, Mottled, Pallor, Rubor, Erythema. Periwound temperature was noted as No Abnormality. Assessment Active Problems ICD-10 L89.213 - Pressure ulcer of right hip, stage 3 G12.20 - Motor neuron disease, unspecified E11.622 - Type 2 diabetes mellitus with other skin ulcer E66.3 - Overweight S31.819S - Unspecified open wound of right buttock, sequela Procedures Wound #4 Pre-procedure diagnosis of Wound #4 is a Pressure Ulcer located on the Right Trochanter . There was a Excisional Skin/Subcutaneous Tissue Debridement with a total area of 0.55 sq cm performed by [name] Public, NP. With the following instrument(s): Curette to remove Viable and Non-Viable tissue/material. Material removed includes Subcutaneous Tissue and Slough and after achieving pain control using Other (lidocaine 4%). No specimens were taken. A time out was conducted at 14:46, prior to the start of the procedure.  A Minimum amount of bleeding was controlled with Pressure. The procedure was Mctigue, [name] ([id number]) tolerated well with a pain level of 0 throughout and a pain level of 0 following the procedure. Patient s Level of Consciousness post procedure was recorded as Awake and Alert. Post Debridement Measurements: 1.1cm length x 0.5cm width x 0.1cm depth; 0.043cm^3 volume. Post debridement Stage noted as Category/Stage II. Character of Wound/Ulcer Post Debridement is stable. Post procedure Diagnosis Wound #4: Same as Pre-Procedure Plan Wound Cleansing: Wound #4 Right Trochanter: Clean wound with Normal Saline. Cleanse wound with mild soap and water May Shower, gently pat wound dry prior to applying new  dressing. Anesthetic (add to Medication List): Wound #4 Right Trochanter: Topical Lidocaine 4% cream applied to wound bed prior to debridement (In Clinic Only). Skin Barriers/Peri-Wound Care: Wound #4 Right Trochanter: Skin Prep Primary Wound Dressing: Wound #4 Right Trochanter: Silver Alginate Secondary Dressing: Wound #4 Right Trochanter: Boardered Foam Dressing Wound #5 Left,Posterior Upper Leg: Boardered Foam Dressing Dressing Change Frequency: Wound #4 Right Trochanter: Change dressing every other day. Follow-up Appointments: Wound #4 Right Trochanter: Return Appointment in 1 week. Wound #5 Left,Posterior Upper Leg: Return Appointment in 2 weeks. Off-Loading: Wound #4 Right Trochanter: Turn and reposition every 2 hours Additional Orders / Instructions: Wound #4 Right Trochanter: Increase protein intake. Electronic Signature(s) Signed: 06/26/2017 9:22:57 PM By: [name] Previous Signature: 06/21/2017 6:28:16 PM Version By: [name] Entered By: [name] on 06/26/2017 21:22:57 Nabi, [name] ([id number]) -------------------------------------------------------------------------------- ROS/PFSH Details Patient Name: Siess, Crixus A. Date of Service: 06/21/2017 2:00 PM Medical Record Number: [id number] Patient Account Number: [id number] Date of Birth/Sex: [birth year] (52 y.o. M) Treating RN: [name] Primary Care Provider: Azucena Cecil, [name] Other Clinician: Referring Provider: Azucena Cecil, [name] Treating Provider/Extender: [name] in Treatment: 2 Information Obtained From Patient Wound History Do you currently have one or more open woundso Yes How many open wounds do you currently haveo 3 Approximately how long have you had your woundso 2 months How have you been treating your wound(s) until nowo aquacel Has your wound(s) ever healed and then re-openedo No Have you had any lab work done in the past montho No Have you tested positive for an  antibiotic resistant organism (MRSA, VRE)o No Have you tested positive for osteomyelitis (bone infection)o No Have you had any tests for circulation on your legso No Eyes Medical History: Negative for: Cataracts; Glaucoma; Optic Neuritis Past Medical History Notes: stigmatism Ear/Nose/Mouth/Throat Medical History: Negative for: Chronic sinus problems/congestion; Middle ear problems Hematologic/Lymphatic Medical History: Negative for: Anemia; Hemophilia; Human Immunodeficiency Virus; Lymphedema; Sickle Cell Disease Respiratory Medical History: Negative for: Aspiration; Asthma; Chronic Obstructive Pulmonary Disease (COPD); Pneumothorax; Sleep Apnea; Tuberculosis Cardiovascular Medical History: Positive for: Hypertension Negative for: Angina; Arrhythmia; Congestive Heart Failure; Coronary Artery Disease; Deep Vein Thrombosis; Hypotension; Myocardial Infarction; Peripheral Arterial Disease; Peripheral Venous Disease; Phlebitis; Vasculitis Past Medical History Notes: hypercholesterolemia Gastrointestinal Medical History: Negative for: Cirrhosis ; Colitis; Crohnos; Hepatitis A; Hepatitis B; Hepatitis C Past Medical History Notes: [name], [name] ([id number]) GERD Endocrine Medical History: Positive for: Type II Diabetes Time with diabetes: 2005 Treated with: Insulin, Oral agents Blood sugar tested every day: No Genitourinary Medical History: Negative for: End Stage Renal Disease Immunological Medical History: Negative for: Lupus Erythematosus; Raynaudos; Scleroderma Past Medical History Notes: Muscular Distrophy Integumentary (Skin) Medical History: Positive for: History of pressure wounds Negative for: History of Burn Musculoskeletal Medical History: Negative for: Gout; Rheumatoid Arthritis; Osteoarthritis; Osteomyelitis Past Medical History Notes: Muscular Distrophy, SMA type  3 Neurologic Medical History: Positive for: Quadriplegia Negative for: Dementia;  Neuropathy; Paraplegia; Seizure Disorder Oncologic Medical History: Negative for: Received Chemotherapy; Received Radiation Psychiatric Medical History: Negative for: Anorexia/bulimia; Confinement Anxiety Immunizations Pneumococcal Vaccine: Received Pneumococcal Vaccination: Yes Implantable Devices Family and Social History Stege, [name] ([id number]) Cancer: Yes - Maternal Grandparents; Diabetes: Yes - Mother,Father; Heart Disease: Yes - Father; Hereditary Spherocytosis: No; Hypertension: Yes - Mother,Father; Kidney Disease: No; Lung Disease: No; Seizures: No; Stroke: No; Thyroid Problems: Yes - Mother; Tuberculosis: No; Never smoker; Marital Status - Single; Alcohol Use: Never - HX; Drug Use: No History; Caffeine Use: Daily; Financial Concerns: No; Food, Clothing or Shelter Needs: No; Support System Lacking: No; Transportation Concerns: No; Advanced Directives: No; Patient does not want information on Advanced Directives; Do not resuscitate: No; Living Will: No; Medical Power of Attorney: No Physician Affirmation I have reviewed and agree with the above information. Electronic Signature(s) Signed: 06/21/2017 4:33:19 PM By: [name] Signed: 06/22/2017 8:17:02 AM By: [name] Entered By: [name] on 06/21/2017 15:08:16 Sardina, [name] ([id number]) -------------------------------------------------------------------------------- SuperBill Details Patient Name: Klose, Quinterrius A. Date of Service: 06/21/2017 Medical Record Number: [id number] Patient Account Number: [id number] Date of Birth/Sex: September 28, 1965 (52 y.o. 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

## 2017-06-23 NOTE — Progress Notes (Signed)
CALIL, AMOR ([id number]) Visit Report for 06/21/2017 Arrival Information Details Patient Name: [name], [name] Date of Service: 06/21/2017 2:00 PM Medical Record Number: [id number] Patient Account Number: [id number] Date of Birth/Sex: 07/20/1965 (52 y.o. M) Treating RN: Curtis Sites Primary Care Ilah Boule: Azucena Cecil, DAVID Other Clinician: Referring Julieana Eshleman: Azucena Cecil, DAVID Treating Januel Doolan/Extender: Kathreen Cosier in Treatment: 2 Visit Information History Since Last Visit Added or deleted any medications: No Patient Arrived: Wheel Chair Any new allergies or adverse reactions: No Arrival Time: 14:20 Had a fall or experienced change in No Accompanied By: mother activities of daily living that may affect Transfer Assistance: Michiel Sites Lift risk of falls: Patient Identification Verified: Yes Signs or symptoms of abuse/neglect since last visito No Secondary Verification Process Completed: Yes Hospitalized since last visit: No Patient Has Alerts: Yes Implantable device outside of the clinic excluding No Patient Alerts: DMII cellular tissue based products placed in the center since last visit: Has Dressing in Place as Prescribed: Yes Pain Present Now: No Electronic Signature(s) Signed: 06/21/2017 5:31:29 PM By: Curtis Sites Entered By: Curtis Sites on 06/21/2017 14:20:52 [name], [name] ([id number]) -------------------------------------------------------------------------------- Encounter Discharge Information Details Patient Name: [name], [name] Date of Service: 06/21/2017 2:00 PM Medical Record Number: [id number] Patient Account Number: [id number] Date of Birth/Sex: 1965-09-16 (52 y.o. M) Treating RN: Huel Coventry Primary Care Brizeyda Holtmeyer: Azucena Cecil, DAVID Other Clinician: Referring Latriece Anstine: Azucena Cecil, DAVID Treating Cady Hafen/Extender: Kathreen Cosier in Treatment: 2 Encounter Discharge Information Items Discharge Condition: Stable Ambulatory Status: Wheelchair Discharge  Destination: Home Transportation: Private Auto Accompanied By: mom Schedule Follow-up Appointment: Yes Clinical Summary of Care: Electronic Signature(s) Signed: 06/21/2017 5:02:07 PM By: Elliot Gurney, BSN, RN, CWS, [name] RN, BSN Entered By: Elliot Gurney, BSN, RN, CWS, [name] on 06/21/2017 15:01:28 Miron, [name] ([id number]) -------------------------------------------------------------------------------- Lower Extremity Assessment Details Patient Name: [name], [name] Date of Service: 06/21/2017 2:00 PM Medical Record Number: [id number] Patient Account Number: [id number] Date of Birth/Sex: 1966-01-07 (52 y.o. M) Treating RN: Curtis Sites Primary Care Timiko Offutt: Azucena Cecil, DAVID Other Clinician: Referring Consandra Laske: Azucena Cecil, DAVID Treating Ceaira Ernster/Extender: Kathreen Cosier in Treatment: 2 Electronic Signature(s) Signed: 06/21/2017 5:31:29 PM By: Curtis Sites Entered By: Curtis Sites on 06/21/2017 14:37:15 Preece, [name] ([id number]) -------------------------------------------------------------------------------- Multi Wound Chart Details Patient Name: [name], [name] Date of Service: 06/21/2017 2:00 PM Medical Record Number: [id number] Patient Account Number: [id number] Date of Birth/Sex: [birth year] (52 y.o. M) Treating RN: Renne Crigler Primary Care Yeison Sippel: Azucena Cecil, DAVID Other Clinician: Referring Iliyah Bui: Azucena Cecil, DAVID Treating Joud Ingwersen/Extender: Kathreen Cosier in Treatment: 2 Vital Signs Height(in): Pulse(bpm): 98 Weight(lbs): Blood Pressure(mmHg): 146/76 Body Mass Index(BMI): Temperature(F): 98.4 Respiratory Rate 16 (breaths/min): Photos: [4:No Photos] [5:No Photos] [N/A:N/A] Wound Location: [4:Right Trochanter] [5:Left Upper Leg - Posterior] [N/A:N/A] Wounding Event: [4:Pressure Injury] [5:Gradually Appeared] [N/A:N/A] Primary Etiology: [4:Pressure Ulcer] [5:Pressure Ulcer] [N/A:N/A] Comorbid History: [4:Hypertension, Type II Diabetes, History of pressure wounds,  Quadriplegia] [5:Hypertension, Type II Diabetes, History of pressure wounds, Quadriplegia] [N/A:N/A] Date Acquired: [4:04/09/2017] [5:06/21/2017] [N/A:N/A] Weeks of Treatment: [4:2] [5:0] [N/A:N/A] Wound Status: [4:Open] [5:Open] [N/A:N/A] Measurements L x W x D [4:1.1x0.5x0.1] [5:0.2x0.2x0.1] [N/A:N/A] (cm) Area (cm) : [4:0.432] [5:0.031] [N/A:N/A] Volume (cm) : [4:0.043] [5:0.003] [N/A:N/A] % Reduction in Area: [4:16.60%] [5:N/A] [N/A:N/A] % Reduction in Volume: [4:17.30%] [5:N/A] [N/A:N/A] Classification: [4:Category/Stage II] [5:Category/Stage II] [N/A:N/A] Exudate Amount: [4:Medium] [5:Medium] [N/A:N/A] Exudate Type: [4:Serous] [5:Serous] [N/A:N/A] Exudate Color: [4:amber] [5:amber] [N/A:N/A] Wound Margin: [4:Flat and Intact] [5:Flat and Intact] [N/A:N/A] Granulation Amount: [4:Medium (34-66%)] [5:Large (67-100%)] [N/A:N/A] Granulation Quality: [4:Red] [5:Red] [N/A:N/A] Necrotic Amount: [4:Medium (34-66%)] [5:None Present (0%)] [  N/A:N/A] Exposed Structures: [4:Fat Layer (Subcutaneous Tissue) Exposed: Yes Fascia: No Tendon: No Muscle: No Joint: No Bone: No] [5:Fascia: No Fat Layer (Subcutaneous Tissue) Exposed: No Tendon: No Muscle: No Joint: No Bone: No] [N/A:N/A] Epithelialization: [4:Medium (34-66%)] [5:None] [N/A:N/A] Periwound Skin Texture: [4:Scarring: Yes Excoriation: No Induration: No Callus: No] [5:Scarring: Yes Excoriation: No Induration: No Callus: No] [N/A:N/A] Crepitus: No Crepitus: No Rash: No Rash: No Periwound Skin Moisture: Maceration: No Maceration: No N/A Dry/Scaly: No Dry/Scaly: No Periwound Skin Color: Ecchymosis: Yes Atrophie Blanche: No N/A Atrophie Blanche: No Cyanosis: No Cyanosis: No Ecchymosis: No Erythema: No Erythema: No Hemosiderin Staining: No Hemosiderin Staining: No Mottled: No Mottled: No Pallor: No Pallor: No Rubor: No Rubor: No Temperature: No Abnormality No Abnormality N/A Tenderness on Palpation: No No N/A Wound  Preparation: Ulcer Cleansing: Ulcer Cleansing: N/A Rinsed/Irrigated with Saline Rinsed/Irrigated with Saline Topical Anesthetic Applied: Topical Anesthetic Applied: Other: lidocaine 4% Other: lidocaine 4% Treatment Notes Electronic Signature(s) Signed: 06/21/2017 4:33:19 PM By: Renne Crigler Entered By: Renne Crigler on 06/21/2017 14:46:15 [name], [name] ([id number]) -------------------------------------------------------------------------------- Multi-Disciplinary Care Plan Details Patient Name: [name], [name] Date of Service: 06/21/2017 2:00 PM Medical Record Number: [id number] Patient Account Number: [id number] Date of Birth/Sex: April 02, 1965 (52 y.o. M) Treating RN: Renne Crigler Primary Care Teckla Christiansen: Azucena Cecil, DAVID Other Clinician: Referring Mayling Aber: Azucena Cecil, DAVID Treating Theodore Rahrig/Extender: Kathreen Cosier in Treatment: 2 Active Inactive ` Abuse / Safety / Falls / Self Care Management Nursing Diagnoses: Potential for falls Goals: Patient will not experience any injury related to falls Date Initiated: 06/07/2017 Target Resolution Date: 10/06/2017 Goal Status: Active Interventions: Assess Activities of Daily Living upon admission and as needed Assess fall risk on admission and as needed Assess: immobility, friction, shearing, incontinence upon admission and as needed Assess impairment of mobility on admission and as needed per policy Assess personal safety and home safety (as indicated) on admission and as needed Notes: ` Nutrition Nursing Diagnoses: Imbalanced nutrition Impaired glucose control: actual or potential Potential for alteratiion in Nutrition/Potential for imbalanced nutrition Goals: Patient/caregiver agrees to and verbalizes understanding of need to use nutritional supplements and/or vitamins as prescribed Date Initiated: 06/07/2017 Target Resolution Date: 09/08/2017 Goal Status: Active Patient/caregiver will maintain therapeutic glucose  control Date Initiated: 06/07/2017 Target Resolution Date: 10/06/2017 Goal Status: Active Interventions: Assess patient nutrition upon admission and as needed per policy Provide education on elevated blood sugars and impact on wound healing Notes: ` MOFFITTGreg, Cratty ([id number]) Orientation to the Wound Care Program Nursing Diagnoses: Knowledge deficit related to the wound healing center program Goals: Patient/caregiver will verbalize understanding of the Wound Healing Center Program Date Initiated: 06/07/2017 Target Resolution Date: 07/07/2017 Goal Status: Active Interventions: Provide education on orientation to the wound center Notes: ` Pressure Nursing Diagnoses: Knowledge deficit related to causes and risk factors for pressure ulcer development Knowledge deficit related to management of pressures ulcers Potential for impaired tissue integrity related to pressure, friction, moisture, and shear Goals: Patient will remain free from development of additional pressure ulcers Date Initiated: 06/07/2017 Target Resolution Date: 09/08/2017 Goal Status: Active Interventions: Assess: immobility, friction, shearing, incontinence upon admission and as needed Assess offloading mechanisms upon admission and as needed Assess potential for pressure ulcer upon admission and as needed Provide education on pressure ulcers Notes: ` Wound/Skin Impairment Nursing Diagnoses: Impaired tissue integrity Knowledge deficit related to ulceration/compromised skin integrity Goals: Ulcer/skin breakdown will have a volume reduction of 80% by week 12 Date Initiated: 06/07/2017 Target Resolution Date: 09/29/2017 Goal Status: Active Interventions: Assess patient/caregiver ability  to perform ulcer/skin care regimen upon admission and as needed Assess ulceration(s) every visit Notes: TRAQUAN, DUARTE ([id number]) Electronic Signature(s) Signed: 06/21/2017 4:33:19 PM By: Renne Crigler Entered By: Renne Crigler on 06/21/2017 14:46:03 Fontenot, [name] ([id number]) -------------------------------------------------------------------------------- Pain Assessment Details Patient Name: [name], [name] Date of Service: 06/21/2017 2:00 PM Medical Record Number: [id number] Patient Account Number: [id number] Date of Birth/Sex: 1965-04-23 (52 y.o. M) Treating RN: Curtis Sites Primary Care Jaicey Sweaney: Azucena Cecil, DAVID Other Clinician: Referring Tkai Serfass: Azucena Cecil, DAVID Treating Jad Johansson/Extender: Kathreen Cosier in Treatment: 2 Active Problems Location of Pain Severity and Description of Pain Patient Has Paino Yes Site Locations Pain Location: Pain in Ulcers With Dressing Change: Yes Duration of the Pain. Constant / Intermittento Intermittent Pain Management and Medication Current Pain Management: Electronic Signature(s) Signed: 06/21/2017 5:31:29 PM By: Curtis Sites Entered By: Curtis Sites on 06/21/2017 14:22:20 Albaugh, [name] ([id number]) -------------------------------------------------------------------------------- Patient/Caregiver Education Details Patient Name: [name], [name] Date of Service: 06/21/2017 2:00 PM Medical Record Number: [id number] Patient Account Number: [id number] Date of Birth/Gender: 10/21/1965 (52 y.o. M) Treating RN: Huel Coventry Primary Care Physician: Azucena Cecil, DAVID Other Clinician: Referring Physician: Azucena Cecil, DAVID Treating Physician/Extender: Kathreen Cosier in Treatment: 2 Education Assessment Education Provided To: Caregiver Education Topics Provided Pressure: Handouts: Pressure Ulcers: Care and Offloading Methods: Demonstration, Explain/Verbal Responses: State content correctly Wound/Skin Impairment: Handouts: Caring for Your Ulcer, Other: are as prescribed Methods: Demonstration, Explain/Verbal Responses: State content correctly Electronic Signature(s) Signed: 06/21/2017 5:02:07 PM By: Elliot Gurney, BSN, RN, CWS, [name] RN, BSN Entered By: Elliot Gurney,  BSN, RN, CWS, [name] on 06/21/2017 15:01:56 [name], [name] ([id number]) -------------------------------------------------------------------------------- Wound Assessment Details Patient Name: [name], [name] Date of Service: 06/21/2017 2:00 PM Medical Record Number: [id number] Patient Account Number: [id number] Date of Birth/Sex: [birth year] (52 y.o. M) Treating RN: Curtis Sites Primary Care Jendaya Gossett: Azucena Cecil, DAVID Other Clinician: Referring Kao Berkheimer: Azucena Cecil, DAVID Treating Macey Wurtz/Extender: Kathreen Cosier in Treatment: 2 Wound Status Wound Number: 4 Primary Pressure Ulcer Etiology: Wound Location: Right Trochanter Wound Open Wounding Event: Pressure Injury Status: Date Acquired: 04/09/2017 Comorbid Hypertension, Type II Diabetes, History of Weeks Of Treatment: 2 History: pressure wounds, Quadriplegia Clustered Wound: No Photos Photo Uploaded By: Curtis Sites on 06/21/2017 16:51:34 Wound Measurements Length: (cm) 1.1 Width: (cm) 0.5 Depth: (cm) 0.1 Area: (cm) 0.432 Volume: (cm) 0.043 % Reduction in Area: 16.6% % Reduction in Volume: 17.3% Epithelialization: Medium (34-66%) Tunneling: No Undermining: No Wound Description Classification: Category/Stage III Wound Margin: Flat and Intact Exudate Amount: Medium Exudate Type: Serous Exudate Color: amber Foul Odor After Cleansing: No Slough/Fibrino Yes Wound Bed Granulation Amount: Medium (34-66%) Exposed Structure Granulation Quality: Red Fascia Exposed: No Necrotic Amount: Medium (34-66%) Fat Layer (Subcutaneous Tissue) Exposed: Yes Necrotic Quality: Adherent Slough Tendon Exposed: No Muscle Exposed: No Joint Exposed: No Bone Exposed: No Periwound Skin Texture [name], [name] ([id number]) Texture Color No Abnormalities Noted: No No Abnormalities Noted: No Callus: No Atrophie Blanche: No Crepitus: No Cyanosis: No Excoriation: No Ecchymosis: Yes Induration: No Erythema: No Rash: No Hemosiderin  Staining: No Scarring: Yes Mottled: No Pallor: No Moisture Rubor: No No Abnormalities Noted: No Dry / Scaly: No Temperature / Pain Maceration: No Temperature: No Abnormality Wound Preparation Ulcer Cleansing: Rinsed/Irrigated with Saline Topical Anesthetic Applied: Other: lidocaine 4%, Treatment Notes Wound #4 (Right Trochanter) 1. Cleansed with: Clean wound with Normal Saline 2. Anesthetic Topical Lidocaine 4% cream to wound bed prior to debridement 3. Peri-wound Care: Skin Prep 4. Dressing Applied: Prisma Ag 5. Secondary Dressing Applied Bordered Foam Dressing  Notes boarder foam dressing applied to area distal to wound to protect new healed skin area Electronic Signature(s) Signed: 06/21/2017 4:33:19 PM By: Renne Crigler Signed: 06/21/2017 5:31:29 PM By: Curtis Sites Entered By: Renne Crigler on 06/21/2017 14:49:25 Ades, [name] ([id number]) -------------------------------------------------------------------------------- Wound Assessment Details Patient Name: [name], Bren A. Date of Service: 06/21/2017 2:00 PM Medical Record Number: [id number] Patient Account Number: [id number] Date of Birth/Sex: 1965-12-24 (52 y.o. M) Treating RN: Curtis Sites Primary Care Breionna Punt: Azucena Cecil, DAVID Other Clinician: Referring Kaenan Jake: Azucena Cecil, DAVID Treating Madix Blowe/Extender: Kathreen Cosier in Treatment: 2 Wound Status Wound Number: 5 Primary Pressure Ulcer Etiology: Wound Location: Left Upper Leg - Posterior Wound Open Wounding Event: Gradually Appeared Status: Date Acquired: 06/21/2017 Comorbid Hypertension, Type II Diabetes, History of Weeks Of Treatment: 0 History: pressure wounds, Quadriplegia Clustered Wound: No Photos Photo Uploaded By: Curtis Sites on 06/21/2017 16:48:51 Wound Measurements Length: (cm) 0.2 Width: (cm) 0.2 Depth: (cm) 0.1 Area: (cm) 0.031 Volume: (cm) 0.003 % Reduction in Area: % Reduction in Volume: Epithelialization:  None Tunneling: No Undermining: No Wound Description Classification: Category/Stage II Wound Margin: Flat and Intact Exudate Amount: Medium Exudate Type: Serous Exudate Color: amber Foul Odor After Cleansing: No Slough/Fibrino No Wound Bed Granulation Amount: Large (67-100%) Exposed Structure Granulation Quality: Red Fascia Exposed: No Necrotic Amount: None Present (0%) Fat Layer (Subcutaneous Tissue) Exposed: No Tendon Exposed: No Muscle Exposed: No Joint Exposed: No Bone Exposed: No Periwound Skin Texture Kempa, Larin A. ([id number]) Texture Color No Abnormalities Noted: No No Abnormalities Noted: No Callus: No Atrophie Blanche: No Crepitus: No Cyanosis: No Excoriation: No Ecchymosis: No Induration: No Erythema: No Rash: No Hemosiderin Staining: No Scarring: Yes Mottled: No Pallor: No Moisture Rubor: No No Abnormalities Noted: No Dry / Scaly: No Temperature / Pain Maceration: No Temperature: No Abnormality Wound Preparation Ulcer Cleansing: Rinsed/Irrigated with Saline Topical Anesthetic Applied: Other: lidocaine 4%, Electronic Signature(s) Signed: 06/21/2017 5:31:29 PM By: Curtis Sites Entered By: Curtis Sites on 06/21/2017 14:37:05 Moltz, [name] ([id number]) -------------------------------------------------------------------------------- Vitals Details Patient Name: Murtha, Nason A. Date of Service: 06/21/2017 2:00 PM Medical Record Number: [id number] Patient Account Number: [id number] Date of Birth/Sex: 1965/06/22 (52 y.o. M) Treating RN: Curtis Sites Primary Care Elridge Stemm: Azucena Cecil, DAVID Other Clinician: Referring Rikki Smestad: Azucena Cecil, DAVID Treating Jun Rightmyer/Extender: Kathreen Cosier in Treatment: 2 Vital Signs Time Taken: 14:22 Temperature (F): 98.4 Pulse (bpm): 98 Respiratory Rate (breaths/min): 16 Blood Pressure (mmHg): 146/76 Reference Range: 80 - 120 mg / dl Electronic Signature(s) Signed: 06/21/2017 5:31:29 PM By: Curtis Sites Entered By: Curtis Sites on 06/21/2017 14:24:39

## 2017-07-06 ENCOUNTER — Encounter: Payer: Medicare Other | Attending: Nurse Practitioner | Admitting: Nurse Practitioner

## 2017-07-06 DIAGNOSIS — G8250 Quadriplegia, unspecified: Secondary | ICD-10-CM | POA: Diagnosis not present

## 2017-07-06 DIAGNOSIS — E11622 Type 2 diabetes mellitus with other skin ulcer: Secondary | ICD-10-CM | POA: Diagnosis present

## 2017-07-06 DIAGNOSIS — Z833 Family history of diabetes mellitus: Secondary | ICD-10-CM | POA: Diagnosis not present

## 2017-07-06 DIAGNOSIS — Z809 Family history of malignant neoplasm, unspecified: Secondary | ICD-10-CM | POA: Insufficient documentation

## 2017-07-06 DIAGNOSIS — I10 Essential (primary) hypertension: Secondary | ICD-10-CM | POA: Insufficient documentation

## 2017-07-06 DIAGNOSIS — L89322 Pressure ulcer of left buttock, stage 2: Secondary | ICD-10-CM | POA: Insufficient documentation

## 2017-07-06 DIAGNOSIS — E1151 Type 2 diabetes mellitus with diabetic peripheral angiopathy without gangrene: Secondary | ICD-10-CM | POA: Insufficient documentation

## 2017-07-06 DIAGNOSIS — L89312 Pressure ulcer of right buttock, stage 2: Secondary | ICD-10-CM | POA: Diagnosis not present

## 2017-07-06 DIAGNOSIS — K219 Gastro-esophageal reflux disease without esophagitis: Secondary | ICD-10-CM | POA: Diagnosis not present

## 2017-07-06 DIAGNOSIS — E7800 Pure hypercholesterolemia, unspecified: Secondary | ICD-10-CM | POA: Diagnosis not present

## 2017-07-06 DIAGNOSIS — Z8349 Family history of other endocrine, nutritional and metabolic diseases: Secondary | ICD-10-CM | POA: Insufficient documentation

## 2017-07-06 DIAGNOSIS — L89213 Pressure ulcer of right hip, stage 3: Secondary | ICD-10-CM | POA: Insufficient documentation

## 2017-07-06 DIAGNOSIS — E663 Overweight: Secondary | ICD-10-CM | POA: Diagnosis not present

## 2017-07-06 DIAGNOSIS — G7100 Muscular dystrophy, unspecified: Secondary | ICD-10-CM | POA: Diagnosis not present

## 2017-07-06 DIAGNOSIS — Z8249 Family history of ischemic heart disease and other diseases of the circulatory system: Secondary | ICD-10-CM | POA: Insufficient documentation

## 2017-07-09 NOTE — Progress Notes (Signed)
[name] RobertMOFFITT, [name] ([id number]) Visit Report for 07/06/2017 Chief Complaint Document Details Patient Name: [name], [name] FickleUL A. Date of Service: 07/06/2017 1:15 PM Medical Record Number: [id number] Patient Account Number: [id number] Date of Birth/Sex: Jun 24, 1965 (52 y.o. M) Treating RN: Curtis Sitesorthy, Joanna Primary Care Provider: Azucena CecilSWAYNE, DAVID Other Clinician: Referring Provider: Azucena CecilSWAYNE, DAVID Treating Provider/Extender: Kathreen Cosieroulter, Amun Stemm Weeks in Treatment: 4 Information Obtained from: Patient Chief Complaint multiple ulcers/wounds Electronic Signature(s) Signed: 07/06/2017 3:20:36 PM By: Bonnell Publicoulter, Meila Berke Previous Signature: 07/06/2017 3:20:11 PM Version By: Bonnell Publicoulter, Johnelle Tafolla Entered By: Bonnell Publicoulter, Thorsten Climer on 07/06/2017 15:20:35 Best, [name] ([id number]) -------------------------------------------------------------------------------- HPI Details Patient Name: [name], [name] Date of Service: 07/06/2017 1:15 PM Medical Record Number: [id number] Patient Account Number: [id number] Date of Birth/Sex: Jun 24, 1965 (52 y.o. M) Treating RN: Curtis Sitesorthy, Joanna Primary Care Provider: Azucena CecilSWAYNE, DAVID Other Clinician: Referring Provider: Azucena CecilSWAYNE, DAVID Treating Provider/Extender: Kathreen Cosieroulter, Shelle Galdamez Weeks in Treatment: 4 History of Present Illness HPI Description: 06/07/17 patient presents today for initial evaluation and our clinic regarding a new wound although he has previously been a patient here for similar issues. He has muscular dystrophy and secondary to this significant weakness and contractures of his lower extremities which therefore does tend to cause him to have recurrent ulcers on his gluteal, sacral, Ischial location. Right now he actually has a right trochanter wound which has been present for several weeks and according to his mother who does take care of them and in my pinion does an excellent job this has gotten better but still she was concerned about getting it to completely close so it does not worsened. Patient does have  diabetes and his hemoglobin A1c is 8.0 patient did see his primary care provider, Alanson PulsPaul Best, on 06/06/17 according to the after visit summary nothing was really addressed in regard to the wounds at that time however. Other than diabetes the patient is blind, have hypertension, and muscular dystrophy obviously. 06/21/17-he is here in follow-up evaluation for a right trochanter pressure injury he presents with a new area of skin stripping to a healed/scarred right ischium. he is able to calm every 2 weeks secondary to transportation needs. He tolerated debridement, we will continue with collagen and he will follow-up in 2 weeks 07/06/17-He is here in follow-up evaluation, accompanied by his mother, for evaluation of right trochanter, right ischium ulcers with a new area of concern to the left fascia. All areas are superficial at today's visit. We will protect him with foam border. We will order for nu-motion to evaluate chair cushion; the cushion in the chair he spends most of his time on is "decades" old and the chair is his newer motorized chair is "too hard". He will follow up in two week Electronic Signature(s) Signed: 07/06/2017 3:36:34 PM By: Bonnell Publicoulter, Brittnee Gaetano Entered By: Bonnell Publicoulter, Zamora Colton on 07/06/2017 15:36:34 [name], [name] ([id number]) -------------------------------------------------------------------------------- Physician Orders Details Patient Name: [name], [name] Date of Service: 07/06/2017 1:15 PM Medical Record Number: [id number] Patient Account Number: [id number] Date of Birth/Sex: Jun 24, 1965 (52 y.o. M) Treating RN: Curtis Sitesorthy, Joanna Primary Care Provider: Azucena CecilSWAYNE, DAVID Other Clinician: Referring Provider: Azucena CecilSWAYNE, DAVID Treating Provider/Extender: Kathreen Cosieroulter, Tanikka Bresnan Weeks in Treatment: 4 Verbal / Phone Orders: [name] Diagnosis Coding Wound Cleansing Wound #4 Right Trochanter o Clean wound with Normal Saline. o Cleanse wound with mild soap and water o May Shower, gently pat wound dry  prior to applying new dressing. Wound #5 Right,Posterior Ischium o Clean wound with Normal Saline. o Cleanse wound with mild soap and water o May Shower, gently pat wound dry prior to  applying new dressing. Wound #6 Left Ischium o Clean wound with Normal Saline. o Cleanse wound with mild soap and water o May Shower, gently pat wound dry prior to applying new dressing. Anesthetic (add to Medication List) Wound #4 Right Trochanter o Topical Lidocaine 4% cream applied to wound bed prior to debridement (In Clinic Only). Wound #5 Right,Posterior Ischium o Topical Lidocaine 4% cream applied to wound bed prior to debridement (In Clinic Only). Wound #6 Left Ischium o Topical Lidocaine 4% cream applied to wound bed prior to debridement (In Clinic Only). Skin Barriers/Peri-Wound Care Wound #4 Right Trochanter o Skin Prep Wound #5 Right,Posterior Ischium o Skin Prep Wound #6 Left Ischium o Skin Prep Secondary Dressing Wound #4 Right Trochanter o Boardered Foam Dressing Wound #5 Right,Posterior Ischium o Boardered Foam Dressing Wound #6 Left Ischium [name], [name] ([id number]) o Boardered Foam Dressing Dressing Change Frequency Wound #4 Right Trochanter o Change dressing every other day. Wound #5 Right,Posterior Ischium o Change dressing every other day. Wound #6 Left Ischium o Change dressing every other day. Follow-up Appointments Wound #4 Right Trochanter o Return Appointment in 1 week. Wound #5 Right,Posterior Ischium o Return Appointment in 1 week. Wound #6 Left Ischium o Return Appointment in 1 week. Off-Loading Wound #4 Right Trochanter o Turn and reposition every 2 hours Wound #5 Right,Posterior Ischium o Turn and reposition every 2 hours Wound #6 Left Ischium o Turn and reposition every 2 hours Additional Orders / Instructions Wound #4 Right Trochanter o Increase protein intake. Wound #5 Right,Posterior  Ischium o Increase protein intake. Wound #6 Left Ischium o Increase protein intake. Electronic Signature(s) Signed: 07/06/2017 4:22:23 PM By: Curtis Sites Signed: 07/06/2017 4:45:12 PM By: Bonnell Public Entered By: Curtis Sites on 07/06/2017 13:55:09 Jinkins, [name] ([id number]) -------------------------------------------------------------------------------- Problem List Details Patient Name: [name], [name] Date of Service: 07/06/2017 1:15 PM Medical Record Number: [id number] Patient Account Number: [id number] Date of Birth/Sex: [birth year] (52 y.o. 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, unspecified 06/08/2017 [name] Yes E11.622 Type 2 diabetes mellitus with other skin ulcer 06/08/2017 [name] Yes E66.3 Overweight 06/08/2017 [name] Yes S31.819S Unspecified open wound of right buttock, sequela 06/21/2017 [name] Yes L89.312 Pressure ulcer of right buttock, stage 2 07/06/2017 [name] Yes L89.322 Pressure ulcer of left buttock, stage 2 07/06/2017 [name] Yes Inactive Problems Resolved Problems Electronic Signature(s) Signed: 07/06/2017 3:19:42 PM By: Bonnell Public Entered By: Bonnell Public on 07/06/2017 15:19:42 Negrete, [name] ([id number]) -------------------------------------------------------------------------------- Progress Note Details Patient Name: [name], [name] Date of Service: 07/06/2017 1:15 PM Medical Record Number: [id number] Patient Account Number: [id number] Date of Birth/Sex: 12/27/1965 (51 y.o. M) Treating RN: Curtis Sites Primary Care Provider: Azucena Cecil, DAVID Other Clinician: Referring Provider: Azucena Cecil, DAVID Treating Provider/Extender: Kathreen Cosier in  Treatment: 4 Subjective Chief Complaint Information obtained from Patient multiple ulcers/wounds History of Present Illness (HPI) 06/07/17 patient presents today for initial evaluation and our clinic regarding a new wound although he has previously been a patient here for similar issues. He has muscular dystrophy and secondary to this significant weakness and contractures of his lower extremities which therefore does tend to cause him to have recurrent ulcers on his gluteal, sacral, Ischial location. Right now he actually has a right trochanter wound which has been  present for several weeks and according to his mother who does take care of them and in my pinion does an excellent job this has gotten better but still she was concerned about getting it to completely close so it does not worsened. Patient does have diabetes and his hemoglobin A1c is 8.0 patient did see his primary care provider, Alanson Puls, on 06/06/17 according to the after visit summary nothing was really addressed in regard to the wounds at that time however. Other than diabetes the patient is blind, have hypertension, and muscular dystrophy obviously. 06/21/17-he is here in follow-up evaluation for a right trochanter pressure injury he presents with a new area of skin stripping to a healed/scarred right ischium. he is able to calm every 2 weeks secondary to transportation needs. He tolerated debridement, we will continue with collagen and he will follow-up in 2 weeks 07/06/17-He is here in follow-up evaluation, accompanied by his mother, for evaluation of right trochanter, right ischium ulcers with a new area of concern to the left fascia. All areas are superficial at today's visit. We will protect him with foam border. We will order for nu-motion to evaluate chair cushion; the cushion in the chair he spends most of his time on is "decades" old and the chair is his newer motorized chair is "too hard". He will follow up in two  week Patient History Information obtained from Patient. Family History Cancer - Maternal Grandparents, Diabetes - Mother,Father, Heart Disease - Father, Hypertension - Mother,Father, Thyroid Problems - Mother, [name] family history of Hereditary Spherocytosis, Kidney Disease, Lung Disease, Seizures, Stroke, Tuberculosis. Social History Never smoker, Marital Status - Single, Alcohol Use - Never - HX, Drug Use - [name] History, Caffeine Use - Daily. Medical And Surgical History Notes Eyes stigmatism Cardiovascular hypercholesterolemia Gastrointestinal GERD Immunological Muscular Distrophy Musculoskeletal Muscular Distrophy, SMA type 3 [name], [name] ([id number]) Objective Constitutional Vitals Time Taken: 1:18 PM, Temperature: 98.1 F, Pulse: 95 bpm, Respiratory Rate: 16 breaths/min, Blood Pressure: 147/75 mmHg. Integumentary (Hair, Skin) Wound #4 status is Open. Original cause of wound was Pressure Injury. The wound is located on the Right Trochanter. The wound measures 1.2cm length x 0.7cm width x 0.1cm depth; 0.66cm^2 area and 0.066cm^3 volume. There is Fat Layer (Subcutaneous Tissue) Exposed exposed. There is [name] tunneling or undermining noted. There is a medium amount of serous drainage noted. The wound margin is flat and intact. There is small (1-33%) red granulation within the wound bed. There is a large (67-100%) amount of necrotic tissue within the wound bed including Adherent Slough. The periwound skin appearance exhibited: Scarring, Ecchymosis. The periwound skin appearance did not exhibit: Callus, Crepitus, Excoriation, Induration, Rash, Dry/Scaly, Maceration, Atrophie Blanche, Cyanosis, Hemosiderin Staining, Mottled, Pallor, Rubor, Erythema. Periwound temperature was noted as [name] Abnormality. Wound #5 status is Open. Original cause of wound was Trauma. The wound is located on the Right,Posterior Ischium. The wound measures 0.5cm length x 0.3cm width x 0.1cm depth; 0.118cm^2 area  and 0.012cm^3 volume. There is [name] tunneling or undermining noted. There is a medium amount of serous drainage noted. The wound margin is flat and intact. There is large (67-100%) red granulation within the wound bed. There is [name] necrotic tissue within the wound bed. The periwound skin appearance exhibited: Scarring. The periwound skin appearance did not exhibit: Callus, Crepitus, Excoriation, Induration, Rash, Dry/Scaly, Maceration, Atrophie Blanche, Cyanosis, Ecchymosis, Hemosiderin Staining, Mottled, Pallor, Rubor, Erythema. Periwound temperature was noted as [name] Abnormality. Wound #6 status is Open. Original cause of wound was Trauma.  The wound is located on the Left Ischium. The wound measures 0.2cm length x 0.2cm width x 0.1cm depth; 0.031cm^2 area and 0.003cm^3 volume. There is [name] tunneling or undermining noted. There is a medium amount of serous drainage noted. The wound margin is flat and intact. There is large (67-100%) red granulation within the wound bed. There is [name] necrotic tissue within the wound bed. The periwound skin appearance did not exhibit: Callus, Crepitus, Excoriation, Induration, Rash, Scarring, Dry/Scaly, Maceration, Atrophie Blanche, Cyanosis, Ecchymosis, Hemosiderin Staining, Mottled, Pallor, Rubor, Erythema. Assessment Active Problems ICD-10 Pressure ulcer of right hip, stage 3 Motor neuron disease, unspecified Type 2 diabetes mellitus with other skin ulcer Overweight Unspecified open wound of right buttock, sequela Pressure ulcer of right buttock, stage 2 Pressure ulcer of left buttock, stage 2 [name], [name] Plan Wound Cleansing: Wound #4 Right Trochanter: Clean wound with Normal Saline. Cleanse wound with mild soap and water May Shower, gently pat wound dry prior to applying new dressing. Wound #5 Right,Posterior Ischium: Clean wound with Normal Saline. Cleanse wound with mild soap and water May Shower, gently pat wound dry prior to applying  new dressing. Wound #6 Left Ischium: Clean wound with Normal Saline. Cleanse wound with mild soap and water May Shower, gently pat wound dry prior to applying new dressing. Anesthetic (add to Medication List): Wound #4 Right Trochanter: Topical Lidocaine 4% cream applied to wound bed prior to debridement (In Clinic Only). Wound #5 Right,Posterior Ischium: Topical Lidocaine 4% cream applied to wound bed prior to debridement (In Clinic Only). Wound #6 Left Ischium: Topical Lidocaine 4% cream applied to wound bed prior to debridement (In Clinic Only). Skin Barriers/Peri-Wound Care: Wound #4 Right Trochanter: Skin Prep Wound #5 Right,Posterior Ischium: Skin Prep Wound #6 Left Ischium: Skin Prep Secondary Dressing: Wound #4 Right Trochanter: Boardered Foam Dressing Wound #5 Right,Posterior Ischium: Boardered Foam Dressing Wound #6 Left Ischium: Boardered Foam Dressing Dressing Change Frequency: Wound #4 Right Trochanter: Change dressing every other day. Wound #5 Right,Posterior Ischium: Change dressing every other day. Wound #6 Left Ischium: Change dressing every other day. Follow-up Appointments: Wound #4 Right Trochanter: Return Appointment in 1 week. Wound #5 Right,Posterior Ischium: Return Appointment in 1 week. Wound #6 Left Ischium: Return Appointment in 1 week. Off-Loading: Wound #4 Right Trochanter: Turn and reposition every 2 hours [name], [name] ([id number]) Wound #5 Right,Posterior Ischium: Turn and reposition every 2 hours Wound #6 Left Ischium: Turn and reposition every 2 hours Additional Orders / Instructions: Wound #4 Right Trochanter: Increase protein intake. Wound #5 Right,Posterior Ischium: Increase protein intake. Wound #6 Left Ischium: Increase protein intake. Electronic Signature(s) Signed: 07/06/2017 3:37:01 PM By: Bonnell Public Entered By: Bonnell Public on 07/06/2017 15:37:01 [name], [name]  ([id number]) -------------------------------------------------------------------------------- ROS/PFSH Details Patient Name: [name], [name] Date of Service: 07/06/2017 1:15 PM Medical Record Number: [id number] Patient Account Number: [id number] Date of Birth/Sex: 02-Dec-1965 (52 y.o. M) Treating RN: Curtis Sites Primary Care Provider: Azucena Cecil, DAVID Other Clinician: Referring Provider: Azucena Cecil, DAVID Treating Provider/Extender: Kathreen Cosier in Treatment: 4 Information Obtained From Patient Wound History Do you currently have one or more open woundso Yes How many open wounds do you currently haveo 3 Approximately how long have you had your woundso 2 months How have you been treating your wound(s) until nowo aquacel Has your wound(s) ever healed and then re-openedo [name] Have you had any lab work done in the past montho [name] Have you tested positive for an antibiotic resistant organism (MRSA, VRE)o [name] Have you  tested positive for osteomyelitis (bone infection)o [name] Have you had any tests for circulation on your legso [name] Eyes Medical History: Negative for: Cataracts; Glaucoma; Optic Neuritis Past Medical History Notes: stigmatism Ear/Nose/Mouth/Throat Medical History: Negative for: Chronic sinus problems/congestion; Middle ear problems Hematologic/Lymphatic Medical History: Negative for: Anemia; Hemophilia; Human Immunodeficiency Virus; Lymphedema; Sickle Cell Disease Respiratory Medical History: Negative for: Aspiration; Asthma; Chronic Obstructive Pulmonary Disease (COPD); Pneumothorax; Sleep Apnea; Tuberculosis Cardiovascular Medical History: Positive for: Hypertension Negative for: Angina; Arrhythmia; Congestive Heart Failure; Coronary Artery Disease; Deep Vein Thrombosis; Hypotension; Myocardial Infarction; Peripheral Arterial Disease; Peripheral Venous Disease; Phlebitis; Vasculitis Past Medical History Notes: hypercholesterolemia Gastrointestinal Medical  History: Negative for: Cirrhosis ; Colitis; Crohnos; Hepatitis A; Hepatitis B; Hepatitis C Past Medical History Notes: [name], NOAH LEMBKE ([id number]) GERD Endocrine Medical History: Positive for: Type II Diabetes Time with diabetes: 2005 Treated with: Insulin, Oral agents Blood sugar tested every day: [name] Genitourinary Medical History: Negative for: End Stage Renal Disease Immunological Medical History: Negative for: Lupus Erythematosus; Raynaudos; Scleroderma Past Medical History Notes: Muscular Distrophy Integumentary (Skin) Medical History: Positive for: History of pressure wounds Negative for: History of Burn Musculoskeletal Medical History: Negative for: Gout; Rheumatoid Arthritis; Osteoarthritis; Osteomyelitis Past Medical History Notes: Muscular Distrophy, SMA type 3 Neurologic Medical History: Positive for: Quadriplegia Negative for: Dementia; Neuropathy; Paraplegia; Seizure Disorder Oncologic Medical History: Negative for: Received Chemotherapy; Received Radiation Psychiatric Medical History: Negative for: Anorexia/bulimia; Confinement Anxiety Immunizations Pneumococcal Vaccine: Received Pneumococcal Vaccination: Yes Implantable Devices Family and Social History Hansson, CHOSEN GESKE ([id number]) Cancer: Yes - Maternal Grandparents; Diabetes: Yes - Mother,Father; Heart Disease: Yes - Father; Hereditary Spherocytosis: [name]; Hypertension: Yes - Mother,Father; Kidney Disease: [name]; Lung Disease: [name]; Seizures: [name]; Stroke: [name]; Thyroid Problems: Yes - Mother; Tuberculosis: [name]; Never smoker; Marital Status - Single; Alcohol Use: Never - HX; Drug Use: [name] History; Caffeine Use: Daily; Financial Concerns: [name]; Food, Clothing or Shelter Needs: [name]; Support System Lacking: [name]; Transportation Concerns: [name]; Advanced Directives: [name]; Patient does not want information on Advanced Directives; Do not resuscitate: [name]; Living Will: [name]; Medical Power of Attorney: [name] Physician Affirmation I  have reviewed and agree with the above information. Electronic Signature(s) Signed: 07/06/2017 4:22:23 PM By: Curtis Sites Signed: 07/06/2017 4:45:12 PM By: Bonnell Public Entered By: Bonnell Public on 07/06/2017 15:36:45 Vicars, [name] ([id number]) -------------------------------------------------------------------------------- SuperBill Details Patient Name: Fessenden, Corydon A. Date of Service: 07/06/2017 Medical Record Number: [id number] Patient Account Number: [id number] Date of Birth/Sex: 10-30-1965 (52 y.o. 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, unspecified E11.622 Type 2 diabetes mellitus with other skin ulcer E66.3 Overweight S31.819S Unspecified open wound of right buttock, sequela L89.312 Pressure ulcer of right buttock, stage 2 L89.322 Pressure ulcer of left buttock, stage 2 Facility Procedures CPT4 Code: [id number] Description: 99214 - WOUND CARE VISIT-LEV 4 EST PT Modifier: Quantity: 1 Physician Procedures CPT4 Code: [id number] Description: 99213 - WC PHYS LEVEL 3 - EST PT ICD-10 Diagnosis Description 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

## 2017-07-12 NOTE — Progress Notes (Signed)
BRYTON, ROMAGNOLI ([id number]) Visit Report for 07/06/2017 Arrival Information Details Patient Name: [name], [name] Date of Service: 07/06/2017 1:15 PM Medical Record Number: [id number] Patient Account Number: [id number] Date of Birth/Sex: 01-Sep-1965 (52 y.o. M) Treating RN: Huel Coventry Primary Care Lyndon Chenoweth: Azucena Cecil, DAVID Other Clinician: Referring Chelbi Herber: Azucena Cecil, DAVID Treating Doryce Mcgregory/Extender: Kathreen Cosier in Treatment: 4 Visit Information History Since Last Visit Added or deleted any medications: No Patient Arrived: Wheel Chair Any new allergies or adverse reactions: No Arrival Time: 13:18 Had a fall or experienced change in No Accompanied By: mother activities of daily living that may affect Transfer Assistance: Michiel Sites Lift risk of falls: Patient Identification Verified: Yes Signs or symptoms of abuse/neglect since last visito No Secondary Verification Process Completed: Yes Hospitalized since last visit: No Patient Has Alerts: Yes Implantable device outside of the clinic excluding No Patient Alerts: DMII cellular tissue based products placed in the center since last visit: Has Dressing in Place as Prescribed: Yes Pain Present Now: No Electronic Signature(s) Signed: 07/10/2017 3:21:59 PM By: Elliot Gurney, BSN, RN, CWS, [name] RN, BSN Entered By: Elliot Gurney, BSN, RN, CWS, [name] on 07/06/2017 13:18:42 [name] ([id number]) -------------------------------------------------------------------------------- Clinic Level of Care Assessment Details Patient Name: Broadhead, Dorell A. Date of Service: 07/06/2017 1:15 PM Medical Record Number: [id number] Patient Account Number: [id number] Date of Birth/Sex: [birth year] (52 y.o. M) Treating RN: Curtis Sites Primary Care Sunita Demond: Azucena Cecil, DAVID Other Clinician: Referring Copper Basnett: Azucena Cecil, DAVID Treating Nicholes Hibler/Extender: Kathreen Cosier in Treatment: 4 Clinic Level of Care Assessment Items TOOL 4 Quantity Score []  - Use when only an  EandM is performed on FOLLOW-UP visit 0 ASSESSMENTS - Nursing Assessment / Reassessment X - Reassessment of Co-morbidities (includes updates in patient status) 1 10 X- 1 5 Reassessment of Adherence to Treatment Plan ASSESSMENTS - Wound and Skin Assessment / Reassessment []  - Simple Wound Assessment / Reassessment - one wound 0 X- 3 5 Complex Wound Assessment / Reassessment - multiple wounds []  - 0 Dermatologic / Skin Assessment (not related to wound area) ASSESSMENTS - Focused Assessment []  - Circumferential Edema Measurements - multi extremities 0 []  - 0 Nutritional Assessment / Counseling / Intervention []  - 0 Lower Extremity Assessment (monofilament, tuning fork, pulses) []  - 0 Peripheral Arterial Disease Assessment (using hand held doppler) ASSESSMENTS - Ostomy and/or Continence Assessment and Care []  - Incontinence Assessment and Management 0 []  - 0 Ostomy Care Assessment and Management (repouching, etc.) PROCESS - Coordination of Care X - Simple Patient / Family Education for ongoing care 1 15 []  - 0 Complex (extensive) Patient / Family Education for ongoing care []  - 0 Staff obtains Chiropractor, Records, Test Results / Process Orders []  - 0 Staff telephones HHA, Nursing Homes / Clarify orders / etc []  - 0 Routine Transfer to another Facility (non-emergent condition) []  - 0 Routine Hospital Admission (non-emergent condition) []  - 0 New Admissions / Manufacturing engineer / Ordering NPWT, Apligraf, etc. []  - 0 Emergency Hospital Admission (emergent condition) []  - 0 Simple Discharge Coordination Sparks, Jens A. ([id number]) X- 1 15 Complex (extensive) Discharge Coordination PROCESS - Special Needs []  - Pediatric / Minor Patient Management 0 []  - 0 Isolation Patient Management []  - 0 Hearing / Language / Visual special needs []  - 0 Assessment of Community assistance (transportation, D/C planning, etc.) []  - 0 Additional assistance / Altered mentation X- 1  15 Support Surface(s) Assessment (bed, cushion, seat, etc.) INTERVENTIONS - Wound Cleansing / Measurement []  - Simple Wound Cleansing - one wound 0 X-  3 5 Complex Wound Cleansing - multiple wounds X- 1 5 Wound Imaging (photographs - any number of wounds) []  - 0 Wound Tracing (instead of photographs) []  - 0 Simple Wound Measurement - one wound X- 3 5 Complex Wound Measurement - multiple wounds INTERVENTIONS - Wound Dressings X - Small Wound Dressing one or multiple wounds 3 10 []  - 0 Medium Wound Dressing one or multiple wounds []  - 0 Large Wound Dressing one or multiple wounds []  - 0 Application of Medications - topical []  - 0 Application of Medications - injection INTERVENTIONS - Miscellaneous []  - External ear exam 0 []  - 0 Specimen Collection (cultures, biopsies, blood, body fluids, etc.) []  - 0 Specimen(s) / Culture(s) sent or taken to Lab for analysis X- 1 10 Patient Transfer (multiple staff / Nurse, adult / Similar devices) []  - 0 Simple Staple / Suture removal (25 or less) []  - 0 Complex Staple / Suture removal (26 or more) []  - 0 Hypo / Hyperglycemic Management (close monitor of Blood Glucose) []  - 0 Ankle / Brachial Index (ABI) - do not check if billed separately []  - 0 Vital Signs Locascio, Nemiah A. ([id number]) Has the patient been seen at the hospital within the last three years: Yes Total Score: 150 Level Of Care: New/Established - Level 4 Electronic Signature(s) Signed: 07/06/2017 4:22:23 PM By: Curtis Sites Entered By: Curtis Sites on 07/06/2017 14:01:57 [name] ([id number]) -------------------------------------------------------------------------------- Encounter Discharge Information Details Patient Name: Charpentier, Sandeep A. Date of Service: 07/06/2017 1:15 PM Medical Record Number: [id number] Patient Account Number: [id number] Date of Birth/Sex: 1965/09/16 (52 y.o. M) Treating RN: Phillis Haggis Primary Care Clarion Mooneyhan: Azucena Cecil, DAVID Other  Clinician: Referring Seibert Keeter: Azucena Cecil, DAVID Treating Rayburn Mundis/Extender: Kathreen Cosier in Treatment: 4 Encounter Discharge Information Items Discharge Condition: Stable Ambulatory Status: Wheelchair Discharge Destination: Home Transportation: Private Auto Accompanied By: mother Schedule Follow-up Appointment: Yes Clinical Summary of Care: Electronic Signature(s) Signed: 07/06/2017 4:48:34 PM By: Alejandro Mulling Entered By: Alejandro Mulling on 07/06/2017 14:04:49 Easton, Worthy Best ([id number]) -------------------------------------------------------------------------------- Lower Extremity Assessment Details Patient Name: Kall, Corey A. Date of Service: 07/06/2017 1:15 PM Medical Record Number: [id number] Patient Account Number: [id number] Date of Birth/Sex: Nov 20, 1965 (52 y.o. M) Treating RN: Huel Coventry Primary Care Presli Fanguy: Azucena Cecil, DAVID Other Clinician: Referring Sherece Gambrill: Azucena Cecil, DAVID Treating Zyairah Wacha/Extender: Kathreen Cosier in Treatment: 4 Electronic Signature(s) Signed: 07/10/2017 3:21:59 PM By: Elliot Gurney, BSN, RN, CWS, [name] RN, BSN Entered By: Elliot Gurney, BSN, RN, CWS, [name] on 07/06/2017 13:28:47 Bryner, Worthy Best ([id number]) -------------------------------------------------------------------------------- Multi Wound Chart Details Patient Name: Ikner, Chukwudi A. Date of Service: 07/06/2017 1:15 PM Medical Record Number: [id number] Patient Account Number: [id number] Date of Birth/Sex: 26-Jan-1966 (52 y.o. M) Treating RN: Curtis Sites Primary Care Lawsyn Heiler: Azucena Cecil, DAVID Other Clinician: Referring Mada Sadik: Azucena Cecil, DAVID Treating Renesha Lizama/Extender: Kathreen Cosier in Treatment: 4 Vital Signs Height(in): Pulse(bpm): 95 Weight(lbs): Blood Pressure(mmHg): 147/75 Body Mass Index(BMI): Temperature(F): 98.1 Respiratory Rate 16 (breaths/min): Photos: [4:No Photos] [5:No Photos] [N/A:N/A] Wound Location: [4:Right Trochanter] [5:Right Upper Leg - Posterior]  [N/A:N/A] Wounding Event: [4:Pressure Injury] [5:Trauma] [N/A:N/A] Primary Etiology: [4:Pressure Ulcer] [5:Trauma, Other] [N/A:N/A] Comorbid History: [4:Hypertension, Type II Diabetes, History of pressure wounds, Quadriplegia] [5:Hypertension, Type II Diabetes, History of pressure wounds, Quadriplegia] [N/A:N/A] Date Acquired: [4:04/09/2017] [5:06/21/2017] [N/A:N/A] Weeks of Treatment: [4:4] [5:2] [N/A:N/A] Wound Status: [4:Open] [5:Open] [N/A:N/A] Measurements L x W x D [4:1.2x0.7x0.1] [5:0.5x0.3x0.1] [N/A:N/A] (cm) Area (cm) : [4:0.66] [5:0.118] [N/A:N/A] Volume (cm) : [4:0.066] [5:0.012] [N/A:N/A] % Reduction in Area: [4:-27.40%] [5:-280.60%] [N/A:N/A] % Reduction in Volume: [  4:-26.90%] [5:-300.00%] [N/A:N/A] Classification: [4:Category/Stage III] [5:Partial Thickness] [N/A:N/A] Exudate Amount: [4:Medium] [5:Medium] [N/A:N/A] Exudate Type: [4:Serous] [5:Serous] [N/A:N/A] Exudate Color: [4:amber] [5:amber] [N/A:N/A] Wound Margin: [4:Flat and Intact] [5:Flat and Intact] [N/A:N/A] Granulation Amount: [4:Small (1-33%)] [5:Large (67-100%)] [N/A:N/A] Granulation Quality: [4:Red] [5:Red] [N/A:N/A] Necrotic Amount: [4:Large (67-100%)] [5:None Present (0%)] [N/A:N/A] Exposed Structures: [4:Fat Layer (Subcutaneous Tissue) Exposed: Yes Fascia: No Tendon: No Muscle: No Joint: No Bone: No] [5:Fascia: No Fat Layer (Subcutaneous Tissue) Exposed: No Tendon: No Muscle: No Joint: No Bone: No] [N/A:N/A] Epithelialization: [4:Medium (34-66%)] [5:Medium (34-66%)] [N/A:N/A] Periwound Skin Texture: [4:Scarring: Yes Excoriation: No Induration: No Callus: No] [5:Scarring: Yes Excoriation: No Induration: No Callus: No] [N/A:N/A] Crepitus: No Crepitus: No Rash: No Rash: No Periwound Skin Moisture: Maceration: No Maceration: No N/A Dry/Scaly: No Dry/Scaly: No Periwound Skin Color: Ecchymosis: Yes Atrophie Blanche: No N/A Atrophie Blanche: No Cyanosis: No Cyanosis: No Ecchymosis: No Erythema:  No Erythema: No Hemosiderin Staining: No Hemosiderin Staining: No Mottled: No Mottled: No Pallor: No Pallor: No Rubor: No Rubor: No Temperature: No Abnormality No Abnormality N/A Tenderness on Palpation: No No N/A Wound Preparation: Ulcer Cleansing: Ulcer Cleansing: N/A Rinsed/Irrigated with Saline Rinsed/Irrigated with Saline Topical Anesthetic Applied: Topical Anesthetic Applied: Other: lidocaine 4% Other: lidocaine 4% Treatment Notes Electronic Signature(s) Signed: 07/06/2017 4:22:23 PM By: Curtis Sitesorthy, Joanna Entered By: Curtis Sitesorthy, Joanna on 07/06/2017 13:49:07 Relph, Worthy [name] ([id number]) -------------------------------------------------------------------------------- Multi-Disciplinary Care Plan Details Patient Name: Leano, Giovani A. Date of Service: 07/06/2017 1:15 PM Medical Record Number: [id number] Patient Account Number: [id number] Date of Birth/Sex: 07/16/1965 (52 y.o. M) Treating RN: Curtis Sitesorthy, Joanna Primary Care Emillia Weatherly: Azucena CecilSWAYNE, DAVID Other Clinician: Referring Adaliah Hiegel: Azucena CecilSWAYNE, DAVID Treating Marvion Bastidas/Extender: Kathreen Cosieroulter, Leah Weeks in Treatment: 4 Active Inactive ` Abuse / Safety / Falls / Self Care Management Nursing Diagnoses: Potential for falls Goals: Patient will not experience any injury related to falls Date Initiated: 06/07/2017 Target Resolution Date: 10/06/2017 Goal Status: Active Interventions: Assess Activities of Daily Living upon admission and as needed Assess fall risk on admission and as needed Assess: immobility, friction, shearing, incontinence upon admission and as needed Assess impairment of mobility on admission and as needed per policy Assess personal safety and home safety (as indicated) on admission and as needed Notes: ` Nutrition Nursing Diagnoses: Imbalanced nutrition Impaired glucose control: actual or potential Potential for alteratiion in Nutrition/Potential for imbalanced nutrition Goals: Patient/caregiver agrees to and verbalizes  understanding of need to use nutritional supplements and/or vitamins as prescribed Date Initiated: 06/07/2017 Target Resolution Date: 09/08/2017 Goal Status: Active Patient/caregiver will maintain therapeutic glucose control Date Initiated: 06/07/2017 Target Resolution Date: 10/06/2017 Goal Status: Active Interventions: Assess patient nutrition upon admission and as needed per policy Provide education on elevated blood sugars and impact on wound healing Notes: ` Jaso, Worthy RancherUL A. ([id number]) Orientation to the Wound Care Program Nursing Diagnoses: Knowledge deficit related to the wound healing center program Goals: Patient/caregiver will verbalize understanding of the Wound Healing Center Program Date Initiated: 06/07/2017 Target Resolution Date: 07/07/2017 Goal Status: Active Interventions: Provide education on orientation to the wound center Notes: ` Pressure Nursing Diagnoses: Knowledge deficit related to causes and risk factors for pressure ulcer development Knowledge deficit related to management of pressures ulcers Potential for impaired tissue integrity related to pressure, friction, moisture, and shear Goals: Patient will remain free from development of additional pressure ulcers Date Initiated: 06/07/2017 Target Resolution Date: 09/08/2017 Goal Status: Active Interventions: Assess: immobility, friction, shearing, incontinence upon admission and as needed Assess offloading mechanisms upon admission and as needed Assess potential for pressure ulcer  upon admission and as needed Provide education on pressure ulcers Notes: ` Wound/Skin Impairment Nursing Diagnoses: Impaired tissue integrity Knowledge deficit related to ulceration/compromised skin integrity Goals: Ulcer/skin breakdown will have a volume reduction of 80% by week 12 Date Initiated: 06/07/2017 Target Resolution Date: 09/29/2017 Goal Status: Active Interventions: Assess patient/caregiver ability to perform  ulcer/skin care regimen upon admission and as needed Assess ulceration(s) every visit Notes: LEBRON, NAUERT ([id number]) Electronic Signature(s) Signed: 07/06/2017 4:22:23 PM By: Curtis Sites Entered By: Curtis Sites on 07/06/2017 13:48:58 Zapanta, Worthy Best ([id number]) -------------------------------------------------------------------------------- Pain Assessment Details Patient Name: Roskelley, Ohn A. Date of Service: 07/06/2017 1:15 PM Medical Record Number: [id number] Patient Account Number: [id number] Date of Birth/Sex: January 31, 1965 (52 y.o. M) Treating RN: Huel Coventry Primary Care Geetika Laborde: Azucena Cecil, DAVID Other Clinician: Referring Jennavecia Schwier: Azucena Cecil, DAVID Treating Zalea Pete/Extender: Kathreen Cosier in Treatment: 4 Active Problems Location of Pain Severity and Description of Pain Patient Has Paino No Site Locations Pain Management and Medication Current Pain Management: Electronic Signature(s) Signed: 07/10/2017 3:21:59 PM By: Elliot Gurney, BSN, RN, CWS, [name] RN, BSN Entered By: Elliot Gurney, BSN, RN, CWS, [name] on 07/06/2017 13:18:52 Garver, Worthy Best ([id number]) -------------------------------------------------------------------------------- Patient/Caregiver Education Details Patient Name: Vallee, Etai A. Date of Service: 07/06/2017 1:15 PM Medical Record Number: [id number] Patient Account Number: [id number] Date of Birth/Gender: October 24, 1965 (52 y.o. M) Treating RN: Phillis Haggis Primary Care Physician: Azucena Cecil, DAVID Other Clinician: Referring Physician: Azucena Cecil, DAVID Treating Physician/Extender: Kathreen Cosier in Treatment: 4 Education Assessment Education Provided To: Patient Education Topics Provided Wound/Skin Impairment: Handouts: Caring for Your Ulcer, Skin Care Do's and Dont's, Other: change dressing as ordered Methods: Demonstration, Explain/Verbal Responses: State content correctly Electronic Signature(s) Signed: 07/06/2017 4:48:34 PM By: Alejandro Mulling Entered By:  Alejandro Mulling on 07/06/2017 14:05:06 Retherford, Worthy Best ([id number]) -------------------------------------------------------------------------------- Wound Assessment Details Patient Name: Vroom, Anis A. Date of Service: 07/06/2017 1:15 PM Medical Record Number: [id number] Patient Account Number: [id number] Date of Birth/Sex: 1966-01-14 (52 y.o. M) Treating RN: Huel Coventry Primary Care Zaedyn Covin: Azucena Cecil, DAVID Other Clinician: Referring Gayle Martinez: Azucena Cecil, DAVID Treating Elesha Thedford/Extender: Kathreen Cosier in Treatment: 4 Wound Status Wound Number: 4 Primary Pressure Ulcer Etiology: Wound Location: Right Trochanter Wound Open Wounding Event: Pressure Injury Status: Date Acquired: 04/09/2017 Comorbid Hypertension, Type II Diabetes, History of Weeks Of Treatment: 4 History: pressure wounds, Quadriplegia Clustered Wound: No Photos Photo Uploaded By: Elliot Gurney, BSN, RN, CWS, [name] on 07/06/2017 15:59:47 Wound Measurements Length: (cm) 1.2 Width: (cm) 0.7 Depth: (cm) 0.1 Area: (cm) 0.66 Volume: (cm) 0.066 % Reduction in Area: -27.4% % Reduction in Volume: -26.9% Epithelialization: Medium (34-66%) Tunneling: No Undermining: No Wound Description Classification: Category/Stage III Foul Odor Wound Margin: Flat and Intact Slough/Fi Exudate Amount: Medium Exudate Type: Serous Exudate Color: amber After Cleansing: No brino Yes Wound Bed Granulation Amount: Small (1-33%) Exposed Structure Granulation Quality: Red Fascia Exposed: No Necrotic Amount: Large (67-100%) Fat Layer (Subcutaneous Tissue) Exposed: Yes Necrotic Quality: Adherent Slough Tendon Exposed: No Muscle Exposed: No Joint Exposed: No Bone Exposed: No Periwound Skin Texture Hopping, Sakai A. ([id number]) Texture Color No Abnormalities Noted: No No Abnormalities Noted: No Callus: No Atrophie Blanche: No Crepitus: No Cyanosis: No Excoriation: No Ecchymosis: Yes Induration: No Erythema: No Rash:  No Hemosiderin Staining: No Scarring: Yes Mottled: No Pallor: No Moisture Rubor: No No Abnormalities Noted: No Dry / Scaly: No Temperature / Pain Maceration: No Temperature: No Abnormality Wound Preparation Ulcer Cleansing: Rinsed/Irrigated with Saline Topical Anesthetic Applied: Other: lidocaine 4%, Treatment Notes Wound #4 (Right Trochanter) 1. Cleansed with: Clean wound  with Normal Saline 2. Anesthetic Topical Lidocaine 4% cream to wound bed prior to debridement 5. Secondary Dressing Applied Bordered Foam Dressing Electronic Signature(s) Signed: 07/06/2017 4:22:23 PM By: Curtis Sites Signed: 07/10/2017 3:21:59 PM By: Elliot Gurney, BSN, RN, CWS, [name] RN, BSN Entered By: Curtis Sites on 07/06/2017 13:53:07 Vanderheiden, Worthy Best ([id number]) -------------------------------------------------------------------------------- Wound Assessment Details Patient Name: Levings, Wake A. Date of Service: 07/06/2017 1:15 PM Medical Record Number: [id number] Patient Account Number: [id number] Date of Birth/Sex: 02-25-1965 (53 y.o. M) Treating RN: Huel Coventry Primary Care Corsica Franson: Azucena Cecil, DAVID Other Clinician: Referring Jahmad Petrich: Azucena Cecil, DAVID Treating Christyn Gutkowski/Extender: Kathreen Cosier in Treatment: 4 Wound Status Wound Number: 5 Primary Trauma, Other Etiology: Wound Location: Right Upper Leg - Posterior Wound Open Wounding Event: Trauma Status: Date Acquired: 06/21/2017 Comorbid Hypertension, Type II Diabetes, History of Weeks Of Treatment: 2 History: pressure wounds, Quadriplegia Clustered Wound: No Photos Photo Uploaded By: Elliot Gurney, BSN, RN, CWS, [name] on 07/06/2017 16:00:08 Wound Measurements Length: (cm) 0.5 Width: (cm) 0.3 Depth: (cm) 0.1 Area: (cm) 0.118 Volume: (cm) 0.012 % Reduction in Area: -280.6% % Reduction in Volume: -300% Epithelialization: Medium (34-66%) Tunneling: No Undermining: No Wound Description Classification: Partial Thickness Foul Odor Wound Margin:  Flat and Intact Slough/Fi Exudate Amount: Medium Exudate Type: Serous Exudate Color: amber After Cleansing: No brino No Wound Bed Granulation Amount: Large (67-100%) Exposed Structure Granulation Quality: Red Fascia Exposed: No Necrotic Amount: None Present (0%) Fat Layer (Subcutaneous Tissue) Exposed: No Tendon Exposed: No Muscle Exposed: No Joint Exposed: No Bone Exposed: No Periwound Skin Texture Weathersby, Garland A. ([id number]) Texture Color No Abnormalities Noted: No No Abnormalities Noted: No Callus: No Atrophie Blanche: No Crepitus: No Cyanosis: No Excoriation: No Ecchymosis: No Induration: No Erythema: No Rash: No Hemosiderin Staining: No Scarring: Yes Mottled: No Pallor: No Moisture Rubor: No No Abnormalities Noted: No Dry / Scaly: No Temperature / Pain Maceration: No Temperature: No Abnormality Wound Preparation Ulcer Cleansing: Rinsed/Irrigated with Saline Topical Anesthetic Applied: Other: lidocaine 4%, Electronic Signature(s) Signed: 07/10/2017 3:21:59 PM By: Elliot Gurney, BSN, RN, CWS, [name] RN, BSN Entered By: Elliot Gurney, BSN, RN, CWS, [name] on 07/06/2017 13:29:52 Walko, Worthy Best ([id number]) -------------------------------------------------------------------------------- Wound Assessment Details Patient Name: Montour, Abishai A. Date of Service: 07/06/2017 1:15 PM Medical Record Number: [id number] Patient Account Number: [id number] Date of Birth/Sex: 1965/12/28 (52 y.o. M) Treating RN: Curtis Sites Primary Care Jaleena Viviani: Azucena Cecil, DAVID Other Clinician: Referring Geovani Tootle: Azucena Cecil, DAVID Treating Kennya Schwenn/Extender: Kathreen Cosier in Treatment: 4 Wound Status Wound Number: 6 Primary Pressure Ulcer Etiology: Wound Location: Left Ischium Wound Open Wounding Event: Trauma Status: Date Acquired: 07/06/2017 Comorbid Hypertension, Type II Diabetes, History of Weeks Of Treatment: 0 History: pressure wounds, Quadriplegia Clustered Wound: No Photos Photo Uploaded  By: Elliot Gurney, BSN, RN, CWS, [name] on 07/06/2017 16:00:25 Wound Measurements Length: (cm) 0.2 Width: (cm) 0.2 Depth: (cm) 0.1 Area: (cm) 0.031 Volume: (cm) 0.003 % Reduction in Area: % Reduction in Volume: Epithelialization: Large (67-100%) Tunneling: No Undermining: No Wound Description Classification: Category/Stage II Foul Odor Wound Margin: Flat and Intact Slough/Fi Exudate Amount: Medium Exudate Type: Serous Exudate Color: amber After Cleansing: No brino No Wound Bed Granulation Amount: Large (67-100%) Exposed Structure Granulation Quality: Red Fascia Exposed: No Necrotic Amount: None Present (0%) Fat Layer (Subcutaneous Tissue) Exposed: No Tendon Exposed: No Muscle Exposed: No Joint Exposed: No Bone Exposed: No Periwound Skin Texture Krakow, Donyea A. ([id number]) Texture Color No Abnormalities Noted: No No Abnormalities Noted: No Callus: No Atrophie Blanche: No Crepitus: No Cyanosis: No Excoriation: No Ecchymosis: No Induration: No  Erythema: No Rash: No Hemosiderin Staining: No Scarring: No Mottled: No Pallor: No Moisture Rubor: No No Abnormalities Noted: No Dry / Scaly: No Maceration: No Wound Preparation Ulcer Cleansing: Rinsed/Irrigated with Saline Topical Anesthetic Applied: None Treatment Notes Wound #6 (Left Ischium) 1. Cleansed with: Clean wound with Normal Saline 2. Anesthetic Topical Lidocaine 4% cream to wound bed prior to debridement 5. Secondary Dressing Applied Bordered Foam Dressing Electronic Signature(s) Signed: 07/06/2017 4:22:23 PM By: Curtis Sites Entered By: Curtis Sites on 07/06/2017 13:54:03 Penman, Worthy Best ([id number]) -------------------------------------------------------------------------------- Vitals Details Patient Name: Shapley, Jeferson A. Date of Service: 07/06/2017 1:15 PM Medical Record Number: [id number] Patient Account Number: [id number] Date of Birth/Sex: [birth year] (52 y.o. M) Treating RN: Huel Coventry Primary  Care Shamere Campas: Azucena Cecil, DAVID Other Clinician: Referring Thimothy Barretta: Azucena Cecil, DAVID Treating Zev Blue/Extender: Kathreen Cosier in Treatment: 4 Vital Signs Time Taken: 13:18 Temperature (F): 98.1 Pulse (bpm): 95 Respiratory Rate (breaths/min): 16 Blood Pressure (mmHg): 147/75 Reference Range: 80 - 120 mg / dl Electronic Signature(s) Signed: 07/10/2017 3:21:59 PM By: Elliot Gurney, BSN, RN, CWS, [name] RN, BSN Entered By: Elliot Gurney, BSN, RN, CWS, [name] on 07/06/2017 13:19:13

## 2017-07-20 ENCOUNTER — Encounter: Payer: Medicare Other | Admitting: Physician Assistant

## 2017-07-20 DIAGNOSIS — L89322 Pressure ulcer of left buttock, stage 2: Secondary | ICD-10-CM | POA: Diagnosis not present

## 2017-07-23 NOTE — Progress Notes (Signed)
Rowe RobertMOFFITT, Ashvik A. ([id number]) Visit Report for 07/20/2017 Chief Complaint Document Details Patient Name: [name], [name] FickleUL A. Date of Service: 07/20/2017 1:30 PM Medical Record Number: [id number] Patient Account Number: [id number] Date of Birth/Sex: [birth year] (52 y.o. M) Treating RN: Curtis Sitesorthy, Joanna Primary Care Provider: Azucena CecilSWAYNE, DAVID Other Clinician: Referring Provider: Azucena CecilSWAYNE, DAVID Treating Provider/Extender: Linwood DibblesSTONE III, HOYT Weeks in Treatment: 6 Information Obtained from: Patient Chief Complaint multiple ulcers/wounds Electronic Signature(s) Signed: 07/23/2017 12:55:36 AM By: Lenda KelpStone III, Hoyt PA-C Entered By: Lenda KelpStone III, Hoyt on 07/20/2017 13:38:24 Alberico, Worthy [name] ([id number]) -------------------------------------------------------------------------------- HPI Details Patient Name: [name], [name] Date of Service: 07/20/2017 1:30 PM Medical Record Number: [id number] Patient Account Number: [id number] Date of Birth/Sex: [birth year] (52 y.o. M) Treating RN: Curtis Sitesorthy, Joanna Primary Care Provider: Azucena CecilSWAYNE, DAVID Other Clinician: Referring Provider: Azucena CecilSWAYNE, DAVID Treating Provider/Extender: Linwood DibblesSTONE III, HOYT Weeks in Treatment: 6 History of Present Illness HPI Description: 06/07/17 patient presents today for initial evaluation and our clinic regarding a new wound although he has previously been a patient here for similar issues. He has muscular dystrophy and secondary to this significant weakness and contractures of his lower extremities which therefore does tend to cause him to have recurrent ulcers on his gluteal, sacral, Ischial location. Right now he actually has a right trochanter wound which has been present for several weeks and according to his mother who does take care of them and in my pinion does an excellent job this has gotten better but still she was concerned about getting it to completely close so it does not worsened. Patient does have diabetes and his hemoglobin A1c is 8.0 patient  did see his primary care provider, Alanson PulsPaul Moffat, on 06/06/17 according to the after visit summary nothing was really addressed in regard to the wounds at that time however. Other than diabetes the patient is blind, have hypertension, and muscular dystrophy obviously. 06/21/17-he is here in follow-up evaluation for a right trochanter pressure injury he presents with a new area of skin stripping to a healed/scarred right ischium. he is able to calm every 2 weeks secondary to transportation needs. He tolerated debridement, we will continue with collagen and he will follow-up in 2 weeks 07/06/17-He is here in follow-up evaluation, accompanied by his mother, for evaluation of right trochanter, right ischium ulcers with a new area of concern to the left fascia. All areas are superficial at today's visit. We will protect him with foam border. We will order for nu-motion to evaluate chair cushion; the cushion in the chair he spends most of his time on is "decades" old and the chair is his newer motorized chair is "too hard". He will follow up in two week 07/20/17 on evaluation today patient appears to be doing rather well in regard to his gluteal ulcers. In fact everything at this point on evaluation today appears to be completely healed which is excellent news. He's been tolerating the dressing changes without any complications. Unfortunately after the last time he was here his wife actually sustained a heart attack fortunately she is here today and seems to be doing fairly well she still is taking excellent care of him. Nonetheless this is an additional stressor on their lives right now. Fortunately him healing I think will be of benefit in relieving some of that stress. Electronic Signature(s) Signed: 07/23/2017 12:55:36 AM By: Lenda KelpStone III, Hoyt PA-C Entered By: Lenda KelpStone III, Hoyt on 07/20/2017 14:25:15 Ell, Worthy [name]  ([id number]) -------------------------------------------------------------------------------- Physical Exam Details Patient Name: [name], [name] Date of Service: 07/20/2017 1:30 PM  Medical Record Number: [id number] Patient Account Number: [id number] Date of Birth/Sex: October 23, 1965 (52 y.o. M) Treating RN: Curtis Sites Primary Care Provider: Azucena Cecil, DAVID Other Clinician: Referring Provider: Azucena Cecil, DAVID Treating Provider/Extender: STONE III, HOYT Weeks in Treatment: 6 Constitutional Well-nourished and well-hydrated in no acute distress. Respiratory normal breathing without difficulty. Psychiatric this patient is able to make decisions and demonstrates good insight into disease process. Alert and Oriented x 3. pleasant and cooperative. Notes Patient's wounds all appear to have resolved with no residual openings and no discomfort. Overall I'm very pleased with the progress that he's made in such a short time. I do think he's offloading appropriately which is also good news. Electronic Signature(s) Signed: 07/23/2017 12:55:36 AM By: Lenda Kelp PA-C Entered By: Lenda Kelp on 07/20/2017 14:25:55 Transue, [name] ([id number]) -------------------------------------------------------------------------------- Physician Orders Details Patient Name: [name], Best A. Date of Service: 07/20/2017 1:30 PM Medical Record Number: [id number] Patient Account Number: [id number] Date of Birth/Sex: Sep 12, 1965 (52 y.o. 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, unspecified E11.622 Type 2 diabetes mellitus with other skin ulcer E66.3 Overweight S31.819S Unspecified open wound of right buttock,  sequela L89.312 Pressure ulcer of right buttock, stage 2 L89.322 Pressure ulcer of left buttock, stage 2 Discharge From [facility] o Discharge from Wound Care Center Electronic Signature(s) Signed: 07/20/2017 4:49:47 PM By: Curtis Sites Signed: 07/23/2017 12:55:36 AM By: Lenda Kelp PA-C Entered By: Curtis Sites on 07/20/2017 14:04:53 Axley, [name] ([id number]) -------------------------------------------------------------------------------- Problem List Details Patient Name: Boulanger, Sael A. Date of Service: 07/20/2017 1:30 PM Medical Record Number: [id number] Patient Account Number: [id number] Date of Birth/Sex: [birth year] (52 y.o. 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, unspecified 06/08/2017 No Yes E11.622 Type 2 diabetes mellitus with other skin ulcer 06/08/2017 No Yes E66.3 Overweight 06/08/2017 No Yes S31.819S Unspecified open wound of right buttock, sequela 06/21/2017 No Yes L89.312 Pressure ulcer of right buttock, stage 2 07/06/2017 No Yes L89.322 Pressure ulcer of left buttock, stage 2 07/06/2017 No Yes Inactive Problems Resolved Problems Electronic Signature(s) Signed: 07/23/2017 12:55:36 AM By: Lenda Kelp PA-C Entered By: Lenda Kelp on 07/20/2017 13:38:18 Southard, [name] ([id number]) -------------------------------------------------------------------------------- Progress Note Details Patient Name: Oki, Emeterio A. Date of Service: 07/20/2017 1:30 PM Medical Record Number: [id number] Patient Account Number: [id number] Date of Birth/Sex: [birth year] (52 y.o. M) Treating RN: Curtis Sites Primary Care Provider: Azucena Cecil, DAVID Other  Clinician: Referring Provider: Azucena Cecil, DAVID Treating Provider/Extender: Linwood Dibbles, HOYT Weeks in Treatment: 6 Subjective Chief Complaint Information obtained from Patient multiple ulcers/wounds History of Present Illness (HPI) 06/07/17 patient presents today for initial evaluation and our clinic regarding a new wound although he has previously been a patient here for similar issues. He has muscular dystrophy and secondary to this significant weakness and contractures of his lower extremities which therefore does tend to cause him to have recurrent ulcers on his gluteal, sacral, Ischial location. Right now he actually has a right trochanter wound which has been present for several weeks and according to his  mother who does take care of them and in my pinion does an excellent job this has gotten better but still she was concerned about getting it to completely close so it does not worsened. Patient does have diabetes and his hemoglobin A1c is 8.0 patient did see his primary care provider, Alanson Puls, on 06/06/17 according to the after visit summary nothing was really addressed in regard to the wounds at that time however. Other than diabetes the patient is blind, have hypertension, and muscular dystrophy obviously. 06/21/17-he is here in follow-up evaluation for a right trochanter pressure injury he presents with a new area of skin stripping to a healed/scarred right ischium. he is able to calm every 2 weeks secondary to transportation needs. He tolerated debridement, we will continue with collagen and he will follow-up in 2 weeks 07/06/17-He is here in follow-up evaluation, accompanied by his mother, for evaluation of right trochanter, right ischium ulcers with a new area of concern to the left fascia. All areas are superficial at today's visit. We will protect him with foam border. We will order for nu-motion to evaluate chair cushion; the cushion in the chair he spends most of his time on is  "decades" old and the chair is his newer motorized chair is "too hard". He will follow up in two week 07/20/17 on evaluation today patient appears to be doing rather well in regard to his gluteal ulcers. In fact everything at this point on evaluation today appears to be completely healed which is excellent news. He's been tolerating the dressing changes without any complications. Unfortunately after the last time he was here his wife actually sustained a heart attack fortunately she is here today and seems to be doing fairly well she still is taking excellent care of him. Nonetheless this is an additional stressor on their lives right now. Fortunately him healing I think will be of benefit in relieving some of that stress. Patient History Information obtained from Patient. Family History Cancer - Maternal Grandparents, Diabetes - Mother,Father, Heart Disease - Father, Hypertension - Mother,Father, Thyroid Problems - Mother, No family history of Hereditary Spherocytosis, Kidney Disease, Lung Disease, Seizures, Stroke, Tuberculosis. Social History Never smoker, Marital Status - Single, Alcohol Use - Never - HX, Drug Use - No History, Caffeine Use - Daily. 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 Objective Constitutional Well-nourished and well-hydrated in no acute distress. Vitals Time Taken: 1:30 PM, Temperature: 98.2 F, Pulse: 98 bpm, Respiratory Rate: 16 breaths/min, Blood Pressure: 153/82 mmHg. Respiratory normal breathing without difficulty. Psychiatric this patient is able to make decisions and demonstrates good insight into disease process. Alert and Oriented x 3. pleasant and cooperative. General Notes: Patient's  wounds all appear to have resolved with no residual openings and no discomfort. Overall I'm very pleased with the progress that he's made in such a short time. I do think he's offloading appropriately which is also good news. Integumentary (Hair, Skin) Wound #4 status is Healed - Epithelialized. Original cause of wound was Pressure Injury. The wound is located on the Right Trochanter. The wound measures 0cm length x 0cm width x 0cm depth; 0cm^2 area and 0cm^3 volume. There is no tunneling or undermining noted. There is a none present amount of drainage noted. The wound margin is flat and intact. There is no granulation within the wound bed.  There is no necrotic tissue within the wound bed. The periwound skin appearance did not exhibit: Callus, Crepitus, Excoriation, Induration, Rash, Scarring, Dry/Scaly, Maceration, Atrophie Blanche, Cyanosis, Ecchymosis, Hemosiderin Staining, Mottled, Pallor, Rubor, Erythema. Periwound temperature was noted as No Abnormality. Wound #5 status is Healed - Epithelialized. Original cause of wound was Trauma. The wound is located on the Right,Posterior Ischium. The wound measures 0cm length x 0cm width x 0cm depth; 0cm^2 area and 0cm^3 volume. There is no tunneling or undermining noted. There is a none present amount of drainage noted. The wound margin is flat and intact. There is no granulation within the wound bed. There is no necrotic tissue within the wound bed. The periwound skin appearance did not exhibit: Callus, Crepitus, Excoriation, Induration, Rash, Scarring, Dry/Scaly, Maceration, Atrophie Blanche, Cyanosis, Ecchymosis, Hemosiderin Staining, Mottled, Pallor, Rubor, Erythema. Periwound temperature was noted as No Abnormality. Wound #6 status is Healed - Epithelialized. Original cause of wound was Trauma. The wound is located on the Left Ischium. The wound measures 0cm length x 0cm width x 0cm depth; 0cm^2 area and 0cm^3 volume. There is no tunneling  or undermining noted. There is a none present amount of drainage noted. The wound margin is flat and intact. There is no Rubel, Wilborn A. ([id number]) granulation within the wound bed. There is no necrotic tissue within the wound bed. The periwound skin appearance did not exhibit: Callus, Crepitus, Excoriation, Induration, Rash, Scarring, Dry/Scaly, Maceration, Atrophie Blanche, Cyanosis, Ecchymosis, Hemosiderin Staining, Mottled, Pallor, Rubor, Erythema. Assessment Active Problems ICD-10 Pressure ulcer of right hip, stage 3 Motor neuron disease, unspecified Type 2 diabetes mellitus with other skin ulcer Overweight Unspecified open wound of right buttock, sequela Pressure ulcer of right buttock, stage 2 Pressure ulcer of left buttock, stage 2 Plan Discharge From [facility]: Discharge from Wound Care Center At this point will discontinue wound care services although I did recommend that the patient continued to offload as he has been since this has been very effective in helping the wounds to heal. They will continue to use the protective cover dressing is changing every few days as well in order to again protect the newly healed regions of tissue. Otherwise we will see back for reevaluation in the future if anything changes or worsens. Electronic Signature(s) Signed: 07/23/2017 12:55:36 AM By: Lenda Kelp PA-C Entered By: Lenda Kelp on 07/20/2017 14:26:19 Hollingshed, [name] ([id number]) -------------------------------------------------------------------------------- ROS/PFSH Details Patient Name: Gadson, Fidencio A. Date of Service: 07/20/2017 1:30 PM Medical Record Number: [id number] Patient Account Number: [id number] Date of Birth/Sex: Jun 21, 1965 (52 y.o. M) Treating RN: Curtis Sites Primary Care Provider: Azucena Cecil, DAVID Other Clinician: Referring Provider: Azucena Cecil, DAVID Treating Provider/Extender: Linwood Dibbles, HOYT Weeks in Treatment: 6 Information Obtained  From Patient Wound History Do you currently have one or more open woundso Yes How many open wounds do you currently haveo 3 Approximately how long have you had your woundso 2 months How have you been treating your wound(s) until nowo aquacel Has your wound(s) ever healed and then re-openedo No Have you had any lab work done in the past montho No Have you tested positive for an antibiotic resistant organism (MRSA, VRE)o No Have you tested positive for osteomyelitis (bone infection)o No Have you had any tests for circulation on your legso No Eyes Medical History: Negative for: Cataracts; Glaucoma; Optic Neuritis Past Medical History Notes: stigmatism Ear/Nose/Mouth/Throat Medical History: Negative for: Chronic sinus problems/congestion; Middle ear problems Hematologic/Lymphatic Medical History: Negative for: Anemia; Hemophilia; Human Immunodeficiency Virus; Lymphedema; Sickle  Cell Disease Respiratory Medical History: Negative for: Aspiration; Asthma; Chronic Obstructive Pulmonary Disease (COPD); Pneumothorax; Sleep Apnea; Tuberculosis Cardiovascular Medical History: Positive for: Hypertension Negative for: Angina; Arrhythmia; Congestive Heart Failure; Coronary Artery Disease; Deep Vein Thrombosis; Hypotension; Myocardial Infarction; Peripheral Arterial Disease; Peripheral Venous Disease; Phlebitis; Vasculitis Past Medical History Notes: hypercholesterolemia Gastrointestinal Medical History: Negative for: Cirrhosis ; Colitis; Crohnos; Hepatitis A; Hepatitis B; Hepatitis C Past Medical History Notes: Nasworthy, AMEDEO DETWEILER ([id number]) GERD Endocrine Medical History: Positive for: Type II Diabetes Time with diabetes: 2005 Treated with: Insulin, Oral agents Blood sugar tested every day: No Genitourinary Medical History: Negative for: End Stage Renal Disease Immunological Medical History: Negative for: Lupus Erythematosus; Raynaudos; Scleroderma Past Medical History  Notes: Muscular Distrophy Integumentary (Skin) Medical History: Positive for: History of pressure wounds Negative for: History of Burn Musculoskeletal Medical History: Negative for: Gout; Rheumatoid Arthritis; Osteoarthritis; Osteomyelitis Past Medical History Notes: Muscular Distrophy, SMA type 3 Neurologic Medical History: Positive for: Quadriplegia Negative for: Dementia; Neuropathy; Paraplegia; Seizure Disorder Oncologic Medical History: Negative for: Received Chemotherapy; Received Radiation Psychiatric Complaints and Symptoms: No Complaints or Symptoms Medical History: Negative for: Anorexia/bulimia; Confinement Anxiety Immunizations Pneumococcal Vaccine: Received Pneumococcal Vaccination: Yes Dittmer, STEFANO TRULSON ([id number]) Implantable Devices Family and Social History Cancer: Yes - Maternal Grandparents; Diabetes: Yes - Mother,Father; Heart Disease: Yes - Father; Hereditary Spherocytosis: No; Hypertension: Yes - Mother,Father; Kidney Disease: No; Lung Disease: No; Seizures: No; Stroke: No; Thyroid Problems: Yes - Mother; Tuberculosis: No; Never smoker; Marital Status - Single; Alcohol Use: Never - HX; Drug Use: No History; Caffeine Use: Daily; Financial Concerns: No; Food, Clothing or Shelter Needs: No; Support System Lacking: No; Transportation Concerns: No; Advanced Directives: No; Patient does not want information on Advanced Directives; Do not resuscitate: No; Living Will: No; Medical Power of Attorney: No Physician Affirmation I have reviewed and agree with the above information. Electronic Signature(s) Signed: 07/20/2017 4:49:47 PM By: Curtis Sites Signed: 07/23/2017 12:55:36 AM By: Lenda Kelp PA-C Entered By: Lenda Kelp on 07/20/2017 14:25:32 Postma, [name] -------------------------------------------------------------------------------- SuperBill Details Patient Name: Borowiak, Caisen A. Date of Service: 07/20/2017 Medical Record Number:  [id number] Patient Account Number: [id number] Date of Birth/Sex: 12/28/1965 (52 y.o. 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, unspecified E11.622 Type 2 diabetes mellitus with other skin ulcer E66.3 Overweight S31.819S Unspecified open wound of right buttock, sequela L89.312 Pressure ulcer of right buttock, stage 2 L89.322 Pressure ulcer of left buttock, stage 2 Facility Procedures CPT4 Code: [id number] Description: 99213 - WOUND CARE VISIT-LEV 3 EST PT Modifier: Quantity: 1 Physician Procedures CPT4 Code: [id number] Description: 64403 - WC PHYS LEVEL 2 - EST PT ICD-10 Diagnosis Description 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

## 2017-07-24 NOTE — Progress Notes (Signed)
LENNELL, SHANKS ([id number]) Visit Report for 07/20/2017 Arrival Information Details Patient Name: [name], [name] Date of Service: 07/20/2017 1:30 PM Medical Record Number: [id number] Patient Account Number: [id number] Date of Birth/Sex: [birth year] (52 y.o. M) Treating RN: Rema Jasmine Primary Care Hazael Olveda: Azucena Cecil, DAVID Other Clinician: Referring Daana Petrasek: Azucena Cecil, DAVID Treating Berenice Oehlert/Extender: Linwood Dibbles, HOYT Weeks in Treatment: 6 Visit Information History Since Last Visit Added or deleted any medications: No Patient Arrived: Wheel Chair Any new allergies or adverse reactions: No Arrival Time: 13:38 Had a fall or experienced change in No Accompanied By: wife activities of daily living that may affect Transfer Assistance: Michiel Sites Lift risk of falls: Patient Identification Verified: Yes Signs or symptoms of abuse/neglect since last visito No Secondary Verification Process Completed: Yes Hospitalized since last visit: No Patient Has Alerts: Yes Implantable device outside of the clinic excluding No Patient Alerts: DMII cellular tissue based products placed in the center since last visit: Pain Present Now: Yes Electronic Signature(s) Signed: 07/24/2017 11:20:16 AM By: Rema Jasmine Entered By: Rema Jasmine on 07/20/2017 13:39:54 [name], [name] ([id number]) -------------------------------------------------------------------------------- Clinic Level of Care Assessment Details Patient Name: [name], [name] Date of Service: 07/20/2017 1:30 PM Medical Record Number: [id number] Patient Account Number: [id number] Date of Birth/Sex: 1965/06/16 (52 y.o. M) Treating RN: Curtis Sites Primary Care Keirstyn Aydt: Azucena Cecil, DAVID Other Clinician: Referring Kasin Tonkinson: Azucena Cecil, DAVID Treating Souleymane Saiki/Extender: Linwood Dibbles, HOYT Weeks in Treatment: 6 Clinic Level of Care Assessment Items TOOL 4 Quantity Score []  - Use when only an EandM is performed on FOLLOW-UP visit 0 ASSESSMENTS - Nursing Assessment /  Reassessment X - Reassessment of Co-morbidities (includes updates in patient status) 1 10 X- 1 5 Reassessment of Adherence to Treatment Plan ASSESSMENTS - Wound and Skin Assessment / Reassessment []  - Simple Wound Assessment / Reassessment - one wound 0 X- 3 5 Complex Wound Assessment / Reassessment - multiple wounds []  - 0 Dermatologic / Skin Assessment (not related to wound area) ASSESSMENTS - Focused Assessment []  - Circumferential Edema Measurements - multi extremities 0 []  - 0 Nutritional Assessment / Counseling / Intervention []  - 0 Lower Extremity Assessment (monofilament, tuning fork, pulses) []  - 0 Peripheral Arterial Disease Assessment (using hand held doppler) ASSESSMENTS - Ostomy and/or Continence Assessment and Care []  - Incontinence Assessment and Management 0 []  - 0 Ostomy Care Assessment and Management (repouching, etc.) PROCESS - Coordination of Care X - Simple Patient / Family Education for ongoing care 1 15 []  - 0 Complex (extensive) Patient / Family Education for ongoing care []  - 0 Staff obtains Chiropractor, Records, Test Results / Process Orders []  - 0 Staff telephones HHA, Nursing Homes / Clarify orders / etc []  - 0 Routine Transfer to another Facility (non-emergent condition) []  - 0 Routine Hospital Admission (non-emergent condition) []  - 0 New Admissions / Manufacturing engineer / Ordering NPWT, Apligraf, etc. []  - 0 Emergency Hospital Admission (emergent condition) X- 1 10 Simple Discharge Coordination [name], [name] ([id number]) []  - 0 Complex (extensive) Discharge Coordination PROCESS - Special Needs []  - Pediatric / Minor Patient Management 0 []  - 0 Isolation Patient Management []  - 0 Hearing / Language / Visual special needs []  - 0 Assessment of Community assistance (transportation, D/C planning, etc.) []  - 0 Additional assistance / Altered mentation []  - 0 Support Surface(s) Assessment (bed, cushion, seat, etc.) INTERVENTIONS -  Wound Cleansing / Measurement []  - Simple Wound Cleansing - one wound 0 X- 3 5 Complex Wound Cleansing - multiple wounds X- 1 5 Wound Imaging (  photographs - any number of wounds) []  - 0 Wound Tracing (instead of photographs) []  - 0 Simple Wound Measurement - one wound X- 3 5 Complex Wound Measurement - multiple wounds INTERVENTIONS - Wound Dressings []  - Small Wound Dressing one or multiple wounds 0 []  - 0 Medium Wound Dressing one or multiple wounds []  - 0 Large Wound Dressing one or multiple wounds []  - 0 Application of Medications - topical []  - 0 Application of Medications - injection INTERVENTIONS - Miscellaneous []  - External ear exam 0 []  - 0 Specimen Collection (cultures, biopsies, blood, body fluids, etc.) []  - 0 Specimen(s) / Culture(s) sent or taken to Lab for analysis X- 1 10 Patient Transfer (multiple staff / Nurse, adult / Similar devices) []  - 0 Simple Staple / Suture removal (25 or less) []  - 0 Complex Staple / Suture removal (26 or more) []  - 0 Hypo / Hyperglycemic Management (close monitor of Blood Glucose) []  - 0 Ankle / Brachial Index (ABI) - do not check if billed separately X- 1 5 Vital Signs [name], [name] Has the patient been seen at the hospital within the last three years: Yes Total Score: 105 Level Of Care: New/Established - Level 3 Electronic Signature(s) Signed: 07/20/2017 4:49:47 PM By: Curtis Sites Entered By: Curtis Sites on 07/20/2017 14:12:55 [name], [name] ([id number]) -------------------------------------------------------------------------------- Lower Extremity Assessment Details Patient Name: [name], [name] Date of Service: 07/20/2017 1:30 PM Medical Record Number: [id number] Patient Account Number: [id number] Date of Birth/Sex: 1965/12/17 (52 y.o. M) Treating RN: Rema Jasmine Primary Care Abdullah Rizzi: Azucena Cecil, DAVID Other Clinician: Referring Angeleigh Chiasson: Azucena Cecil, DAVID Treating Fabricio Endsley/Extender: Linwood Dibbles,  HOYT Weeks in Treatment: 6 Electronic Signature(s) Signed: 07/24/2017 11:20:16 AM By: Rema Jasmine Entered By: Rema Jasmine on 07/20/2017 13:49:38 [name], [name] ([id number]) -------------------------------------------------------------------------------- Multi-Disciplinary Care Plan Details Patient Name: [name], [name] Date of Service: 07/20/2017 1:30 PM Medical Record Number: [id number] Patient Account Number: [id number] Date of Birth/Sex: August 25, 1965 (52 y.o. M) Treating RN: Curtis Sites Primary Care Marki Frede: Azucena Cecil, DAVID Other Clinician: Referring Kalonji Zurawski: Azucena Cecil, DAVID Treating Jessika Rothery/Extender: Linwood Dibbles, HOYT Weeks in Treatment: 6 Active Inactive Electronic Signature(s) Signed: 07/20/2017 4:49:47 PM By: Curtis Sites Entered By: Curtis Sites on 07/20/2017 15:54:25 [name], [name] ([id number]) -------------------------------------------------------------------------------- Pain Assessment Details Patient Name: [name], [name] Date of Service: 07/20/2017 1:30 PM Medical Record Number: [id number] Patient Account Number: [id number] Date of Birth/Sex: Jul 07, 1965 (52 y.o. M) Treating RN: Rema Jasmine Primary Care Latarra Eagleton: Azucena Cecil, DAVID Other Clinician: Referring Kynslei Art: Azucena Cecil, DAVID Treating Amyjo Mizrachi/Extender: Linwood Dibbles, HOYT Weeks in Treatment: 6 Active Problems Location of Pain Severity and Description of Pain Patient Has Paino Yes Site Locations Pain Location: Generalized Pain Duration of the Pain. Constant / Intermittento Intermittent How Long Does it Lasto Hours: 1 Minutes: Rate the pain. Current Pain Level: 0 Worst Pain Level: 10 Least Pain Level: 2 Tolerable Pain Level: 4 Character of Pain Describe the Pain: Other: pulling Pain Management and Medication Current Pain Management: How does your wound impact your activities of daily livingo Sleep: No Bathing: No Appetite: No Relationship With Others: No Bladder Continence: No Emotions: No Bowel  Continence: No Work: No Toileting: No Drive: No Dressing: No Hobbies: No Goals for Pain Management topical or injectable lidocaine is offered to patient for acute pain when surgical debridement is performed. if needed, Patient is instructed to use over the counter pain medication for the following 24-48 hours after debridement. wound care MDs do not prescribe pain medications. patient has chronic pain or uncontrolled pain.  patient has been instructed to make appointment with their primary care physician for pain management Electronic Signature(s) Signed: 07/24/2017 11:20:16 AM By: Rema Jasmine Entered By: Rema Jasmine on 07/20/2017 14:23:34 [name], [name] ([id number]) Stanislawski, [name] ([id number]) -------------------------------------------------------------------------------- Wound Assessment Details Patient Name: [name], [name] Date of Service: 07/20/2017 1:30 PM Medical Record Number: [id number] Patient Account Number: [id number] Date of Birth/Sex: 12/15/1965 (52 y.o. M) Treating RN: Curtis Sites Primary Care Nithila Sumners: Azucena Cecil, DAVID Other Clinician: Referring Leeann Bady: Azucena Cecil, DAVID Treating Nyeshia Mysliwiec/Extender: Linwood Dibbles, HOYT Weeks in Treatment: 6 Wound Status Wound Number: 4 Primary Pressure Ulcer Etiology: Wound Location: Right Trochanter Wound Healed - Epithelialized Wounding Event: Pressure Injury Status: Date Acquired: 04/09/2017 Comorbid Hypertension, Type II Diabetes, History of Weeks Of Treatment: 6 History: pressure wounds, Quadriplegia Clustered Wound: No Photos Photo Uploaded By: Rema Jasmine on 07/20/2017 13:55:57 Wound Measurements Length: (cm) 0 % Reducti Width: (cm) 0 % Reducti Depth: (cm) 0 Epithelia Area: (cm) 0 Tunnelin Volume: (cm) 0 Undermin on in Area: 100% on in Volume: 100% lization: Large (67-100%) g: No ing: No Wound Description Classification: Category/Stage III Foul Odo Wound Margin: Flat and Intact Slough/F Exudate Amount: None  Present r After Cleansing: No ibrino No Wound Bed Granulation Amount: None Present (0%) Exposed Structure Necrotic Amount: None Present (0%) Fascia Exposed: No Fat Layer (Subcutaneous Tissue) Exposed: No Tendon Exposed: No Muscle Exposed: No Joint Exposed: No Bone Exposed: No Periwound Skin Texture Texture Color No Abnormalities Noted: No No Abnormalities Noted: No Hufnagle, Levin A. ([id number]) Callus: No Atrophie Blanche: No Crepitus: No Cyanosis: No Excoriation: No Ecchymosis: No Induration: No Erythema: No Rash: No Hemosiderin Staining: No Scarring: No Mottled: No Pallor: No Moisture Rubor: No No Abnormalities Noted: No Dry / Scaly: No Temperature / Pain Maceration: No Temperature: No Abnormality Wound Preparation Ulcer Cleansing: Rinsed/Irrigated with Saline Electronic Signature(s) Signed: 07/20/2017 4:49:47 PM By: Curtis Sites Entered By: Curtis Sites on 07/20/2017 14:04:36 Cooler, [name] ([id number]) -------------------------------------------------------------------------------- Wound Assessment Details Patient Name: [name], [name] Date of Service: 07/20/2017 1:30 PM Medical Record Number: [id number] Patient Account Number: [id number] Date of Birth/Sex: 16-Oct-1965 (52 y.o. M) Treating RN: Curtis Sites Primary Care Ariyon Mittleman: Azucena Cecil, DAVID Other Clinician: Referring Randal Yepiz: Azucena Cecil, DAVID Treating Jahdiel Krol/Extender: Linwood Dibbles, HOYT Weeks in Treatment: 6 Wound Status Wound Number: 5 Primary Pressure Ulcer Etiology: Wound Location: Right, Posterior Ischium Wound Healed - Epithelialized Wounding Event: Trauma Status: Date Acquired: 06/21/2017 Comorbid Hypertension, Type II Diabetes, History of Weeks Of Treatment: 4 History: pressure wounds, Quadriplegia Clustered Wound: No Photos Photo Uploaded By: Rema Jasmine on 07/20/2017 13:55:57 Wound Measurements Length: (cm) 0 % Reducti Width: (cm) 0 % Reducti Depth: (cm) 0 Epithelia Area: (cm) 0  Tunnelin Volume: (cm) 0 Undermin on in Area: 100% on in Volume: 100% lization: Large (67-100%) g: No ing: No Wound Description Classification: Category/Stage II Foul Odo Wound Margin: Flat and Intact Slough/F Exudate Amount: None Present r After Cleansing: No ibrino No Wound Bed Granulation Amount: None Present (0%) Exposed Structure Necrotic Amount: None Present (0%) Fascia Exposed: No Fat Layer (Subcutaneous Tissue) Exposed: No Tendon Exposed: No Muscle Exposed: No Joint Exposed: No Bone Exposed: No Periwound Skin Texture Texture Color No Abnormalities Noted: No No Abnormalities Noted: No Draeger, Marquest A. ([id number]) Callus: No Atrophie Blanche: No Crepitus: No Cyanosis: No Excoriation: No Ecchymosis: No Induration: No Erythema: No Rash: No Hemosiderin Staining: No Scarring: No Mottled: No Pallor: No Moisture Rubor: No No Abnormalities Noted: No Dry / Scaly: No Temperature / Pain Maceration: No Temperature:  No Abnormality Wound Preparation Ulcer Cleansing: Rinsed/Irrigated with Saline Topical Anesthetic Applied: None Electronic Signature(s) Signed: 07/20/2017 4:49:47 PM By: Curtis Sitesorthy, Joanna Entered By: Curtis Sitesorthy, Joanna on 07/20/2017 14:04:36 Heinbaugh, [name] ([id number]) -------------------------------------------------------------------------------- Wound Assessment Details Patient Name: [name], [name] Date of Service: 07/20/2017 1:30 PM Medical Record Number: [id number] Patient Account Number: [id number] Date of Birth/Sex: 06-10-1965 (52 y.o. M) Treating RN: Curtis Sitesorthy, Joanna Primary Care Harper Smoker: Azucena CecilSWAYNE, DAVID Other Clinician: Referring Chardonay [place]: Azucena CecilSWAYNE, DAVID Treating Tashawn Greff/Extender: Linwood DibblesSTONE III, HOYT Weeks in Treatment: 6 Wound Status Wound Number: 6 Primary Pressure Ulcer Etiology: Wound Location: Left Ischium Wound Healed - Epithelialized Wounding Event: Trauma Status: Date Acquired: 07/06/2017 Comorbid Hypertension, Type II Diabetes, History  of Weeks Of Treatment: 2 History: pressure wounds, Quadriplegia Clustered Wound: No Photos Photo Uploaded By: Rema JasmineNg, Wendi on 07/20/2017 13:56:26 Wound Measurements Length: (cm) 0 % Reducti Width: (cm) 0 % Reducti Depth: (cm) 0 Epithelia Area: (cm) 0 Tunnelin Volume: (cm) 0 Undermin on in Area: 100% on in Volume: 100% lization: Large (67-100%) g: No ing: No Wound Description Classification: Category/Stage II Foul Odo Wound Margin: Flat and Intact Slough/F Exudate Amount: None Present r After Cleansing: No ibrino No Wound Bed Granulation Amount: None Present (0%) Exposed Structure Necrotic Amount: None Present (0%) Fascia Exposed: No Fat Layer (Subcutaneous Tissue) Exposed: No Tendon Exposed: No Muscle Exposed: No Joint Exposed: No Bone Exposed: No Periwound Skin Texture Texture Color No Abnormalities Noted: No No Abnormalities Noted: No Stretch, Ronelle A. ([id number]) Callus: No Atrophie Blanche: No Crepitus: No Cyanosis: No Excoriation: No Ecchymosis: No Induration: No Erythema: No Rash: No Hemosiderin Staining: No Scarring: No Mottled: No Pallor: No Moisture Rubor: No No Abnormalities Noted: No Dry / Scaly: No Maceration: No Wound Preparation Ulcer Cleansing: Rinsed/Irrigated with Saline Topical Anesthetic Applied: None Electronic Signature(s) Signed: 07/20/2017 4:49:47 PM By: Curtis Sitesorthy, Joanna Entered By: Curtis Sitesorthy, Joanna on 07/20/2017 14:04:37 Guerreiro, [name] ([id number]) -------------------------------------------------------------------------------- Vitals Details Patient Name: [name], [name] Date of Service: 07/20/2017 1:30 PM Medical Record Number: [id number] Patient Account Number: [id number] Date of Birth/Sex: 06-10-1965 (52 y.o. M) Treating RN: Rema JasmineNg, Wendi Primary Care Hilaria Titsworth: Azucena CecilSWAYNE, DAVID Other Clinician: Referring Jozie Wulf: Azucena CecilSWAYNE, DAVID Treating Geral Coker/Extender: Linwood DibblesSTONE III, HOYT Weeks in Treatment: 6 Vital Signs Time Taken:  13:30 Temperature (F): 98.2 Pulse (bpm): 98 Respiratory Rate (breaths/min): 16 Blood Pressure (mmHg): 153/82 Reference Range: 80 - 120 mg / dl Electronic Signature(s) Signed: 07/24/2017 11:20:16 AM By: Rema JasmineNg, Wendi Entered By: Rema JasmineNg, Wendi on 07/20/2017 13:45:30

## 2017-08-17 ENCOUNTER — Other Ambulatory Visit: Payer: Self-pay | Admitting: Internal Medicine

## 2017-09-04 ENCOUNTER — Other Ambulatory Visit: Payer: Self-pay | Admitting: Internal Medicine

## 2017-09-07 ENCOUNTER — Other Ambulatory Visit: Payer: Self-pay | Admitting: Internal Medicine

## 2017-10-09 ENCOUNTER — Encounter: Payer: Self-pay | Admitting: Internal Medicine

## 2017-10-09 ENCOUNTER — Ambulatory Visit: Payer: Medicare Other | Admitting: Internal Medicine

## 2017-10-09 VITALS — BP 126/86 | HR 93 | Resp 16

## 2017-10-09 DIAGNOSIS — E1165 Type 2 diabetes mellitus with hyperglycemia: Secondary | ICD-10-CM

## 2017-10-09 DIAGNOSIS — Z23 Encounter for immunization: Secondary | ICD-10-CM | POA: Diagnosis not present

## 2017-10-09 DIAGNOSIS — Z794 Long term (current) use of insulin: Secondary | ICD-10-CM | POA: Diagnosis not present

## 2017-10-09 LAB — POCT GLYCOSYLATED HEMOGLOBIN (HGB A1C): HEMOGLOBIN A1C: 8.1 % — AB (ref 4.0–5.6)

## 2017-10-09 NOTE — Progress Notes (Addendum)
Patient ID: [name], male   DOB: 1965-09-13, 52 y.o.   MRN: [id number]  HPI: [name] is a 52 y.o.-year-old male, returning to f/u for DM2, dx in 2003, insulin-dependent, uncontrolled, without complications. Last visit 4 mo ago.  As usual, he is here with his mother, who offers part of the history, as the patient has difficulty talking. He has [employer].  Patient has SMA type III (motor neuron disease), dx 1972, and he is in a wheelchair.  His swallowing continues to decrease.  His mother is checking his sugars and give him the medicines.  Unfortunately, she occasionally gets sick and she cannot give him the medications. Now she had foot surgery for osteomyelitis is and was in the hospital.  She is also in a wheelchair today with her right foot in cast.  While she was in the hospital, her husband would give him the injections.  However, he missed many Humalog injections.  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  This is managed by wound care.  They are mostly healed.  He has a new topper for his mattress.  Hemoglobin A1c levels: Lab Results  Component Value Date   HGBA1C 8.0 06/06/2017   HGBA1C 9.1 03/08/2017   HGBA1C 8.6 12/07/2016  06/21/2015: HbA1c 8.2% 02/23/2015: HbA1C: 6.3% 12/15/2013: HbA1c 8.9% 06/12/2013: HbA1c 7.8% 12/13/2012: HbA1c 7.5% 12/07/2011: HbA1c 6.9% 05/30/2010: HbA1c 8.3%  Pt is on a regimen of: - Trulicity 1.5 mg in am - Toujeo 80 >> 50-80 units at bedtime - Humalog: - 22 units with a smaller meal - 25 units with a regular meal  - 28 units with a larger meal If you do not eat b'fast, and sugars are >130, give 10 units of NovoLog. He tried Metformin x2 >> diarrhea. He was on Onglyza >> now stopped while on Trulicity.  Pt's mom is checking his sugars many times a day with his freestyle libre CGM.  He had to have a preauthorization for this as his fingers are bent. - am:  68, 185-225, 319 >> 107-162 >> 90-138, 149 - 2h after b'fast: 123-197  >> 170-203 >> n/c - before lunch:  180-226 >> 90-156, 181, 220 >> 61, 78-146, 150 - 2h after lunch: 181-285 >> 154-253 >> 187-233, 287 - before dinner:  132 >>  130-167, 206 >> 61, 189  - after dinner: 260 >> 170-232 >> 149, 215-238 - bedtime: 292 >> 333, 362 >> 241-311, 345 He has hypoglycemia awareness in the 70s. Lowest sugars, 61 Highest sugars, 345  Pt's meals are: - Breakfast: skips - Lunch: soup, sandwich, burger, diet Mtn dew - Dinner: grilled chicken, veggies, starch, diet sods - Snacks: PB, cookie  He is developing more and more aspiration with eating.  However, he is not to the point that he requires a tube feed yet.  -No CKD, last BUN/creatinine:  Lab Results  Component Value Date   BUN 10 09/06/2016   CREATININE 0.21 (L) 09/06/2016  On lisinopril. -+ HL;  last set of lipids: Lab Results  Component Value Date   CHOL 174 09/06/2016   HDL 28.90 (L) 09/06/2016   LDLCALC 60 12/15/2013   LDLDIRECT 95.0 09/06/2016   TRIG (H) 09/06/2016    537.0 Triglyceride is over 400; calculations on Lipids are invalid.   CHOLHDL 6 09/06/2016  02/23/2015: 138/37/306/40  On Crestor 40 but could not start fenofibrate b/c large capsule. - last eye exam was in 2018: 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 - No numbness and  tingling in his feet.  He still has sensation, but cannot move  ROS: Constitutional: no weight gain/no weight loss, no fatigue, no subjective hyperthermia, no subjective hypothermia, +nocturia Eyes: + blurry vision, no xerophthalmia ENT: no sore throat, no nodules palpated in throat, + dysphagia, no odynophagia, no hoarseness Cardiovascular: no CP/no SOB/no palpitations/+ leg and hand swelling Respiratory: no cough/no SOB/no wheezing Gastrointestinal: no N/no V/no D/no C/+ acid reflux Musculoskeletal: no muscle aches/+ joint aches Skin: no rashes, no hair loss Neurological: no tremors/no numbness/no tingling/no dizziness, + weakness  I reviewed pt's medications, allergies, PMH,  social hx, family hx, and changes were documented in the history of present illness. Otherwise, unchanged from my initial visit note.  Past Surgical History:  Procedure Laterality Date  . muscle bisopy     History   Social History  . Marital Status: single    Spouse Name: N/A    Number of Children: 0   Occupational History  . disabled   Social History Main Topics  . Smoking status: Former Games developer  . Smokeless tobacco: Not on file  . Alcohol Use: No  . Drug Use: No   Current Outpatient Medications on File Prior to Visit  Medication Sig Dispense Refill  . BD PEN NEEDLE NANO U/F 32G X 4 MM MISC USE ONE NEEDLE DAILY WITH LANTUS SOLOSTAR AS DIRECTED 100 each 0  . CANASA 1000 MG suppository Place 1,000 mg rectally at bedtime as needed (for ulcerative proctitis).   1  . Continuous Blood Gluc Receiver (FREESTYLE LIBRE 14 DAY READER) DEVI USE WITH 14 DAYS SENSOR  0  . Continuous Blood Gluc Sensor (FREESTYLE LIBRE 14 DAY SENSOR) MISC 1 each by Does not apply route every 14 (fourteen) days. 2 each 11  . fenofibrate micronized (LOFIBRA) 134 MG capsule TAKE 1 CAPSULE BY MOUTH DAILY BEFORE BREAKFAST. 90 capsule 0  . glucose blood (ONETOUCH VERIO) test strip Use to test blood sugar 3 times daily as instructed. 300 each 11  . HUMALOG KWIKPEN 100 UNIT/ML KiwkPen INJECT 22-28 UNITS 3 TIMES DAILY INTO THE SKIN. 75 pen 1  . HYDROcodone-acetaminophen (HYCET) 7.5-325 mg/15 ml solution Take 10 mLs by mouth at bedtime.    Marland Kitchen lisinopril (PRINIVIL,ZESTRIL) 10 MG tablet Take 10 mg by mouth daily.  3  . omeprazole (PRILOSEC) 40 MG capsule Take 1 capsule (40 mg total) by mouth daily. 14 capsule 1  . ONETOUCH DELICA LANCETS 33G MISC Use to test blood sugar 2 times daily as instructed. 100 each 11  . rosuvastatin (CRESTOR) 5 MG tablet Take 5 mg by mouth at bedtime.     . TOUJEO SOLOSTAR 300 UNIT/ML SOPN INJECT 80 UNITS INTO THE SKIN AT BEDTIME. 14 pen 3  . TRULICITY 1.5 MG/0.5ML SOPN INJECT 1.5 MG INTO THE SKIN  ONCE WEEKLY 6 pen 1   No current facility-administered medications on file prior to visit.     Allergies  Allergen Reactions  . Bactrim Ds [Sulfamethoxazole-Trimethoprim] Other (See Comments)    Pill causes gagging  . Carafate [Sucralfate] Other (See Comments)    Gagging  . Latex Other (See Comments)    Extremely irritated, fire engine red skin  . Penicillins Hives    Has patient had a PCN reaction causing immediate rash, facial/tongue/throat swelling, SOB or lightheadedness with hypotension:unknown Has patient had a PCN reaction causing severe rash involving mucus membranes or skin necrosis:unknown Has patient had a PCN reaction that required hospitalization:unknown Has patient had a PCN reaction occurring within the last 10  years:infant reaction If all of the above answers are "NO", then may proceed with Cephalosporin use.   . Clindamycin/Lincomycin Itching, Swelling and Rash   Family History  Problem Relation Age of Onset  . Diabetes Mother   . Colon polyps Mother   . Heart disease Father        CAD  . Diabetes Father    PE: BP 126/86   Pulse 93   Resp 16   SpO2 99%  There is no height or weight on file to calculate BMI.   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  He moves few muscle groups, but can talk and eat Eyes: PERRLA, EOMI, no exophthalmos ENT: moist mucous membranes, no thyromegaly, no cervical lymphadenopathy Cardiovascular: RRR, No MRG, + bilateral leg swelling Respiratory: CTA B Gastrointestinal: abdomen soft, NT, ND, BS+ Musculoskeletal: + Bilateral muscle atrophy in hands, tetraparesis, strength intact in all 4 Skin: moist, warm, no rashes Neurological: Tetraparesis  ASSESSMENT: 1. DM2, insulin-dependent, uncontrolled, without long term complications, But with  hyperglycemia - We discussed briefly about Afrezza, the inhaled insulin, but I do not think this would be a good option for him. - We did discuss abou alternative regimen with Humalog 50/50 to reduce the number of injections, however, he is eating his first meal of the day between 12 and 2 PM and the last meal of the day around 7 PM, and this would not work well with this type of regimen.  2. HL  PLAN:  1. Patient with long-standing, uncontrolled, type 2 diabetes, on basal-bolus insulin regimen and GLP-1 receptor agonist.  Before last visit, his mother started to give him the insulin injections more consistently, but was still missing many of them.  Without Humalog before the 3 meals of the day, his sugars were in the 200s and even 300s at bedtime.  Discussed about the importance of getting the Humalog injections in before every meal to be able to gain control of his diabetes. - At this visit, sugars are overall better, especially in the morning and before lunch.  He was doing much better over the summer, but in the last 2 weeks, with his mom being sick, he did not get his Humalog injections consistently, so sugars are high in the second half of the day.  She is now out of the hospital and can give him the injections.   - He had to lower blood sugars at 61 before lunch and dinner and tells me that they frequently need to reduce the dose of Toujeo to 50 as otherwise he may drop his sugars too low.  We will decrease the dose to 50 to 60 units from now on.  We will not change the dose of Humalog. - I advised him to: Patient Instructions  Please continue: - Trulicity 1.5 mg in am - Humalog: - 22 units with a smaller meal - 25 units with a regular meal  - 28 units with a larger meal  Please decrease: - Toujeo to 50-60 units at bedtime  Please return in 4 months with your sugar log.   - today, HbA1c is 8.1% (slighty higher) - continue checking sugars at different times of the day - check many  times a day with his Freestyle  CGM - advised for yearly  eye exams >> he is not UTD - He is due for annual labs-but has an appointment with PCP at the end of the month - given flu shot today - Return to clinic in 4 mo with sugar log    2. HL - Reviewed latest lipid panel from 08/2016: Triglycerides high, HDL low, LDL at goal. Lab Results  Component Value Date   CHOL 174 09/06/2016   HDL 28.90 (L) 09/06/2016   LDLCALC 60 12/15/2013   LDLDIRECT 95.0 09/06/2016   TRIG (H) 09/06/2016    537.0 Triglyceride is over 400; calculations on Lipids are invalid.   CHOLHDL 6 09/06/2016  - Continues Crestor 40, without side effects. Could not take fenofibrate as this is a large pill and he could not swallow it.   Carlus Pavlov, MD PhD [facility] Endocrinology

## 2017-10-09 NOTE — Patient Instructions (Addendum)
Please continue: - Trulicity 1.5 mg in am - Humalog: - 22 units with a smaller meal - 25 units with a regular meal  - 28 units with a larger meal  Please decrease: - Toujeo to 50-60 units at bedtime  Please return in 4 months with your sugar log.

## 2017-10-15 ENCOUNTER — Other Ambulatory Visit: Payer: Self-pay | Admitting: Internal Medicine

## 2017-11-02 ENCOUNTER — Encounter: Payer: Self-pay | Admitting: Internal Medicine

## 2017-11-02 NOTE — Progress Notes (Signed)
Received labs from PCP - from 10/29/2017. CBC without anemia Glucose 154, BUN/creatinine 3/0.21, rest of the CMP normal TSH 1.88, normal Lipids: 114/218/34/36 HbA1c 8.0%

## 2017-11-05 ENCOUNTER — Ambulatory Visit (INDEPENDENT_AMBULATORY_CARE_PROVIDER_SITE_OTHER): Payer: Medicare Other

## 2017-11-05 ENCOUNTER — Encounter: Payer: Self-pay | Admitting: Podiatry

## 2017-11-05 ENCOUNTER — Other Ambulatory Visit: Payer: Self-pay | Admitting: Podiatry

## 2017-11-05 ENCOUNTER — Ambulatory Visit: Payer: Medicare Other | Admitting: Podiatry

## 2017-11-05 DIAGNOSIS — M79671 Pain in right foot: Secondary | ICD-10-CM | POA: Diagnosis not present

## 2017-11-05 DIAGNOSIS — S99921A Unspecified injury of right foot, initial encounter: Secondary | ICD-10-CM

## 2017-11-07 NOTE — Progress Notes (Signed)
Patient presents with caregiver concernSubjective:   Patient ID: [name], male   DOB: 52 y.o.   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  There is mild swelling in the foot negative Homans sign was noted     Assessment:  Moderate trauma to the right foot with possibility for bone injury     Plan:  H&P x-rays taken reviewed and today I did apply a compression stocking to try to reduce the swelling and instructed him to wear this as tolerated.  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

## 2017-11-19 ENCOUNTER — Telehealth: Payer: Self-pay

## 2017-11-19 NOTE — Telephone Encounter (Signed)
Phone call placed to patient to introduce Palliative care and to offer to schedule visit with NP. Patient requested that I call back on Wednesday and speak with his wife.

## 2017-11-22 ENCOUNTER — Telehealth: Payer: Self-pay

## 2017-11-22 NOTE — Telephone Encounter (Signed)
VM left for patient to offer to schedule visit with Palliative care 

## 2017-11-22 NOTE — Telephone Encounter (Signed)
Noted same number listed for patient.

## 2017-12-03 ENCOUNTER — Telehealth: Payer: Self-pay

## 2017-12-03 NOTE — Telephone Encounter (Signed)
Phone call placed to patient to offer to schedule a visit with Palliative Care. VM left 

## 2017-12-07 ENCOUNTER — Telehealth: Payer: Self-pay

## 2017-12-07 NOTE — Telephone Encounter (Signed)
Phone call placed to patient's mom to introduce palliative care and to schedule visit with NP. Patient's mom, Harriett Sine, shared at length about caring for patient and the recent decline she has noted. Patient requires full assistance with all  his ADLs. He has demonstrated and increased difficulty swallowing and has noted to be coughing more and is unable to manage secretions at times. Harriett Sine shared that she has noticed increased weakness and worsening trunk control. Harriett Sine shared that she has to sit in the back with him while in the [place] to hold him upright.  He no longer is able to independently push the handle on his motorized w/c. Patient has history of pressure wounds that were resolved. He has a new area on his right ear. Patient was evaluated by dermatologist who felt area was due to pressure. Ointment that was prescribed has not been effective per report of patient's mom. Patient relies soley on his mom to care for him. Harriett Sine became tearful throughout conversation.  Provided active listening and encourage Harriett Sine to practice self care as much as possible. Visit scheduled for Wednesday 12/12/17

## 2017-12-12 ENCOUNTER — Other Ambulatory Visit: Payer: Medicare Other | Admitting: Internal Medicine

## 2018-01-08 ENCOUNTER — Other Ambulatory Visit: Payer: Medicare Other | Admitting: Internal Medicine

## 2018-01-15 ENCOUNTER — Telehealth: Payer: Self-pay

## 2018-01-15 NOTE — Telephone Encounter (Signed)
Follow up call placed to PCP regarding request for home health services. Forms also faxed.

## 2018-02-05 ENCOUNTER — Other Ambulatory Visit: Payer: Medicare Other | Admitting: Internal Medicine

## 2018-02-11 ENCOUNTER — Other Ambulatory Visit: Payer: Self-pay | Admitting: Internal Medicine

## 2018-02-12 ENCOUNTER — Ambulatory Visit (INDEPENDENT_AMBULATORY_CARE_PROVIDER_SITE_OTHER): Payer: Medicare Other | Admitting: Internal Medicine

## 2018-02-12 ENCOUNTER — Encounter: Payer: Self-pay | Admitting: Internal Medicine

## 2018-02-12 VITALS — BP 100/60 | HR 86

## 2018-02-12 DIAGNOSIS — Z794 Long term (current) use of insulin: Secondary | ICD-10-CM

## 2018-02-12 DIAGNOSIS — E1165 Type 2 diabetes mellitus with hyperglycemia: Secondary | ICD-10-CM

## 2018-02-12 DIAGNOSIS — E785 Hyperlipidemia, unspecified: Secondary | ICD-10-CM

## 2018-02-12 LAB — POCT GLYCOSYLATED HEMOGLOBIN (HGB A1C): Hemoglobin A1C: 8.7 % — AB (ref 4.0–5.6)

## 2018-02-12 MED ORDER — FREESTYLE LIBRE 14 DAY SENSOR MISC: 1.0000 | 3 refills | Status: DC

## 2018-02-12 NOTE — Addendum Note (Signed)
Addended by: Darliss Ridgel I on: 02/12/2018 03:24 PM   Modules accepted: Orders

## 2018-02-12 NOTE — Progress Notes (Signed)
Patient ID: [name], male   DOB: 11/23/1965, 53 y.o.   MRN: [id number]  HPI: [name] is a 53 y.o.-year-old male, returning to f/u for DM2, dx in 2003, insulin-dependent, uncontrolled, without complications. Last visit 4 mo ago.  As usual, he is here with his mother who offers most of the history as patient has difficulty speaking. He has [employer].  Patient has SMA type III (motor neuron disease), dx 1972, and he is in the wheelchair.  His swallowing continues to worsen.  His mother is checking his sugars and give him the medicines.  Unfortunately, she occasionally gets sick (she was in the hospital 4 times last year) and cannot give him the medications.  Her right foot is still in a boot after having osteomyelitis.  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  This is managed by wound care. Now has a new ulcer >> will see Wound Care tomorrow.  His dog was killed 2 weeks ago >> he is sad >> cannot sleep or eat.  He has not been getting the Humalog with the meals as he was not eating full meals >> sugars increased.  1st meal is later in the day - 2-4 pm.  Hemoglobin A1c levels: 10/29/2017: HbA1c 8% Lab Results  Component Value Date   HGBA1C 8.1 (A) 10/09/2017   HGBA1C 8.0 06/06/2017   HGBA1C 9.1 03/08/2017  06/21/2015: HbA1c 8.2% 02/23/2015: HbA1C: 6.3% 12/15/2013: HbA1c 8.9% 06/12/2013: HbA1c 7.8% 12/13/2012: HbA1c 7.5% 12/07/2011: HbA1c 6.9% 05/30/2010: HbA1c 8.3%  Pt is on a regimen of: - Trulicity 1.5 mg in am  - Toujeo 50-60 units at bedtime If you do not eat b'fast, and sugars are >130, give 10 units of NovoLog. He tried Metformin x2 >> diarrhea. He was on Onglyza >> now stopped while on Trulicity.  Pt's mom is checking his sugars many times a day with his freestyle libre CGM.  He had to have a preauthorization for this as his fingers are bent. Freestyle Libre CGM parameters: - average: 214 +/- 57.2 - coefficient of variation: 26.7 (19-25%) - time in  range:  - low (<70):  0% - normal (70-180): 29% - high (>180): 71%  - am: 107-162 >> 90-138, 149 >> 150-180 - 2h after b'fast: 123-197 >> 170-203 >> n/c  - before lunch:  61, 78-146, 150 >> see above - 2h after lunch: 154-253 >> 187-233, 287 >> 180-260 - before dinner:  130-167, 206 >> 61, 189  >> 260-270 - after dinner:170-232 >> 149, 215-238 >> 270-300 - bedtime: 292 >> 333, 362 >> 241-311, 345 >> 220-270 He has hypoglycemia awareness in the 70s. Lowest sugars, 61 >> 90s Highest sugars, 345 >> 300s  Pt's meals are: - Breakfast: skips - Lunch: soup, sandwich, burger, diet Mtn dew - Dinner: grilled chicken, veggies, starch, diet sods - Snacks:   He has more dysphagia and aspiration with feeding. >> not at the point that he requires a tube feed yet.  -No CKD, last BUN/creatinine:  10/29/2017: 3/0.21 Lab Results  Component Value Date   BUN 10 09/06/2016   CREATININE 0.21 (L) 09/06/2016  On Lisinopril. -+ HL;  last set of lipids: 10/29/2017: 114/218/34/36 Lab Results  Component Value Date   CHOL 174 09/06/2016   HDL 28.90 (L) 09/06/2016   LDLCALC 60 12/15/2013   LDLDIRECT 95.0 09/06/2016   TRIG (H) 09/06/2016    537.0 Triglyceride is over 400; calculations on Lipids are invalid.   CHOLHDL 6 09/06/2016  02/23/2015: 138/37/306/40  On Crestor 40 but could not start fenofibrate b/c large capsule. 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 - no numbness and tingling in his feet.  He still has sensation, but cannot move.  ROS: Constitutional: no weight gain/no weight loss, no fatigue, no subjective hyperthermia, no subjective hypothermia Eyes: no blurry vision, no xerophthalmia ENT: no sore throat, no nodules palpated in neck, no dysphagia, no odynophagia, no hoarseness Cardiovascular: no CP/no SOB/no palpitations/no leg swelling Respiratory: no cough/no SOB/no wheezing Gastrointestinal: no N/no V/+ D/no C/+ acid reflux Musculoskeletal: + muscle aches/no joint  aches Skin: no rashes, no hair loss Neurological: no tremors/no numbness/no tingling/no dizziness, + headache  I reviewed pt's medications, allergies, PMH, social hx, family hx, and changes were documented in the history of present illness. Otherwise, unchanged from my initial visit note.  He started mirtazapine since last visit.  Past Surgical History:  Procedure Laterality Date  . muscle bisopy     History   Social History  . Marital Status: single    Spouse Name: N/A    Number of Children: 0   Occupational History  . disabled   Social History Main Topics  . Smoking status: Former Games developer  . Smokeless tobacco: Not on file  . Alcohol Use: No  . Drug Use: No   Current Outpatient Medications on File Prior to Visit  Medication Sig Dispense Refill  . BD PEN NEEDLE NANO U/F 32G X 4 MM MISC USE ONE NEEDLE DAILY WITH LANTUS SOLOSTAR AS DIRECTED 100 each 0  . CANASA 1000 MG suppository Place 1,000 mg rectally at bedtime as needed (for ulcerative proctitis).   1  . Continuous Blood Gluc Receiver (FREESTYLE LIBRE 14 DAY READER) DEVI USE WITH 14 DAYS SENSOR  0  . Continuous Blood Gluc Sensor (FREESTYLE LIBRE 14 DAY SENSOR) MISC 1 each by Does not apply route every 14 (fourteen) days. 2 each 11  . fenofibrate micronized (LOFIBRA) 134 MG capsule TAKE 1 CAPSULE BY MOUTH DAILY BEFORE BREAKFAST. 90 capsule 0  . glucose blood (ONETOUCH VERIO) test strip Use to test blood sugar 3 times daily as instructed. 300 each 11  . HUMALOG KWIKPEN 100 UNIT/ML KiwkPen INJECT 22-28 UNITS 3 TIMES DAILY INTO THE SKIN. 75 pen 1  . HYDROcodone-acetaminophen (HYCET) 7.5-325 mg/15 ml solution Take 10 mLs by mouth at bedtime.    . Insulin Glargine (TOUJEO SOLOSTAR) 300 UNIT/ML SOPN Inject 50-60 Units into the skin at bedtime. 24 pen 0  . lisinopril (PRINIVIL,ZESTRIL) 10 MG tablet Take 10 mg by mouth daily.  3  . omeprazole (PRILOSEC) 40 MG capsule Take 1 capsule (40 mg total) by mouth daily. 14 capsule 1  .  ONETOUCH DELICA LANCETS 33G MISC Use to test blood sugar 2 times daily as instructed. 100 each 11  . rosuvastatin (CRESTOR) 5 MG tablet Take 5 mg by mouth at bedtime.     . TRULICITY 1.5 MG/0.5ML SOPN INJECT 1.5 MG INTO THE SKIN ONCE WEEKLY 6 pen 1   No current facility-administered medications on file prior to visit.     Allergies  Allergen Reactions  . Bactrim Ds [Sulfamethoxazole-Trimethoprim] Other (See Comments)    Pill causes gagging  . Carafate [Sucralfate] Other (See Comments)    Gagging  . Latex Other (See Comments)    Extremely irritated, fire engine red skin  . Penicillins Hives    Has patient had a PCN reaction causing immediate rash, facial/tongue/throat swelling, SOB or lightheadedness with hypotension:unknown Has patient  had a PCN reaction causing severe rash involving mucus membranes or skin necrosis:unknown Has patient had a PCN reaction that required hospitalization:unknown Has patient had a PCN reaction occurring within the last 10 years:infant reaction If all of the above answers are "NO", then may proceed with Cephalosporin use.   . Clindamycin/Lincomycin Itching, Swelling and Rash   Family History  Problem Relation Age of Onset  . Diabetes Mother   . Colon polyps Mother   . Heart disease Father        CAD  . Diabetes Father    PE: BP 100/60   Pulse 86   SpO2 98%  There is no height or weight on file to calculate BMI.   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 He moves few muscle groups, but can talk and eat Eyes: PERRLA, EOMI, no exophthalmos ENT: moist mucous membranes, no thyromegaly, no cervical lymphadenopathy Cardiovascular: RRR, No MRG, + B leg swelling Respiratory: CTA B Gastrointestinal: abdomen soft, NT, ND, BS+ Musculoskeletal: + Bilateral muscle  atrophy in hands, tetraparesis, Skin: moist, warm, no rashes Neurological:tetraparesis   ASSESSMENT: 1. DM2, insulin-dependent, uncontrolled, without long term complications, But with hyperglycemia - We discussed briefly about Afrezza, the inhaled insulin, but I do not think this would be a good option for him. - We did discuss abou alternative regimen with Humalog 50/50 to reduce the number of injections, however, he is eating his first meal of the day between 12 and 2 PM and the last meal of the day around 7 PM, and this would not work well with this type of regimen.  2. HL  PLAN:  1. Patient with longstanding, uncontrolled, type 2 diabetes, on basal-bolus insulin regimen, and GLP-1 receptor agonist.  His mother is giving him the injection and checking his sugars with a freestyle libre CGM.  However, she is occasionally sick and in the hospital also in that situation, he is not getting his injections.  Also, since last visit, his dog that he had for 11 years died and he is very sad and not eating much.  However, when he eats he is not getting the Humalog and I strongly advised him to start taking it because his sugars increase as the day goes by, especially after first meal of the day, which is later in the day.  I do not feel we need to make any other changes in his regimen for now. - I advised him to: Patient Instructions  Please continue: - Trulicity 1.5 mg in am - Toujeo 50-60 units at bedtime  Please resume: - Humalog: 18-26 units before each meal  Please return in 4 months with your sugar log.   - today, HbA1c is 8.7% (higher) - continue checking sugars at different times of the day - check 4x a day, rotating checks - advised for yearly eye exams >> he is not UTD - Return to clinic in 4 mo with sugar log    2. HL - Reviewed latest lipid panel from 10/2017: Triglycerides much better - Continues Crestor 40, without side effects. Could not take fenofibrate as this is a large pill  and he could not swallow it.   Carlus Pavlovristina Marlen Mollica, MD PhD [facility] Endocrinology

## 2018-02-12 NOTE — Patient Instructions (Addendum)
Please continue: - Trulicity 1.5 mg in am - Toujeo 50-60 units at bedtime  Please resume: - Humalog: 18-26 units before each meal  Please return in 4 months with your sugar log.

## 2018-02-13 ENCOUNTER — Encounter: Payer: Medicare Other | Attending: Internal Medicine | Admitting: Internal Medicine

## 2018-02-13 DIAGNOSIS — E119 Type 2 diabetes mellitus without complications: Secondary | ICD-10-CM | POA: Insufficient documentation

## 2018-02-13 DIAGNOSIS — K219 Gastro-esophageal reflux disease without esophagitis: Secondary | ICD-10-CM | POA: Insufficient documentation

## 2018-02-13 DIAGNOSIS — G121 Other inherited spinal muscular atrophy: Secondary | ICD-10-CM | POA: Diagnosis not present

## 2018-02-13 DIAGNOSIS — G7100 Muscular dystrophy, unspecified: Secondary | ICD-10-CM | POA: Diagnosis not present

## 2018-02-13 DIAGNOSIS — I10 Essential (primary) hypertension: Secondary | ICD-10-CM | POA: Diagnosis not present

## 2018-02-13 DIAGNOSIS — H547 Unspecified visual loss: Secondary | ICD-10-CM | POA: Insufficient documentation

## 2018-02-13 DIAGNOSIS — L89211 Pressure ulcer of right hip, stage 1: Secondary | ICD-10-CM | POA: Insufficient documentation

## 2018-02-13 DIAGNOSIS — Z880 Allergy status to penicillin: Secondary | ICD-10-CM | POA: Insufficient documentation

## 2018-02-13 DIAGNOSIS — Z794 Long term (current) use of insulin: Secondary | ICD-10-CM | POA: Insufficient documentation

## 2018-02-13 DIAGNOSIS — E7800 Pure hypercholesterolemia, unspecified: Secondary | ICD-10-CM | POA: Insufficient documentation

## 2018-02-13 DIAGNOSIS — G8250 Quadriplegia, unspecified: Secondary | ICD-10-CM | POA: Diagnosis not present

## 2018-02-14 NOTE — Progress Notes (Signed)
[name], [name] ([id number]) Visit Report for 02/13/2018 HPI Details Patient Name: [name], [name] RancherUL A. Date of Service: 02/13/2018 2:30 PM Medical Record Number: [id number] Patient Account Number: [id number] Date of Birth/Sex: 22-Mar-1965 (53 y.o. M) Treating RN: [name] Primary Care Provider: Azucena CecilSWAYNE, Best Other Clinician: Referring Provider: Azucena CecilSWAYNE, Best Treating Provider/Extender: [name] G Weeks in Treatment: 0 History of Present Illness HPI Description: 06/07/17 patient presents today for initial evaluation and our clinic regarding a new wound although he has previously been a patient here for similar issues. He has muscular dystrophy and secondary to this significant weakness and contractures of his lower extremities which therefore does tend to cause him to have recurrent ulcers on his gluteal, sacral, Ischial location. Right now he actually has a right trochanter wound which has been present for several weeks and according to his mother who does take care of them and in my pinion does an excellent job this has gotten better but still she was concerned about getting it to completely close so it does not worsened. Patient does have diabetes and his hemoglobin A1c is 8.0 patient did see his primary care provider, [name], on 06/06/17 according to the after visit summary nothing was really addressed in regard to the wounds at that time however. Other than diabetes the patient is blind, have hypertension, and muscular dystrophy obviously. 06/21/17-he is here in follow-up evaluation for a right trochanter pressure injury he presents with a new area of skin stripping to a healed/scarred right ischium. he is able to calm every 2 weeks secondary to transportation needs. He tolerated debridement, we will continue with collagen and he will follow-up in 2 weeks 07/06/17-He is here in follow-up evaluation, accompanied by his mother, for evaluation of right trochanter, right ischium ulcers with  a new area of concern to the left fascia. All areas are superficial at today's visit. We will protect him with foam border. We will order for nu-motion to evaluate chair cushion; the cushion in the chair he spends most of his time on is "decades" old and the chair is his newer motorized chair is "too hard". He will follow up in two week 07/20/17 on evaluation today patient appears to be doing rather well in regard to his gluteal ulcers. In fact everything at this point on evaluation today appears to be completely healed which is excellent news. He's been tolerating the dressing changes without any complications. Unfortunately after the last time he was here his wife actually sustained a heart attack fortunately she is here today and seems to be doing fairly well she still is taking excellent care of him. Nonetheless this is an additional stressor on their lives right now. Fortunately him healing I think will be of benefit in relieving some of that stress. 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 He is cared for heroically at home by his parents. He has been in the clinic have previously in 2017 2019. Most recently he was here earlier in the year with a pressure area over the right greater trochanter that healed fairly well. He has a gel overlay mattress for his bed I do not think he is ever qualified for a more advanced surface. According to his mother they are attempting to get him a new wheelchair through insurance. They were using a border foam type dressing over  this area with skin prep. They are out of supplies. Past medical history; type 2 diabetes on insulin, spinal muscular dystrophy syndrome Electronic Signature(s) Signed: 02/13/2018 4:38:41 PM By: [name] Najjarobson, Heriberto Stmartin MD Entered By: [name] Najjarobson, Avaline Stillson on 02/13/2018  15:39:31 [name], [name] ([id number]) -------------------------------------------------------------------------------- Physical Exam Details Patient Name: [name], [name] Date of Service: 02/13/2018 2:30 PM Medical Record Number: [id number] Patient Account Number: [id number] Date of Birth/Sex: [birth year] (53 y.o. M) Treating RN: [name] Primary Care Provider: Azucena CecilSWAYNE, Best Other Clinician: Referring Provider: Azucena CecilSWAYNE, Best Treating Provider/Extender: [name] CarolinaOBSON, Sultana Tierney G Weeks in Treatment: 0 Constitutional Sitting or standing Blood Pressure is within target range for patient.. Pulse regular and within target range for patient.Marland Kitchen. Respirations regular, non-labored and within target range.. Temperature is normal and within the target range for the patient.Marland Kitchen. appears in no distress. Eyes sun glasses in place.Marland Kitchen. Respiratory Respiratory effort is easy and symmetric bilaterally. Rate is normal at rest and on room air.. Cardiovascular Pedal pulses palpable and strong bilaterally.. Musculoskeletal Patient does not have antigravity strength in his arms no sensation deficits. Integumentary (Hair, Skin) No other cutaneous issues are seen. Psychiatric No evidence of depression, anxiety, or agitation. Calm, cooperative, and communicative. Appropriate interactions and affect.. Notes Wound exam; the patient has a small stage I pressure ulcer about the size of a quarter over the right greater trochanter. There is a portion of this that has the epidermis removed but there is certainly no depth of this wound. No evidence of surrounding infection. The patient has had problems with this area before and he has been in this clinic however right now I do not think he needs to be followed here I would simply use a foam cover on this and rigorous offloading as much is a situation will allow Electronic Signature(s) Signed: 02/13/2018 4:38:41 PM By: [name] Najjarobson, Alfreda Hammad MD Entered By: [name] Najjarobson, Celenia Hruska on 02/13/2018  15:41:54 Rowland, [name] ([id number]) -------------------------------------------------------------------------------- Physician Orders Details Patient Name: Gilmartin, Ranen A. Date of Service: 02/13/2018 2:30 PM Medical Record Number: [id number] Patient Account Number: [id number] Date of Birth/Sex: [birth year] (53 y.o. M) Treating RN: [name] Primary Care Provider: Azucena CecilSWAYNE, Best Other Clinician: Referring Provider: Azucena CecilSWAYNE, Best Treating Provider/Extender: [name] CarolinaOBSON, Jakota Manthei G Weeks in Treatment: 0 Verbal / Phone Orders: No Diagnosis Coding Primary Wound Dressing o Other: - bordered foam Dressing Change Frequency Wound #7 Right Trochanter o Other: - as needed Follow-up Appointments Wound #7 Right Trochanter o Other: - as needed Off-Loading Wound #7 Right Trochanter o Gel wheelchair cushion Electronic Signature(s) Signed: 02/13/2018 4:38:41 PM By: [name] Najjarobson, Yenty Bloch MD Signed: 02/13/2018 5:16:46 PM By: Elliot GurneyWoody, BSN, RN, CWS, [name] RN, BSN Entered By: Elliot GurneyWoody, BSN, RN, CWS, Best on 02/13/2018 15:06:38 Dyal, [name] ([id number]) -------------------------------------------------------------------------------- Problem List Details Patient Name: Luft, Malik A. Date of Service: 02/13/2018 2:30 PM Medical Record Number: [id number] Patient Account Number: [id number] Date of Birth/Sex: [birth year] (53 y.o. M) Treating RN: [name] Primary Care Provider: Azucena CecilSWAYNE, Best Other Clinician: Referring Provider: Azucena CecilSWAYNE, Best Treating Provider/Extender: [name] CarolinaOBSON, Anmol Paschen G Weeks in Treatment: 0 Active Problems ICD-10 Evaluated Encounter Code Description Active Date Today Diagnosis L89.211 Pressure ulcer of right hip, stage 1 02/13/2018 No Yes G12.1 Other inherited spinal muscular atrophy 02/13/2018 No Yes Inactive Problems Resolved Problems Electronic Signature(s) Signed: 02/13/2018 4:38:41 PM By: [name] Najjarobson, Jaydi Bray MD Entered By: [name] Najjarobson, Alexandr Oehler on 02/13/2018 15:28:40 [name], [name]  ([id number]) -------------------------------------------------------------------------------- Progress Note Details Patient Name: Elk, Houston A. Date of Service: 02/13/2018 2:30 PM Medical Record Number: [id number] Patient Account Number: [id number] Date of Birth/Sex: [birth year] (  53 y.o. M) Treating RN: [name] Coventry Primary Care Provider: Azucena Cecil, Best Other Clinician: Referring Provider: Azucena Cecil, Best Treating Provider/Extender: [name] [place] in Treatment: 0 Subjective History of Present Illness (HPI) 06/07/17 patient presents today for initial evaluation and our clinic regarding a new wound although he has previously been a patient here for similar issues. He has muscular dystrophy and secondary to this significant weakness and contractures of his lower extremities which therefore does tend to cause him to have recurrent ulcers on his gluteal, sacral, Ischial location. Right now he actually has a right trochanter wound which has been present for several weeks and according to his mother who does take care of them and in my pinion does an excellent job this has gotten better but still she was concerned about getting it to completely close so it does not worsened. Patient does have diabetes and his hemoglobin A1c is 8.0 patient did see his primary care provider, [name] Puls, on 06/06/17 according to the after visit summary nothing was really addressed in regard to the wounds at that time however. Other than diabetes the patient is blind, have hypertension, and muscular dystrophy obviously. 06/21/17-he is here in follow-up evaluation for a right trochanter pressure injury he presents with a new area of skin stripping to a healed/scarred right ischium. he is able to calm every 2 weeks secondary to transportation needs. He tolerated debridement, we will continue with collagen and he will follow-up in 2 weeks 07/06/17-He is here in follow-up evaluation, accompanied by his mother, for evaluation  of right trochanter, right ischium ulcers with a new area of concern to the left fascia. All areas are superficial at today's visit. We will protect him with foam border. We will order for nu-motion to evaluate chair cushion; the cushion in the chair he spends most of his time on is "decades" old and the chair is his newer motorized chair is "too hard". He will follow up in two week 07/20/17 on evaluation today patient appears to be doing rather well in regard to his gluteal ulcers. In fact everything at this point on evaluation today appears to be completely healed which is excellent news. He's been tolerating the dressing changes without any complications. Unfortunately after the last time he was here his wife actually sustained a heart attack fortunately she is here today and seems to be doing fairly well she still is taking excellent care of him. Nonetheless this is an additional stressor on their lives right now. Fortunately him healing I think will be of benefit in relieving some of that stress. 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 He is cared for heroically at home by his parents. He has been in the clinic have previously in 2017 2019. Most recently he was here earlier in the year with a pressure area over the right greater trochanter that healed fairly well. He has a gel overlay mattress for his bed I do not think he is ever qualified for a more advanced surface. According to his mother they are attempting to get him a new wheelchair through insurance. They were using a border foam type dressing over this area with skin prep. They are out of supplies. Past medical history; type 2 diabetes on insulin, spinal muscular dystrophy syndrome Wound History Patient reportedly has not tested positive for  osteomyelitis.  Patient reportedly has not had testing performed to evaluate circulation in the legs. Patient History Information obtained from Patient. Allergies penicillin (Severity: Severe, Reaction: rash) [name], [name] ([id number]) Family History Cancer - Maternal Grandparents, Diabetes - Mother,Father, Heart Disease - Father, Hypertension - Mother,Father, Thyroid Problems - Mother, No family history of Hereditary Spherocytosis, Kidney Disease, Lung Disease, Seizures, Stroke, Tuberculosis. Social History Never smoker, Marital Status - Single, Alcohol Use - Never - HX, Drug Use - No History, Caffeine Use - Daily. 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 Denies complaints or symptoms of Dry Eyes, Glasses / Contacts. Ear/Nose/Mouth/Throat Denies complaints or symptoms of Difficult clearing ears, Sinusitis. Hematologic/Lymphatic Denies complaints or symptoms of Bleeding / Clotting Disorders, Human Immunodeficiency Virus. Respiratory Denies complaints or symptoms of Chronic or frequent coughs, Shortness of Breath. Cardiovascular Complains or has symptoms of LE edema - r leg. Gastrointestinal Denies complaints or symptoms of Frequent diarrhea, Nausea, Vomiting. Endocrine Denies complaints or symptoms of Hepatitis, Thyroid disease, Polydypsia (Excessive Thirst). Genitourinary Denies complaints or symptoms of Kidney failure/ Dialysis, Incontinence/dribbling. Neurologic Denies complaints or symptoms of Numbness/parasthesias, Focal/Weakness. Psychiatric Complains or has symptoms of Anxiety, Claustrophobia. Objective Constitutional Sitting or standing Blood Pressure is within target range for patient.. Pulse regular and  within target range for patient.Marland Kitchen Respirations regular, non-labored and within target range.. Temperature is normal and within the target range for the patient.Marland Kitchen appears in no distress. Zakrzewski, JAYCEN VERCHER ([id number]) Vitals Time Taken: 2:17 PM, Temperature: 98.0 F, Pulse: 84 bpm, Respiratory Rate: 16 breaths/min, Blood Pressure: 138/70 mmHg. Eyes sun glasses in place.Marland Kitchen Respiratory Respiratory effort is easy and symmetric bilaterally. Rate is normal at rest and on room air.. Cardiovascular Pedal pulses palpable and strong bilaterally.. Musculoskeletal Patient does not have antigravity strength in his arms no sensation deficits. Psychiatric No evidence of depression, anxiety, or agitation. Calm, cooperative, and communicative. Appropriate interactions and affect.. General Notes: Wound exam; the patient has a small stage I pressure ulcer about the size of a quarter over the right greater trochanter. There is a portion of this that has the epidermis removed but there is certainly no depth of this wound. No evidence of surrounding infection. The patient has had problems with this area before and he has been in this clinic however right now I do not think he needs to be followed here I would simply use a foam cover on this and rigorous offloading as much is a situation will allow Integumentary (Hair, Skin) No other cutaneous issues are seen. Wound #7 status is Open. Original cause of wound was Pressure Injury. The wound is located on the Right Trochanter. The wound measures 1cm length x 0.4cm width x 0.1cm depth; 0.314cm^2 area and 0.031cm^3 volume. The wound is limited to skin breakdown. There is no tunneling or undermining noted. There is a none present amount of drainage noted. The wound margin is flat and intact. There is no granulation within the wound bed. There is no necrotic tissue within the wound bed. The periwound skin appearance exhibited: Scarring, Rubor. The periwound skin  appearance did not exhibit: Callus, Crepitus, Excoriation, Induration, Rash, Dry/Scaly, Maceration, Atrophie Blanche, Cyanosis, Ecchymosis, Hemosiderin Staining, Mottled, Pallor, Erythema. Periwound temperature was noted as No Abnormality. Assessment Active Problems ICD-10 Pressure ulcer of right hip, stage 1 Other inherited spinal muscular atrophy Plan Primary Wound Dressing:  Other: - bordered foam Dressing Change Frequency: [name], [name] ([id number]) Wound #7 Right Trochanter: Other: - as needed Follow-up Appointments: Wound #7 Right Trochanter: Other: - as needed Off-Loading: Wound #7 Right Trochanter: Gel wheelchair cushion 1. I would simply use border foam over this area. Cautioned pressure relief over this area especially while he is in bed. He is exceptionally disabled but unfortunately not eligible for a more aggressive surface on his mattress based on strict guidelines. Electronic Signature(s) Signed: 02/13/2018 4:38:41 PM By: [name] Najjar MD Entered By: [name] Najjar on 02/13/2018 15:43:39 [name], [name] ([id number]) -------------------------------------------------------------------------------- ROS/PFSH Details Patient Name: [name], [name] Date of Service: 02/13/2018 2:30 PM Medical Record Number: [id number] Patient Account Number: [id number] Date of Birth/Sex: [birth year] (53 y.o. M) Treating RN: Rema Jasmine Primary Care Provider: Azucena Cecil, Best Other Clinician: Referring Provider: Azucena Cecil, Best Treating Provider/Extender: [name] [place] in Treatment: 0 Information Obtained From Patient Wound History Do you currently have one or more open woundso No Have you tested positive for osteomyelitis (bone infection)o No Have you had any tests for circulation on your legso No Eyes Complaints and Symptoms: Positive for: Vision Changes - pt stated sensitivity to light Negative for: Dry Eyes; Glasses / Contacts Medical History: Negative for: Cataracts;  Glaucoma; Optic Neuritis Past Medical History Notes: stigmatism Ear/Nose/Mouth/Throat Complaints and Symptoms: Negative for: Difficult clearing ears; Sinusitis Medical History: Negative for: Chronic sinus problems/congestion; Middle ear problems Hematologic/Lymphatic Complaints and Symptoms: Negative for: Bleeding / Clotting Disorders; Human Immunodeficiency Virus Medical History: Negative for: Anemia; Hemophilia; Human Immunodeficiency Virus; Lymphedema; Sickle Cell Disease Respiratory Complaints and Symptoms: Negative for: Chronic or frequent coughs; Shortness of Breath Medical History: Negative for: Aspiration; Asthma; Chronic Obstructive Pulmonary Disease (COPD); Pneumothorax; Sleep Apnea; Tuberculosis Cardiovascular Complaints and Symptoms: Positive for: LE edema - r leg Medical History: Positive for: Hypertension [name], [name] ([id number]) Negative for: Angina; Arrhythmia; Congestive Heart Failure; Coronary Artery Disease; Deep Vein Thrombosis; Hypotension; Myocardial Infarction; Peripheral Arterial Disease; Peripheral Venous Disease; Phlebitis; Vasculitis Past Medical History Notes: hypercholesterolemia Gastrointestinal Complaints and Symptoms: Negative for: Frequent diarrhea; Nausea; Vomiting Medical History: Negative for: Cirrhosis ; Colitis; Crohnos; Hepatitis A; Hepatitis B; Hepatitis C Past Medical History Notes: GERD Endocrine Complaints and Symptoms: Negative for: Hepatitis; Thyroid disease; Polydypsia (Excessive Thirst) Medical History: Positive for: Type II Diabetes Time with diabetes: 2005 Treated with: Insulin, Oral agents Blood sugar tested every day: No Genitourinary Complaints and Symptoms: Negative for: Kidney failure/ Dialysis; Incontinence/dribbling Medical History: Negative for: End Stage Renal Disease Neurologic Complaints and Symptoms: Negative for: Numbness/parasthesias; Focal/Weakness Medical History: Positive for:  Quadriplegia Negative for: Dementia; Neuropathy; Paraplegia; Seizure Disorder Psychiatric Complaints and Symptoms: Positive for: Anxiety; Claustrophobia Medical History: Negative for: Anorexia/bulimia; Confinement Anxiety Immunological Medical History: Negative for: Lupus Erythematosus; Raynaudos; Scleroderma Past Medical History Notes: Muscular Distrophy [name], [name] ([id number]) Integumentary (Skin) Medical History: Positive for: History of pressure wounds Negative for: History of Burn Musculoskeletal Medical History: Negative for: Gout; Rheumatoid Arthritis; Osteoarthritis; Osteomyelitis Past Medical History Notes: Muscular Distrophy, SMA type 3 Oncologic Medical History: Negative for: Received Chemotherapy; Received Radiation Immunizations Pneumococcal Vaccine: Received Pneumococcal Vaccination: Yes Implantable Devices Family and Social History Cancer: Yes - Maternal Grandparents; Diabetes: Yes - Mother,Father; Heart Disease: Yes - Father; Hereditary Spherocytosis: No; Hypertension: Yes - Mother,Father; Kidney Disease: No; Lung Disease: No; Seizures: No; Stroke: No; Thyroid Problems: Yes - Mother; Tuberculosis: No; Never smoker; Marital Status - Single; Alcohol Use: Never - HX; Drug Use: No History; Caffeine Use: Daily; Financial Concerns: No; Food, Clothing  or Shelter Needs: No; Support System Lacking: No; Transportation Concerns: No; Advanced Directives: No; Patient does not want information on Advanced Directives; Do not resuscitate: No; Living Will: No; Medical Power of Attorney: No Electronic Signature(s) Signed: 02/13/2018 3:32:14 PM By: Rema Jasmine Signed: 02/13/2018 4:38:41 PM By: [name] Najjar MD Entered By: Rema Jasmine on 02/13/2018 15:00:55 [name], [name] ([id number]) -------------------------------------------------------------------------------- SuperBill Details Patient Name: Lysaght, Kailon A. Date of Service: 02/13/2018 Medical Record Number:  [id number] Patient Account Number: [id number] Date of Birth/Sex: Aug 15, 1965 (53 y.o. M) Treating RN: [name] Coventry Primary Care Provider: Azucena Cecil, Best Other Clinician: Referring Provider: Azucena Cecil, Best Treating Provider/Extender: [name] [place] in Treatment: 0 Diagnosis Coding ICD-10 Codes Code Description [phone] Pressure ulcer of right hip, stage 1 G12.1 Other inherited spinal muscular atrophy Facility Procedures CPT4 Code: [id number] Description: [phone] - WOUND CARE VISIT-LEV 2 EST PT Modifier: Quantity: 1 Physician Procedures CPT4 Code: [id number] Description: 99213 - WC PHYS LEVEL 3 - EST PT ICD-10 Diagnosis Description L89.211 Pressure ulcer of right hip, stage 1 G12.1 Other inherited spinal muscular atrophy Modifier: Quantity: 1 Electronic Signature(s) Signed: 02/13/2018 4:38:41 PM By: [name] Najjar MD Entered By: [name] Najjar on 02/13/2018 15:44:00

## 2018-02-14 NOTE — Progress Notes (Signed)
ZAKHI, UYEHARA ([id number]) Visit Report for 02/13/2018 Abuse/Suicide Risk Screen Details Patient Name: [name], [name] Date of Service: 02/13/2018 2:30 PM Medical Record Number: [id number] Patient Account Number: [id number] Date of Birth/Sex: December 03, 1965 (53 y.o. M) Treating RN: Rema Jasmine Primary Care Bostyn Bogie: Azucena Cecil, DAVID Other Clinician: Referring Vearl Allbaugh: Azucena Cecil, DAVID Treating Topaz Raglin/Extender: Altamese [place] in Treatment: 0 Abuse/Suicide Risk Screen Items Answer ABUSE/SUICIDE RISK SCREEN: Has anyone close to you tried to hurt or harm you recentlyo No Do you feel uncomfortable with anyone in your familyo No Has anyone forced you do things that you didnot want to doo No Do you have any thoughts of harming yourselfo No Patient displays signs or symptoms of abuse and/or neglect. No Notes mother at bedside. Electronic Signature(s) Signed: 02/13/2018 3:32:14 PM By: Rema Jasmine Entered By: Rema Jasmine on 02/13/2018 14:42:50 [name], [name] ([id number]) -------------------------------------------------------------------------------- Activities of Daily Living Details Patient Name: [name], [name] Date of Service: 02/13/2018 2:30 PM Medical Record Number: [id number] Patient Account Number: [id number] Date of Birth/Sex: 07/20/1965 (53 y.o. M) Treating RN: Rema Jasmine Primary Care Bow Buntyn: Azucena Cecil, DAVID Other Clinician: Referring Kataleya Zaugg: Azucena Cecil, DAVID Treating Campbell Kray/Extender: Altamese [place] in Treatment: 0 Activities of Daily Living Items Answer Activities of Daily Living (Please select one for each item) Drive Automobile Not Able Take Medications Not Able Use Telephone Not Able Care for Appearance Not Able Use Toilet Not Able Bath / Shower Not Able Dress Self Not Able Feed Self Not Able Walk Not Able Get In / Out Bed Not Able Housework Not Able Prepare Meals Not Able Handle Money Not Able Shop for Self Need Assistance Electronic Signature(s) Signed:  02/13/2018 3:32:14 PM By: Rema Jasmine Entered By: Rema Jasmine on 02/13/2018 14:43:46 [name], [name] ([id number]) -------------------------------------------------------------------------------- Education Assessment Details Patient Name: [name], [name] Date of Service: 02/13/2018 2:30 PM Medical Record Number: [id number] Patient Account Number: [id number] Date of Birth/Sex: [birth year] (53 y.o. M) Treating RN: Rema Jasmine Primary Care Dmarion Perfect: Azucena Cecil, DAVID Other Clinician: Referring Jisele Price: Azucena Cecil, DAVID Treating Kama Cammarano/Extender: Altamese [place] in Treatment: 0 Learning Preferences/Education Level/Primary Language Learning Preference: Explanation, Demonstration Highest Education Level: High School Cognitive Barrier Assessment/Beliefs Language Barrier: No Translator Needed: No Memory Deficit: No Emotional Barrier: No Cultural/Religious Beliefs Affecting Medical Care: No Physical Barrier Assessment Impaired Vision: No Impaired Hearing: No Decreased Hand dexterity: No Knowledge/Comprehension Assessment Knowledge Level: High Comprehension Level: High Ability to understand written High instructions: Ability to understand verbal High instructions: Motivation Assessment Anxiety Level: Calm Cooperation: Cooperative Education Importance: Acknowledges Need Interest in Health Problems: Asks Questions Perception: Coherent Willingness to Engage in Self- High Management Activities: Readiness to Engage in Self- High Management Activities: Electronic Signature(s) Signed: 02/13/2018 3:32:14 PM By: Rema Jasmine Entered By: Rema Jasmine on 02/13/2018 14:44:28 [name], [name] ([id number]) -------------------------------------------------------------------------------- Fall Risk Assessment Details Patient Name: [name], [name] Date of Service: 02/13/2018 2:30 PM Medical Record Number: [id number] Patient Account Number: [id number] Date of Birth/Sex: [birth year] (53 y.o. M) Treating RN:  Rema Jasmine Primary Care Younes Degeorge: Azucena Cecil, DAVID Other Clinician: Referring Tamarick Kovalcik: Azucena Cecil, DAVID Treating Dyanna Seiter/Extender: Altamese [place] in Treatment: 0 Fall Risk Assessment Items Have you had 2 or more falls in the last 12 monthso 0 No Have you had any fall that resulted in injury in the last 12 monthso 0 No FALL RISK ASSESSMENT: History of falling - immediate or within 3 months 0 No Secondary diagnosis 0 No Ambulatory aid None/bed rest/wheelchair/nurse 0 No Crutches/cane/walker 0 No Furniture 0 No IV Access/Saline  Lock 0 No Gait/Training Normal/bed rest/immobile 0 No Weak 0 No Impaired 0 No Mental Status Oriented to own ability 0 No Electronic Signature(s) Signed: 02/13/2018 3:32:14 PM By: Rema Jasmine Entered By: Rema Jasmine on 02/13/2018 14:44:43 [name], [name] ([id number]) -------------------------------------------------------------------------------- Nutrition Risk Assessment Details Patient Name: [name], [name] Date of Service: 02/13/2018 2:30 PM Medical Record Number: [id number] Patient Account Number: [id number] Date of Birth/Sex: 1965-03-08 (53 y.o. M) Treating RN: Rema Jasmine Primary Care Duell Holdren: Azucena Cecil, DAVID Other Clinician: Referring Simmie Camerer: Azucena Cecil, DAVID Treating Wenonah Milo/Extender: Altamese [place] in Treatment: 0 Height (in): Weight (lbs): Body Mass Index (BMI): Nutrition Risk Assessment Items NUTRITION RISK SCREEN: I have an illness or condition that made me change the kind and/or amount of 0 No food I eat I eat fewer than two meals per day 0 No I eat few fruits and vegetables, or milk products 0 No I have three or more drinks of beer, liquor or wine almost every day 0 No I have tooth or mouth problems that make it hard for me to eat 0 No I don't always have enough money to buy the food I need 0 No I eat alone most of the time 0 No I take three or more different prescribed or over-the-counter drugs a day 0 No Without wanting to, I  have lost or gained 10 pounds in the last six months 0 No I am not always physically able to shop, cook and/or feed myself 0 No Nutrition Protocols Good Risk Protocol Moderate Risk Protocol Electronic Signature(s) Signed: 02/13/2018 3:32:14 PM By: Rema Jasmine Entered ByRema Jasmine on 02/13/2018 14:44:58

## 2018-02-15 NOTE — Progress Notes (Signed)
[name] RobertMOFFITT, Marsha A. ([id number]) Visit Report for 02/13/2018 Allergy List Details Patient Name: [name], [name] FickleUL A. Date of Service: 02/13/2018 2:30 PM Medical Record Number: [id number] Patient Account Number: [id number] Date of Birth/Sex: 1965-08-03 (53 y.o. M) Treating RN: [name] Primary Care [name]: [name] Other Clinician: Referring [name] Treating Lennell Shanks/Extender: [name] Weeks in Treatment: 0 Allergies Active Allergies penicillin Reaction: rash Severity: Severe Allergy Notes Electronic Signature(s) Signed: 02/13/2018 3:32:14 PM By: [name] Entered By: [name] on 02/13/2018 14:27:04 Frederic, [name] ([id number]) -------------------------------------------------------------------------------- Arrival Information Details Patient Name: [name], [name] Date of Service: 02/13/2018 2:30 PM Medical Record Number: [id number] Patient Account Number: [id number] Date of Birth/Sex: 1965-08-03 (53 y.o. M) Treating RN: [name] Primary Care Shamra Bradeen: [name] Other Clinician: Referring Keilee Denman: [name] Treating Saphyra Hutt/Extender: [name] Weeks in Treatment: 0 Visit Information Patient Arrived: Wheel Chair Arrival Time: 14:16 Accompanied By: mother Transfer Assistance: Nurse, adultHoyer Lift Patient Identification Verified: Yes Secondary Verification Process Completed: Yes History Since Last Visit Added or deleted any medications: No Any new allergies or adverse reactions: No Had a fall or experienced change in activities of daily living that may affect risk of falls: No Signs or symptoms of abuse/neglect since last visito No Hospitalized since last visit: No Implantable device outside of the clinic excluding cellular tissue based products placed in the center since last visit: No Pain Present Now: No Electronic Signature(s) Signed: 02/14/2018 4:16:21 PM By: [name], RCP,RRT,Best, [name] Entered By: [name], RCP,RRT,Best,  [name] on 02/13/2018 14:17:27 [name], [name] ([id number]) -------------------------------------------------------------------------------- Clinic Level of Care Assessment Details Patient Name: [name], [name] Date of Service: 02/13/2018 2:30 PM Medical Record Number: [id number] Patient Account Number: [id number] Date of Birth/Sex: 1965-08-03 (53 y.o. M) Treating RN: [name] Primary Care Mika Griffitts: [name] Other Clinician: Referring Kartik Fernando: [name] Treating Avier Jech/Extender: [name] Weeks in Treatment: 0 Clinic Level of Care Assessment Items TOOL 1 Quantity Score []  - Use when EandM and Procedure is performed on INITIAL visit 0 ASSESSMENTS - Nursing Assessment / Reassessment []  - General Physical Exam (combine w/ comprehensive assessment (listed just below) when 0 performed on new pt. evals) X- 1 25 Comprehensive Assessment (HX, ROS, Risk Assessments, Wounds Hx, etc.) ASSESSMENTS - Wound and Skin Assessment / Reassessment []  - Dermatologic / Skin Assessment (not related to wound area) 0 ASSESSMENTS - Ostomy and/or Continence Assessment and Care []  - Incontinence Assessment and Management 0 []  - 0 Ostomy Care Assessment and Management (repouching, etc.) PROCESS - Coordination of Care X - Simple Patient / Family Education for ongoing care 1 15 []  - 0 Complex (extensive) Patient / Family Education for ongoing care []  - 0 Staff obtains ChiropractorConsents, Records, Test Results / Process Orders []  - 0 Staff telephones HHA, Nursing Homes / Clarify orders / etc []  - 0 Routine Transfer to another Facility (non-emergent condition) []  - 0 Routine Hospital Admission (non-emergent condition) []  - 0 New Admissions / Manufacturing engineernsurance Authorizations / Ordering NPWT, Apligraf, etc. []  - 0 Emergency Hospital Admission (emergent condition) PROCESS - Special Needs []  - Pediatric / Minor Patient Management 0 []  - 0 Isolation Patient Management []  - 0 Hearing / Language / Visual  special needs []  - 0 Assessment of Community assistance (transportation, D/C planning, etc.) []  - 0 Additional assistance / Altered mentation []  - 0 Support Surface(s) Assessment (bed, cushion, seat, etc.) [name], [name] ([id number]) INTERVENTIONS - Miscellaneous []  - External ear exam 0 []  - 0 Patient Transfer (multiple  staff / Michiel Sites Lift / Similar devices) []  - 0 Simple Staple / Suture removal (25 or less) []  - 0 Complex Staple / Suture removal (26 or more) []  - 0 Hypo/Hyperglycemic Management (do not check if billed separately) []  - 0 Ankle / Brachial Index (ABI) - do not check if billed separately Has the patient been seen at the hospital within the last three years: Yes Total Score: 40 Level Of Care: New/Established - Level 2 Electronic Signature(s) Signed: 02/13/2018 5:16:46 PM By: [name], BSN, RN, CWS, [name] RN, BSN Entered By: [name], BSN, RN, CWS, Best on 02/13/2018 15:10:34 [name] ([id number]) -------------------------------------------------------------------------------- Encounter Discharge Information Details Patient Name: [name], [name] Date of Service: 02/13/2018 2:30 PM Medical Record Number: [id number] Patient Account Number: [id number] Date of Birth/Sex: 1965-08-24 (53 y.o. M) Treating RN: [name] Coventry Primary Care Lerin Jech: [name] Cecil, Best Other Clinician: Referring Feliciano Wynter: [name] Cecil, Best Treating Taelor Waymire/Extender: [name] [place] in Treatment: 0 Encounter Discharge Information Items Discharge Condition: Stable Ambulatory Status: Wheelchair Discharge Destination: Home Transportation: Private Auto Accompanied By: mom Schedule Follow-up Appointment: Yes Clinical Summary of Care: Electronic Signature(s) Signed: 02/13/2018 3:25:31 PM By: [name], BSN, RN, CWS, [name] RN, BSN Entered By: [name], BSN, RN, CWS, Best on 02/13/2018 15:25:30 [name], [name] ([id number]) -------------------------------------------------------------------------------- Lower  Extremity Assessment Details Patient Name: Blower, Cato A. Date of Service: 02/13/2018 2:30 PM Medical Record Number: [id number] Patient Account Number: [id number] Date of Birth/Sex: [birth year] (53 y.o. M) Treating RN: [name] Jasmine Primary Care Chino Sardo: [name] Cecil, Best Other Clinician: Referring Nuel Dejaynes: [name] Cecil, Best Treating Rashon Rezek/Extender: [name] [place] in Treatment: 0 Electronic Signature(s) Signed: 02/13/2018 3:32:14 PM By: [name] Jasmine Entered By: [name] Jasmine on 02/13/2018 14:45:12 Arrasmith, [name] ([id number]) -------------------------------------------------------------------------------- Multi Wound Chart Details Patient Name: [name], [name] Date of Service: 02/13/2018 2:30 PM Medical Record Number: [id number] Patient Account Number: [id number] Date of Birth/Sex: 1965-06-20 (53 y.o. M) Treating RN: [name] Coventry Primary Care Emmersyn Kratzke: [name] Cecil, Best Other Clinician: Referring Tommi Crepeau: [name] Cecil, Best Treating Milta Croson/Extender: [name] [place] in Treatment: 0 Vital Signs Height(in): Pulse(bpm): 84 Weight(lbs): Blood Pressure(mmHg): 138/70 Body Mass Index(BMI): Temperature(F): 98.0 Respiratory Rate 16 (breaths/min): Photos: [N/A:N/A] Wound Location: Right Upper Leg - Posterior, N/A N/A Proximal Wounding Event: Pressure Injury N/A N/A Primary Etiology: Diabetic Wound/Ulcer of the N/A N/A Lower Extremity Comorbid History: Hypertension, Type II N/A N/A Diabetes, History of pressure wounds, Quadriplegia Date Acquired: 11/20/2017 N/A N/A Weeks of Treatment: 0 N/A N/A Wound Status: Open N/A N/A Measurements L x W x D 1x0.4x0.1 N/A N/A (cm) Area (cm) : 0.314 N/A N/A Volume (cm) : 0.031 N/A N/A Classification: Grade 2 N/A N/A Exudate Amount: None Present N/A N/A Granulation Amount: None Present (0%) N/A N/A Exposed Structures: Fascia: No N/A N/A Fat Layer (Subcutaneous Tissue) Exposed: No Tendon: No Muscle: No Joint: No Bone: No Debridement:  Chemical/Enzymatic/Mechanical N/A N/A Pre-procedure 15:08 N/A N/A Verification/Time Out Taken: Pain Control: Other N/A N/A Sherod, Makena A. ([id number]) Instrument: Other(saline and gauze) N/A N/A Bleeding: None N/A N/A Hemostasis Achieved: Pressure N/A N/A Debridement Treatment Procedure was tolerated well N/A N/A Response: Post Debridement 1x0.4x0.1 N/A N/A Measurements L x W x D (cm) Post Debridement Volume: 0.031 N/A N/A (cm) Periwound Skin Texture: No Abnormalities Noted N/A N/A Periwound Skin Moisture: Maceration: No N/A N/A Dry/Scaly: No Periwound Skin Color: No Abnormalities Noted N/A N/A Temperature: No Abnormality N/A N/A Tenderness on Palpation: No N/A N/A Wound Preparation: Ulcer Cleansing: N/A N/A Rinsed/Irrigated with Saline Topical Anesthetic Applied: None Procedures Performed: Debridement N/A  N/A Treatment Notes Electronic Signature(s) Signed: 02/13/2018 4:38:41 PM By: Baltazar Najjar MD Entered By: Baltazar Najjar on 02/13/2018 15:20:19 [name], [name] ([id number]) -------------------------------------------------------------------------------- Multi-Disciplinary Care Plan Details Patient Name: Cuffie, Ura A. Date of Service: 02/13/2018 2:30 PM Medical Record Number: [id number] Patient Account Number: [id number] Date of Birth/Sex: 01-08-1966 (53 y.o. M) Treating RN: [name] Coventry Primary Care Ermine Spofford: [name] Cecil, Best Other Clinician: Referring Kaushal Vannice: [name] Cecil, Best Treating Jamaira Sherk/Extender: [name] [place] in Treatment: 0 Active Inactive Electronic Signature(s) Signed: 02/13/2018 5:16:46 PM By: [name], BSN, RN, CWS, [name] RN, BSN Entered By: [name], BSN, RN, CWS, Best on 02/13/2018 15:05:26 Whitford, [name] ([id number]) -------------------------------------------------------------------------------- Pain Assessment Details Patient Name: [name], [name] Date of Service: 02/13/2018 2:30 PM Medical Record Number: [id number] Patient Account Number:  [id number] Date of Birth/Sex: 03/24/1965 (53 y.o. M) Treating RN: [name] Coventry Primary Care Fleeta Kunde: [name] Cecil, Best Other Clinician: Referring Hurley Blevins: [name] Cecil, Best Treating Icker Swigert/Extender: [name] [place] in Treatment: 0 Active Problems Location of Pain Severity and Description of Pain Patient Has Paino No Site Locations Pain Management and Medication Current Pain Management: Electronic Signature(s) Signed: 02/13/2018 5:16:46 PM By: [name], BSN, RN, CWS, [name] RN, BSN Signed: 02/14/2018 4:16:21 PM By: [name] Martes RCP, RRT, Best Entered By: [name] Martes on 02/13/2018 14:17:34 [name], [name] ([id number]) -------------------------------------------------------------------------------- Patient/Caregiver Education Details Patient Name: [name], [name] Date of Service: 02/13/2018 2:30 PM Medical Record Number: [id number] Patient Account Number: [id number] Date of Birth/Gender: 1965-11-04 (53 y.o. M) Treating RN: [name] Coventry Primary Care Physician: [name] Cecil, Best Other Clinician: Referring Physician: Azucena Cecil, Best Treating Physician/Extender: [name] [place] in Treatment: 0 Education Assessment Education Provided To: Patient Education Topics Provided Pressure: Handouts: Pressure Ulcers: Care and Offloading Methods: Demonstration, Explain/Verbal Responses: State content correctly Electronic Signature(s) Signed: 02/13/2018 5:16:46 PM By: [name], BSN, RN, CWS, [name] RN, BSN Entered By: [name], BSN, RN, CWS, Best on 02/13/2018 15:10:47 [name], [name] ([id number]) -------------------------------------------------------------------------------- Wound Assessment Details Patient Name: [name], [name] Date of Service: 02/13/2018 2:30 PM Medical Record Number: [id number] Patient Account Number: [id number] Date of Birth/Sex: 1965/04/29 (53 y.o. M) Treating RN: [name] Jasmine Primary Care Jacalyn Biggs: [name] Cecil, Best Other Clinician: Referring Gleason Ardoin: [name] Cecil,  Best Treating Paulene Tayag/Extender: [name] [place] in Treatment: 0 Wound Status Wound Number: 7 Primary Pressure Ulcer Etiology: Wound Location: Right Trochanter Wound Open Wounding Event: Pressure Injury Status: Date Acquired: 11/20/2017 Comorbid Hypertension, Type II Diabetes, History of Weeks Of Treatment: 0 History: pressure wounds, Quadriplegia Clustered Wound: No Photos Wound Measurements Length: (cm) 1 Width: (cm) 0.4 Depth: (cm) 0.1 Area: (cm) 0.314 Volume: (cm) 0.031 % Reduction in Area: 0% % Reduction in Volume: 0% Epithelialization: Large (67-100%) Tunneling: No Undermining: No Wound Description Classification: Category/Stage I Foul O Wound Margin: Flat and Intact Slough Exudate Amount: None Present dor After Cleansing: No /Fibrino No Wound Bed Granulation Amount: None Present (0%) Exposed Structure Necrotic Amount: None Present (0%) Fascia Exposed: No Fat Layer (Subcutaneous Tissue) Exposed: No Tendon Exposed: No Muscle Exposed: No Joint Exposed: No Bone Exposed: No Limited to Skin Breakdown Periwound Skin Texture Texture Color No Abnormalities Noted: No No Abnormalities Noted: No [name], Lino A. ([id number]) Callus: No Atrophie Blanche: No Crepitus: No Cyanosis: No Excoriation: No Ecchymosis: No Induration: No Erythema: No Rash: No Hemosiderin Staining: No Scarring: Yes Mottled: No Pallor: No Moisture Rubor: Yes No Abnormalities Noted: No Dry / Scaly: No Temperature / Pain Maceration: No Temperature: No Abnormality Wound Preparation Ulcer Cleansing: Rinsed/Irrigated with Saline Topical Anesthetic Applied: None Electronic Signature(s) Signed:  02/13/2018 3:23:46 PM By: [name] GurneyWoody, BSN, RN, CWS, [name] RN, BSN Signed: 02/13/2018 3:32:14 PM By: [name] Entered By: [name] GurneyWoody, BSN, RN, CWS, Best on 02/13/2018 15:23:46 Greaser, [name] ([id number]) -------------------------------------------------------------------------------- Vitals  Details Patient Name: Hawn, Alvaro A. Date of Service: 02/13/2018 2:30 PM Medical Record Number: [id number] Patient Account Number: [id number] Date of Birth/Sex: January 24, 1966 (53 y.o. M) Treating RN: [name] Primary Care Angeli Demilio: [name] Other Clinician: Referring Yanelis Osika: [name] Treating Osaze Hubbert/Extender: [name] Weeks in Treatment: 0 Vital Signs Time Taken: 14:17 Temperature (F): 98.0 Pulse (bpm): 84 Respiratory Rate (breaths/min): 16 Blood Pressure (mmHg): 138/70 Reference Range: 80 - 120 mg / dl Electronic Signature(s) Signed: 02/14/2018 4:16:21 PM By: [name], RCP,RRT,Best, [name] Entered By: [name], RCP,RRT,Best, [name] on 02/13/2018 14:18:51

## 2018-02-18 ENCOUNTER — Other Ambulatory Visit: Payer: Self-pay

## 2018-02-20 ENCOUNTER — Other Ambulatory Visit: Payer: Self-pay

## 2018-02-20 MED ORDER — INSULIN GLARGINE (2 UNIT DIAL) 300 UNIT/ML ~~LOC~~ SOPN: 60.0000 [IU] | PEN_INJECTOR | Freq: Every day | SUBCUTANEOUS | 2 refills | Status: DC

## 2018-03-19 ENCOUNTER — Other Ambulatory Visit: Payer: Medicare Other | Admitting: Internal Medicine

## 2018-06-17 ENCOUNTER — Ambulatory Visit (INDEPENDENT_AMBULATORY_CARE_PROVIDER_SITE_OTHER): Payer: Medicare Other | Admitting: Internal Medicine

## 2018-06-17 ENCOUNTER — Other Ambulatory Visit: Payer: Self-pay

## 2018-06-17 ENCOUNTER — Encounter: Payer: Self-pay | Admitting: Internal Medicine

## 2018-06-17 DIAGNOSIS — Z794 Long term (current) use of insulin: Secondary | ICD-10-CM | POA: Diagnosis not present

## 2018-06-17 DIAGNOSIS — E1165 Type 2 diabetes mellitus with hyperglycemia: Secondary | ICD-10-CM | POA: Diagnosis not present

## 2018-06-17 DIAGNOSIS — E785 Hyperlipidemia, unspecified: Secondary | ICD-10-CM | POA: Diagnosis not present

## 2018-06-17 NOTE — Progress Notes (Signed)
Patient ID: CORNEL Best, male   DOB: 04-13-1965, 53 y.o.   MRN: [id number]  Patient location: Home My location: Office  Referring Provider: Tally Joe, MD  I connected with the patient on 06/17/18 at 9:14 by telephone and verified that I am speaking with the correct person.   I discussed the limitations of evaluation and management by telephone and the availability of in person appointments. The patient expressed understanding and agreed to proceed.   Details of the encounter are shown below.  HPI: [name] is a 53 y.o.-year-old male, returning to f/u for DM2, dx in 2003, insulin-dependent, uncontrolled, without complications. Last visit 4 months ago.  The appointments are usually carried through his mother as patient cannot speak well. He has [employer].  Patient has SMA type III (motor neuron disease), dx 1972, and he is in the wheelchair.  His swallowing continues to worsen.  His mother is checking his sugars and give him the medicines.  Unfortunately, she occasionally gets sick (she was in the hospital 4 times last year) and cannot give him the medications. She recently had a cardiac stent placed.  He continues to have pressure ulcers on hips managed by wound care.  He is doing worse >> more weakness, a lot of productive cough and GERD. Eats soft foods. He is not at the point that he requires a tube feeding yet.  Reviewed HbA1c levels: Lab Results  Component Value Date   HGBA1C 8.7 (A) 02/12/2018   HGBA1C 8.1 (A) 10/09/2017   HGBA1C 8.0 06/06/2017  10/29/2017: HbA1c 8% 06/21/2015: HbA1c 8.2% 02/23/2015: HbA1C: 6.3% 12/15/2013: HbA1c 8.9% 06/12/2013: HbA1c 7.8% 12/13/2012: HbA1c 7.5% 12/07/2011: HbA1c 6.9% 05/30/2010: HbA1c 8.3%  Pt is on a regimen of: - Trulicity 1.5 mg in am - Humalog 18-26 units before a meal -2x a day - Toujeo 50-60 >> 60-80 units at bedtime If you do not eat b'fast, and sugars are >130, give 10 units of NovoLog. He tried Metformin x2  >> diarrhea. He was on Onglyza >> now stopped while on Trulicity.  Pt's mom is checking his sugars >4x a day with the Freestyle Libre CGM.  CBG: - am: 107-162 >> 90-138, 149 >> 150-180 >> n/c - 2h after b'fast: 123-197 >> 170-203 >> n/c  - before lunch: 61, 78-146, 150 >> see above >> 120-140 - 2h after lunch: 187-233, 287 >> 180-260 >> 180-200 - before dinner: 61, 189  >> 260-270 >> 140-160 - after dinner: 149, 215-238 >> 270-300 >> 200-300 - bedtime: 241-311, 345 >> 220-270 He has hypoglycemia awareness in the 70s. Lowest sugars, 61 >> 90s >> 140 Highest sugars, 345 >> 300s >> 300s  -No CKD, last BUN/creatinine:  10/29/2017: 3/0.21 Lab Results  Component Value Date   BUN 10 09/06/2016   CREATININE 0.21 (L) 09/06/2016  On lisinopril -+ HL;  last set of lipids: 10/29/2017: 114/218/34/36 Lab Results  Component Value Date   CHOL 174 09/06/2016   HDL 28.90 (L) 09/06/2016   LDLCALC 60 12/15/2013   LDLDIRECT 95.0 09/06/2016   TRIG (H) 09/06/2016    537.0 Triglyceride is over 400; calculations on Lipids are invalid.   CHOLHDL 6 09/06/2016  02/23/2015: 138/37/306/40  On Crestor 40, but could not start fenofibrate because of the large capsule. 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 - no numbness and tingling in his feet.  He still has sensation, but cannot move  ROS: Constitutional: no weight gain/no weight loss,+fatigue, no subjective  hyperthermia, no subjective hypothermia Eyes: no blurry vision, no xerophthalmia ENT: no sore throat, no nodules palpated in neck, no dysphagia, no odynophagia, no hoarseness Cardiovascular: no CP/no SOB/no palpitations/no leg swelling Respiratory: no cough/no SOB/no wheezing Gastrointestinal: no N/no V/no D/no C/+ acid reflux Musculoskeletal: + muscle aches/no joint aches Skin: no rashes, no hair loss Neurological: + tremors/no numbness/no tingling/no dizziness  I reviewed pt's medications, allergies, PMH, social hx, family hx, and  changes were documented in the history of present illness. Otherwise, unchanged from my initial visit note.  Past Surgical History:  Procedure Laterality Date  . muscle bisopy     History   Social History  . Marital Status: single    Spouse Name: N/A    Number of Children: 0   Occupational History  . disabled   Social History Main Topics  . Smoking status: Former Games developermoker  . Smokeless tobacco: Not on file  . Alcohol Use: No  . Drug Use: No   Current Outpatient Medications on File Prior to Visit  Medication Sig Dispense Refill  . BD PEN NEEDLE NANO U/F 32G X 4 MM MISC USE ONE NEEDLE DAILY WITH LANTUS SOLOSTAR AS DIRECTED 100 each 0  . CANASA 1000 MG suppository Place 1,000 mg rectally at bedtime as needed (for ulcerative proctitis).   1  . Continuous Blood Gluc Receiver (FREESTYLE LIBRE 14 DAY READER) DEVI USE WITH 14 DAYS SENSOR  0  . Continuous Blood Gluc Sensor (FREESTYLE LIBRE 14 DAY SENSOR) MISC 1 each by Does not apply route every 14 (fourteen) days. 6 each 3  . fenofibrate micronized (LOFIBRA) 134 MG capsule TAKE 1 CAPSULE BY MOUTH DAILY BEFORE BREAKFAST. 90 capsule 0  . glucose blood (ONETOUCH VERIO) test strip Use to test blood sugar 3 times daily as instructed. 300 each 11  . HUMALOG KWIKPEN 100 UNIT/ML KiwkPen INJECT 22-28 UNITS 3 TIMES DAILY INTO THE SKIN. 75 pen 1  . HYDROcodone-acetaminophen (HYCET) 7.5-325 mg/15 ml solution Take 10 mLs by mouth at bedtime.    . Insulin Glargine, 2 Unit Dial, (TOUJEO MAX SOLOSTAR) 300 UNIT/ML SOPN Inject 60 Units into the skin at bedtime. Inject 50-60 units daily at bedtime. 24 pen 2  . lisinopril (PRINIVIL,ZESTRIL) 10 MG tablet Take 10 mg by mouth daily.  3  . mirtazapine (REMERON) 15 MG tablet Take 15 mg by mouth at bedtime.    Marland Kitchen. omeprazole (PRILOSEC) 40 MG capsule Take 1 capsule (40 mg total) by mouth daily. 14 capsule 1  . ONETOUCH DELICA LANCETS 33G MISC Use to test blood sugar 2 times daily as instructed. 100 each 11  .  rosuvastatin (CRESTOR) 5 MG tablet Take 5 mg by mouth at bedtime.     . TRULICITY 1.5 MG/0.5ML SOPN INJECT 1.5 MG INTO THE SKIN ONCE WEEKLY 6 pen 1   No current facility-administered medications on file prior to visit.     Allergies  Allergen Reactions  . Bactrim Ds [Sulfamethoxazole-Trimethoprim] Other (See Comments)    Pill causes gagging  . Carafate [Sucralfate] Other (See Comments)    Gagging  . Latex Other (See Comments)    Extremely irritated, fire engine red skin  . Penicillins Hives    Has patient had a PCN reaction causing immediate rash, facial/tongue/throat swelling, SOB or lightheadedness with hypotension:unknown Has patient had a PCN reaction causing severe rash involving mucus membranes or skin necrosis:unknown Has patient had a PCN reaction that required hospitalization:unknown Has patient had a PCN reaction occurring within the last 10 years:infant  reaction If all of the above answers are "NO", then may proceed with Cephalosporin use.   . Clindamycin/Lincomycin Itching, Swelling and Rash   Family History  Problem Relation Age of Onset  . Diabetes Mother   . Colon polyps Mother   . Heart disease Father        CAD  . Diabetes Father    PE: There were no vitals taken for this visit. There is no height or weight on file to calculate BMI.   Wt Readings from Last 3 Encounters:  05/17/16 150 lb (68 kg)  07/25/15 165 lb (74.8 kg)  02/11/14 165 lb (74.8 kg)   Constitutional:  in NAD He is tetraplegic, in a wheelchair. The physical exam was not performed (telephone visit).   ASSESSMENT: 1. DM2, insulin-dependent, uncontrolled, without long term complications, But with hyperglycemia - We discussed briefly about Afrezza, the inhaled insulin, but I do not think this would be a good option for him. - We did discuss abou alternative regimen with Humalog 50/50 to reduce the number of injections, however, he is eating his first meal of the day between 12 and 2 PM and the  last meal of the day around 7 PM, and this would not work well with this type of regimen.  2. HL  PLAN:  1. Patient with longstanding, uncontrolled, type 2 diabetes, on basal-bolus insulin regimen and GLP-1 receptor agonist.  His mother is giving him the injection and checking his sugars with a libre CGM.  However, she is occasionally sick and admitted to the hospital and in this situation he is not getting his injections.  At last visit he was very sad that his dog that he had for 11 years died.  He was not eating much.  He lost weight and his sugars were high.  HbA1c was above target, at 8.7%.  Aside advising them to keep up with his Humalog injections, we did not change his regimen at that time. -At this visit, sugars are definitely improved, but they are using a higher dose of Toujeo.  Subsequently, he is dropping his sugars too much from evening to morning.  We discussed about backing off the Toujeo dose.  Since sugars are higher after meals, I advised him to use a higher dose of Humalog - I advised him to: Patient Instructions  Please continue: - Trulicity 1.5 mg in am  Please change: - Toujeo 60 units at bedtime - Humalog: 22-26 units before each meal  Please return in 4 months with your sugar log.   - We will check his HbA1c when he returns to the clinic - continue checking sugars at different times of the day - check 3x a day, rotating checks - advised for yearly eye exams >> he is not UTD - Return to clinic in 4 mo with sugar log     2. HL - Reviewed latest lipid panel from 2019: Triglycerides improved, HDL low - Continues Crestor 40 without side effects.  He cannot take fenofibrate due to the size of the pill and his problems with dysphagia.  - time spent with the patient: 11 min, of which >50% was spent in obtaining information about his symptoms, reviewing his previous labs, evaluations, and treatments, counseling him about his condition (please see the discussed topics  above), and developing a plan to further investigate and treat it.  Carlus Pavlov, MD PhD [facility]

## 2018-06-17 NOTE — Patient Instructions (Addendum)
Please continue: - Trulicity 1.5 mg in am  Please change: - Toujeo 60 units at bedtime - Humalog: 22-26 units before each meal  Please return in 4 months with your sugar log.

## 2018-08-13 ENCOUNTER — Other Ambulatory Visit: Payer: Self-pay | Admitting: Internal Medicine

## 2018-10-17 ENCOUNTER — Other Ambulatory Visit: Payer: Self-pay

## 2018-10-18 ENCOUNTER — Other Ambulatory Visit: Payer: Self-pay

## 2018-10-21 ENCOUNTER — Encounter: Payer: Self-pay | Admitting: Internal Medicine

## 2018-10-21 ENCOUNTER — Ambulatory Visit (INDEPENDENT_AMBULATORY_CARE_PROVIDER_SITE_OTHER): Payer: Medicare Other | Admitting: Internal Medicine

## 2018-10-21 ENCOUNTER — Other Ambulatory Visit: Payer: Self-pay

## 2018-10-21 VITALS — BP 120/70 | HR 84

## 2018-10-21 DIAGNOSIS — Z794 Long term (current) use of insulin: Secondary | ICD-10-CM | POA: Diagnosis not present

## 2018-10-21 DIAGNOSIS — E785 Hyperlipidemia, unspecified: Secondary | ICD-10-CM | POA: Diagnosis not present

## 2018-10-21 DIAGNOSIS — Z23 Encounter for immunization: Secondary | ICD-10-CM | POA: Diagnosis not present

## 2018-10-21 DIAGNOSIS — E1165 Type 2 diabetes mellitus with hyperglycemia: Secondary | ICD-10-CM

## 2018-10-21 LAB — LIPID PANEL
Cholesterol: 125 mg/dL (ref 0–200)
HDL: 34.7 mg/dL — ABNORMAL LOW (ref >39.00)
NonHDL: 89.99
Total CHOL/HDL Ratio: 4
Triglycerides: 302 mg/dL — ABNORMAL HIGH (ref 0.0–149.0)
VLDL: 60.4 mg/dL — ABNORMAL HIGH (ref 0.0–40.0)

## 2018-10-21 LAB — POCT GLYCOSYLATED HEMOGLOBIN (HGB A1C): Hemoglobin A1C: 9.2 % — AB (ref 4.0–5.6)

## 2018-10-21 LAB — LDL CHOLESTEROL, DIRECT: Direct LDL: 62 mg/dL

## 2018-10-21 MED ORDER — TRULICITY 1.5 MG/0.5ML ~~LOC~~ SOAJ: SUBCUTANEOUS | 2 refills | Status: DC

## 2018-10-21 NOTE — Patient Instructions (Addendum)
Please continue: - Toujeo 60 units at bedtime - Humalog 22-26 units before each meal  Increase: - Trulicity to 3 mg weekly in a.m.  Please return in 4 months with your sugar log.

## 2018-10-21 NOTE — Progress Notes (Signed)
Patient ID: [name], male   DOB: May 22, 1965, 53 y.o.   MRN: [id number]  HPI: [name] is a 53 y.o.-year-old male, returning to f/u for DM2, dx in 2003, insulin-dependent, uncontrolled, without complications. Last visit 4 months ago (virtual).  The appointment I usually carried through his mother as patient cannot speak well. He has [employer].  Patient has SMA type III (motor neuron disease), dx 1972, and he is in the wheelchair.  His swallowing continues to worsen.  His mother is checking his sugars and give him the medicines.  Unfortunately, she is frequently sick and cannot give him the medications.  His father cannot inject him with insulin as his hands are shaking. He did not have Humalog recently!  He continues to have pressure ulcers on hips managed by wound care.  He continues to have more weakness and GERD.  He eats soft foods.  He is not at the point at which he requires tube feeds yet.  He will start Evrysdi (risdiplam) for the SMA -in a research trial.   Reviewed his HbA1c levels: Lab Results  Component Value Date   HGBA1C 8.7 (A) 02/12/2018   HGBA1C 8.1 (A) 10/09/2017   HGBA1C 8.0 06/06/2017  10/29/2017: HbA1c 8% 06/21/2015: HbA1c 8.2% 02/23/2015: HbA1C: 6.3% 12/15/2013: HbA1c 8.9% 06/12/2013: HbA1c 7.8% 12/13/2012: HbA1c 7.5% 12/07/2011: HbA1c 6.9% 05/30/2010: HbA1c 8.3%  Pt is on a regimen of: - Trulicity 1.5 mg in the a.m. - Humalog 18-26 >>  - Toujeo 50-60 >> 60-80 >> 60 units at bedtime If you do not eat b'fast, and sugars are >130, give 10 units of NovoLog. He tried Metformin x2 >> diarrhea. He was on Onglyza >> now stopped while on Trulicity.  Patient's mom is checking his sugars more than 4 times a day with a freestyle libre CGM.  CBG:   Prev: - am: 107-162 >> 90-138, 149 >> 150-180 >> n/c - 2h after b'fast: 123-197 >> 170-203 >> n/c  - before lunch: 61, 78-146, 150 >> see above >> 120-140 - 2h after lunch: 187-233, 287 >> 180-260 >>  180-200 - before dinner: 61, 189  >> 260-270 >> 140-160 - after dinner: 149, 215-238 >> 270-300 >> 200-300 - bedtime: 241-311, 345 >> 220-270 He has hypoglycemia awareness in the 70s. Lowest sugars, 61 >> 90s >> 140 Highest sugars, 345 >> 300s >> 300s  -No CKD, last BUN/creatinine:  10/29/2017: 3/0.21 Lab Results  Component Value Date   BUN 10 09/06/2016   CREATININE 0.21 (L) 09/06/2016  On lisinopril. -+ HL;  last set of lipids: 10/29/2017: 114/218/34/36 Lab Results  Component Value Date   CHOL 174 09/06/2016   HDL 28.90 (L) 09/06/2016   LDLCALC 60 12/15/2013   LDLDIRECT 95.0 09/06/2016   TRIG (H) 09/06/2016    537.0 Triglyceride is over 400; calculations on Lipids are invalid.   CHOLHDL 6 09/06/2016  02/23/2015: 138/37/306/40  On Crestor 40.  He could not start fenofibrate because of the large capsule.   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 - no numbness and tingling in his feet.  He still has sensation, but cannot move  ROS: Constitutional: no weight gain/no weight loss, no fatigue, no subjective hyperthermia, no subjective hypothermia Eyes: no blurry vision, no xerophthalmia ENT: no sore throat, no nodules palpated in neck, + dysphagia, no odynophagia, no hoarseness Cardiovascular: no CP/no SOB/no palpitations/no leg swelling Respiratory: no cough/no SOB/no wheezing Gastrointestinal: no N/no V/no D/no C/+ acid reflux Musculoskeletal: +  muscle aches/no joint aches Skin: no rashes, no hair loss Neurological: + tremors/no numbness/no tingling/no dizziness, + generalized weakness  I reviewed pt's medications, allergies, PMH, social hx, family hx, and changes were documented in the history of present illness. Otherwise, unchanged from my initial visit note.  Past Surgical History:  Procedure Laterality Date  . muscle bisopy     History   Social History  . Marital Status: single    Spouse Name: N/A    Number of Children: 0   Occupational History  .  disabled   Social History Main Topics  . Smoking status: Former Research scientist (life sciences)  . Smokeless tobacco: Not on file  . Alcohol Use: No  . Drug Use: No   Current Outpatient Medications on File Prior to Visit  Medication Sig Dispense Refill  . BD PEN NEEDLE NANO U/F 32G X 4 MM MISC USE ONE NEEDLE DAILY WITH LANTUS SOLOSTAR AS DIRECTED 100 each 0  . CANASA 1000 MG suppository Place 1,000 mg rectally at bedtime as needed (for ulcerative proctitis).   1  . Continuous Blood Gluc Receiver (FREESTYLE LIBRE 14 DAY READER) DEVI USE WITH 14 DAYS SENSOR  0  . Continuous Blood Gluc Sensor (FREESTYLE LIBRE 14 DAY SENSOR) MISC 1 each by Does not apply route every 14 (fourteen) days. 6 each 3  . fenofibrate micronized (LOFIBRA) 134 MG capsule TAKE 1 CAPSULE BY MOUTH DAILY BEFORE BREAKFAST. 90 capsule 0  . glucose blood (ONETOUCH VERIO) test strip Use to test blood sugar 3 times daily as instructed. 300 each 11  . HUMALOG KWIKPEN 100 UNIT/ML KiwkPen INJECT 22-28 UNITS 3 TIMES DAILY INTO THE SKIN. 75 pen 1  . HYDROcodone-acetaminophen (HYCET) 7.5-325 mg/15 ml solution Take 10 mLs by mouth at bedtime.    . Insulin Glargine, 2 Unit Dial, (TOUJEO MAX SOLOSTAR) 300 UNIT/ML SOPN Inject 60 Units into the skin at bedtime. Inject 50-60 units daily at bedtime. 24 pen 2  . lisinopril (PRINIVIL,ZESTRIL) 10 MG tablet Take 10 mg by mouth daily.  3  . mirtazapine (REMERON) 15 MG tablet Take 15 mg by mouth at bedtime.    Marland Kitchen omeprazole (PRILOSEC) 40 MG capsule Take 1 capsule (40 mg total) by mouth daily. 14 capsule 1  . ONETOUCH DELICA LANCETS 19F MISC Use to test blood sugar 2 times daily as instructed. 100 each 11  . rosuvastatin (CRESTOR) 5 MG tablet Take 5 mg by mouth at bedtime.     . TRULICITY 1.5 XT/0.2IO SOPN INJECT 1.5 MG INTO THE SKIN ONCE WEEKLY 6 pen 2   No current facility-administered medications on file prior to visit.     Allergies  Allergen Reactions  . Bactrim Ds [Sulfamethoxazole-Trimethoprim] Other (See  Comments)    Pill causes gagging  . Carafate [Sucralfate] Other (See Comments)    Gagging  . Latex Other (See Comments)    Extremely irritated, fire engine red skin  . Penicillins Hives    Has patient had a PCN reaction causing immediate rash, facial/tongue/throat swelling, SOB or lightheadedness with hypotension:unknown Has patient had a PCN reaction causing severe rash involving mucus membranes or skin necrosis:unknown Has patient had a PCN reaction that required hospitalization:unknown Has patient had a PCN reaction occurring within the last 10 years:infant reaction If all of the above answers are "NO", then may proceed with Cephalosporin use.   . Clindamycin/Lincomycin Itching, Swelling and Rash   Family History  Problem Relation Age of Onset  . Diabetes Mother   . Colon polyps Mother   .  Heart disease Father        CAD  . Diabetes Father    PE: BP 120/70   Pulse 84   SpO2 99%  There is no height or weight on file to calculate BMI.  Could not be weighed today. Wt Readings from Last 3 Encounters:  05/17/16 150 lb (68 kg)  07/25/15 165 lb (74.8 kg)  02/11/14 165 lb (74.8 kg)   Constitutional: overweight, in NAD, in wheelchair.  He moves few muscle groups, but cannot talk and eat with difficulty Eyes: PERRLA, EOMI, no exophthalmos ENT: moist mucous membranes, no thyromegaly, no cervical lymphadenopathy Cardiovascular: RRR, No MRG, + bilateral leg swelling Respiratory: CTA B Gastrointestinal: abdomen soft, NT, ND, BS+ Musculoskeletal: + Bilateral muscle atrophy in hands Skin: moist, warm, no rashes Neurological: no tremor with outstretched hands, + tetraparesis  ASSESSMENT: 1. DM2, insulin-dependent, uncontrolled, without long term complications, But with hyperglycemia - We discussed briefly about Afrezza, the inhaled insulin, but I do not think this would be a good option for him. - We did discuss abou alternative regimen with Humalog 50/50 to reduce the number of  injections, however, he is eating his first meal of the day between 12 and 2 PM and the last meal of the day around 7 PM, and this would not work well with this type of regimen.  2. HL  PLAN:  1. Patient with longstanding, uncontrolled, type 2 diabetes, on basal-bolus insulin regimen and GLP-1 receptor agonist.  His mother is giving him the injections and checking his sugars with the libre CGM.  However, she occasionally gets sick and admitted to the hospital and in this situation he is not getting his injections.  At last visit, sugars were slightly better but they were using a higher dose of Toujeo.  Subsequently, he was dropping his sugars too much from evening to morning.  We decreased the dose of Toujeo and increase the Humalog dose. -At this visit, however, her sugars appear higher per review of his CGM downloads.  The sugars increase in a stepwise fashion especially after dinner.  We discussed that this is most likely due to his not getting any of Humalog lately as his mom was not available to inject the insulin.  Also, he is drinking Gatorade at night and I strongly advised him to stop.  At this visit, we will increase his Trulicity dose to 3 mg, which is a dose that was recently approved by FDA.  This is not that the pharmacy yet, but I advised him to take 2 injections of the 1.5 mg until he runs out of his medication and hopefully by that time, the pharmacy is will have this 3 mg dose in stock.  Otherwise, I would not change his regimen but he absolutely needs to take his Humalog. - I advised him to: Patient Instructions  Please continue: - Toujeo 60 units at bedtime - Humalog 22-26 units before each meal  Increase: - Trulicity to 3 mg weekly in a.m.  Please return in 4 months with your sugar log.   - we checked his HbA1c: 9.2% (higher) - advised to check sugars at different times of the day - 4x a day, rotating check times - advised for yearly eye exams >> he is not UTD - return to  clinic in 4 months    2. HL -Reviewed latest lipid panel from 2019: Triglycerides improved, HDL low -Continues Crestor 40 without side effects.  He cannot take fenofibrate due to the size  of the pills and his dysphagia -We will check lipids today  Component     Latest Ref Rng & Units 10/21/2018  Glucose     65 - 99 mg/dL 161228 (H)  BUN     7 - 25 mg/dL 5 (L)  Creatinine     0.960.70 - 1.33 mg/dL 0.450.22 (L)  GFR, Est Non African American     > OR = 60 mL/min/1.2673m2 175  GFR, Est African American     > OR = 60 mL/min/1.373m2 202  BUN/Creatinine Ratio     6 - 22 (calc) 23 (H)  Sodium     135 - 146 mmol/L 136  Potassium     3.5 - 5.3 mmol/L 4.0  Chloride     98 - 110 mmol/L 99  CO2     20 - 32 mmol/L 24  Calcium     8.6 - 10.3 mg/dL 9.5  Total Protein     6.1 - 8.1 g/dL 6.8  Albumin MSPROF     3.6 - 5.1 g/dL 3.9  Globulin     1.9 - 3.7 g/dL (calc) 2.9  AG Ratio     1.0 - 2.5 (calc) 1.3  Total Bilirubin     0.2 - 1.2 mg/dL 0.3  Alkaline phosphatase (APISO)     35 - 144 U/L 37  AST     10 - 35 U/L 33  ALT     9 - 46 U/L 28  Cholesterol     0 - 200 mg/dL 409125  Triglycerides     0.0 - 149.0 mg/dL 811.9302.0 (H)  HDL Cholesterol     >39.00 mg/dL 14.7834.70 (L)  VLDL     0.0 - 40.0 mg/dL 29.560.4 (H)  Total CHOL/HDL Ratio      4  NonHDL      89.99  Direct LDL     mg/dL 62.162.0   TG still elevated. Glu high.  Carlus Pavlovristina Shondell Fabel, MD PhD [facility]

## 2018-10-22 LAB — COMPLETE METABOLIC PANEL WITH GFR
AG Ratio: 1.3 (calc) (ref 1.0–2.5)
ALT: 28 U/L (ref 9–46)
AST: 33 U/L (ref 10–35)
Albumin: 3.9 g/dL (ref 3.6–5.1)
Alkaline phosphatase (APISO): 37 U/L (ref 35–144)
BUN/Creatinine Ratio: 23 (calc) — ABNORMAL HIGH (ref 6–22)
BUN: 5 mg/dL — ABNORMAL LOW (ref 7–25)
CO2: 24 mmol/L (ref 20–32)
Calcium: 9.5 mg/dL (ref 8.6–10.3)
Chloride: 99 mmol/L (ref 98–110)
Creat: 0.22 mg/dL — ABNORMAL LOW (ref 0.70–1.33)
GFR, Est African American: 202 mL/min/{1.73_m2} (ref >=60)
GFR, Est Non African American: 175 mL/min/{1.73_m2} (ref >=60)
Globulin: 2.9 g/dL (calc) (ref 1.9–3.7)
Glucose, Bld: 228 mg/dL — ABNORMAL HIGH (ref 65–99)
Potassium: 4 mmol/L (ref 3.5–5.3)
Sodium: 136 mmol/L (ref 135–146)
Total Bilirubin: 0.3 mg/dL (ref 0.2–1.2)
Total Protein: 6.8 g/dL (ref 6.1–8.1)

## 2018-10-23 ENCOUNTER — Telehealth: Payer: Self-pay

## 2018-10-23 NOTE — Telephone Encounter (Signed)
Left message for patient to return our call at [phone].  

## 2018-10-23 NOTE — Telephone Encounter (Signed)
-----   Message from Philemon Kingdom, MD sent at 10/22/2018  5:50 PM EDT ----- [name], can you please call pt: His TG still elevated. Glu high. The rest of the labs are okay.

## 2018-10-23 NOTE — Telephone Encounter (Signed)
Notified patient Mother of message from Dr. Cruzita Lederer, expressed understanding and agreement. No further questions.

## 2018-12-02 ENCOUNTER — Other Ambulatory Visit: Payer: Self-pay | Admitting: Internal Medicine

## 2018-12-02 ENCOUNTER — Telehealth: Payer: Self-pay | Admitting: Internal Medicine

## 2018-12-02 MED ORDER — TRULICITY 3 MG/0.5ML ~~LOC~~ SOAJ: 3.0000 mg | SUBCUTANEOUS | 3 refills | Status: DC

## 2018-12-02 NOTE — Telephone Encounter (Signed)
Patient's mom Izora Gala called re: Dr. Cruzita Lederer increased patient's Trulicity to 2 injections per week. Pharmacist advised that Dr. Cruzita Lederer needs to send  a new Rx for Trulicity with the new dosage to: [facility] [place], [place] [phone] (Phone) [phone] (Fax)

## 2018-12-02 NOTE — Telephone Encounter (Signed)
Please advise 

## 2018-12-02 NOTE — Telephone Encounter (Signed)
Ok, I sent it

## 2019-01-02 LAB — HEMOGLOBIN A1C: Hemoglobin A1C: 9.5

## 2019-01-03 ENCOUNTER — Encounter: Payer: Self-pay | Admitting: Internal Medicine

## 2019-01-14 ENCOUNTER — Telehealth: Payer: Self-pay

## 2019-01-14 NOTE — Telephone Encounter (Signed)
Phone call placed to patient's mom, Izora Gala, to check in and to offer to schedule a visit with Enid Derry NP. Izora Gala shared that patient has demonstrated increased weakness with difficulty sitting up. Patient was approved for a trial medication that may help increase his strength to upper body. Patient continues with thick productive cough. Case worker for [employer] is attempting to get patient a suction machine and an Museum/gallery conservator lift. Patient approved for 35 hours in home care, although, only currently has an aide for 2 hours per day. Telephonic visit scheduled for Friday 01/17/2019 with ShirleyNP

## 2019-01-17 ENCOUNTER — Other Ambulatory Visit: Payer: Self-pay

## 2019-01-17 ENCOUNTER — Other Ambulatory Visit: Payer: Medicare Other | Admitting: Internal Medicine

## 2019-01-17 DIAGNOSIS — Z515 Encounter for palliative care: Secondary | ICD-10-CM

## 2019-01-17 DIAGNOSIS — R531 Weakness: Secondary | ICD-10-CM

## 2019-01-27 NOTE — Progress Notes (Signed)
[facility], [facility] Palliative Care Consult Note Telephone: [phone]  Fax: [phone]  PATIENT NAME: [name] DOB: 1965-08-28 MRN: [id number]  PRIMARY CARE PROVIDER:   Antony Contras, MD  REFERRING PROVIDER:  Antony Contras, MD [place],  [place] [address]  RESPONSIBLE PARTY:   Self and [name] (mother)   RECOMMENDATIONS and PLAN:  Palliative Care Encounter  Z51.5  1.  Advance care planning:  Plans remain for patient to continue to reside at home and receive personal care from parents.  Long term goals are to prevent hospitalization and continue supportive care as long as possible. Palliative care will f/u with patient in 1-2 months.  2.  Weakness:  Chronic with advancement.  Continue participation in study as directed for hopes of upper body strength improvement.  Continue daily total care as assisted by CAPs worker which will definitely help his mother who is in poor health.  Monitor for additional decline and consideration for transition to Hospice care when significant decline occurs.  3. Dysphagia:  Aspiration risk is high.  Precautions reviewed again. Soft foods and thickener in beverages as needed.    Due to the COVID-19 crisis, this visit occurred telephonically and was initiated and consented by the patient and  Or family.  I spent 40 minutes providing this consultation,  from 1100 to 1140. More than 50% of the time in this consultation was spent coordinating communication with patient and his mother.   HISTORY OF PRESENT ILLNESS: Follow-up with [name]  Mother reports that his upper body has become weaker than previous months however, he is currently taking a trial medication in hopes to improve this strength. He continues to have difficulty with swallowing but no reports of obvious aspirations.  Denies recent illnesses or injuries. He remains as total care and is now receiving assistance from a CAP  assistance daily. Palliative Care was asked to help address goals of care.   CODE STATUS: FULL CODE  PPS: 30% HOSPICE ELIGIBILITY/DIAGNOSIS: TBD  PAST MEDICAL HISTORY:  Past Medical History:  Diagnosis Date  . Diabetes mellitus without complication ([place])    DM Type II   . GERD (gastroesophageal reflux disease)   . Hypercholesterolemia   . Muscular dystrophy ([place])      PERTINENT MEDICATIONS:  Outpatient Encounter Medications as of 01/17/2019  Medication Sig  . BD PEN NEEDLE NANO U/F 32G X 4 MM MISC USE ONE NEEDLE DAILY WITH LANTUS SOLOSTAR AS DIRECTED  . CANASA 1000 MG suppository Place 1,000 mg rectally at bedtime as needed (for ulcerative proctitis).   . Continuous Blood Gluc Receiver (FREESTYLE LIBRE 14 DAY READER) DEVI USE WITH 14 DAYS SENSOR  . Continuous Blood Gluc Sensor (FREESTYLE LIBRE 14 DAY SENSOR) MISC 1 each by Does not apply route every 14 (fourteen) days.  . Dulaglutide (TRULICITY) 3 XB/3.5HG SOPN Inject 3 mg into the skin once a week.  . fenofibrate micronized (LOFIBRA) 134 MG capsule TAKE 1 CAPSULE BY MOUTH DAILY BEFORE BREAKFAST.  Marland Kitchen glucose blood (ONETOUCH VERIO) test strip Use to test blood sugar 3 times daily as instructed.  Marland Kitchen HUMALOG KWIKPEN 100 UNIT/ML KiwkPen INJECT 22-28 UNITS 3 TIMES DAILY INTO THE SKIN.  . HYDROcodone-acetaminophen (HYCET) 7.5-325 mg/15 ml solution Take 10 mLs by mouth at bedtime.  . Insulin Glargine, 2 Unit Dial, (TOUJEO MAX SOLOSTAR) 300 UNIT/ML SOPN Inject 60 Units into the skin at bedtime. Inject 50-60 units daily at bedtime.  Marland Kitchen lisinopril (PRINIVIL,ZESTRIL) 10  MG tablet Take 10 mg by mouth daily.  . mirtazapine (REMERON) 15 MG tablet Take 15 mg by mouth at bedtime.  Marland Kitchen omeprazole (PRILOSEC) 40 MG capsule Take 1 capsule (40 mg total) by mouth daily.  Letta Pate DELICA LANCETS 33G MISC Use to test blood sugar 2 times daily as instructed.  . rosuvastatin (CRESTOR) 5 MG tablet Take 5 mg by mouth at bedtime.    No facility-administered  encounter medications on file as of 01/17/2019.    PHYSICAL EXAM:   General: No obvious acute distress noted in conversation Pulmonary: speech is not forced Neurological: alert and oriented.   Psych:  Calm and cooperative  [name] Sheffield, NP-C

## 2019-01-29 ENCOUNTER — Other Ambulatory Visit: Payer: Self-pay

## 2019-01-29 ENCOUNTER — Encounter: Payer: Self-pay | Admitting: Podiatry

## 2019-01-29 ENCOUNTER — Ambulatory Visit: Payer: Medicare Other | Admitting: Podiatry

## 2019-01-29 DIAGNOSIS — L03031 Cellulitis of right toe: Secondary | ICD-10-CM

## 2019-01-29 NOTE — Patient Instructions (Signed)

## 2019-01-29 NOTE — Progress Notes (Signed)
Subjective:   Patient ID: [name], male   DOB: 53 y.o.   MRN: [id number]   HPI Patient presents with caregiver with a chronic nail disease right big toe that has not been seen for for a long period of time and states that it has gotten worse over the last few weeks and his caregiver wanted it checked    ROS      Objective:  Physical Exam  Neurovascular status unchanged with patient in a wheelchair with chronic spinal issue and muscular dystrophy.  Is found to have a damaged right hallux nail with redness and drainage both medial lateral side localized with no proximal edema erythema drainage noted      Assessment:  Chronic paronychia infection right hallux that is become more symptomatic both medial lateral side     Plan:  H&P reviewed condition with him and caregiver.  Today I anesthetized the toe 60 g like Marcaine mixture sterile prep was applied and using sterile instrumentation I remove the medial lateral borders I noted there to be drainage bilateral and clean the borders out in all necrotic tissue flushing the tissue.  I then remove the rest the nail that got loose flushed out the tissue applied sterile dressing instructed on soaks and reappoint as symptoms were to indicate.  Will need to be done periodically

## 2019-01-30 ENCOUNTER — Telehealth: Payer: Self-pay

## 2019-01-30 ENCOUNTER — Telehealth: Payer: Self-pay | Admitting: Podiatry

## 2019-01-30 ENCOUNTER — Other Ambulatory Visit: Payer: Self-pay

## 2019-01-30 MED ORDER — CEPHALEXIN 500 MG PO CAPS: 500.0000 mg | ORAL_CAPSULE | Freq: Three times a day (TID) | ORAL | 0 refills | Status: AC

## 2019-01-30 NOTE — Telephone Encounter (Signed)
I spoke with patient's mother, Izora Gala, at length. Patient was in pain all night last night after his nail procedure yesterday. He already takes hydrocodone at night for chronic back pain. Patient is a paraplegic and it is difficult to bring him in to the office. And she was unsure how to take a picture and send to [place]. She claims that his whole foot is red and warm to the touch and appears to be moving up his leg.  They did receive soaking instructions, but did not receive Rx for antibiotic drops due to his allergies to clindamycin, bactrim, and penicillin. She is asking for oral antibiotics and [facility] [address] [facility] as [facility] their home.  Please advise ASAP Thanks  Nira Conn

## 2019-01-30 NOTE — Telephone Encounter (Signed)
ERROR

## 2019-01-30 NOTE — Telephone Encounter (Signed)
Spoke to [place] and advised that we will call in Keflex 500mg  TID x 10 days. Advised that if he did not improve or if signs of infection increased to go directly to the ED.  Nira Conn

## 2019-02-17 ENCOUNTER — Ambulatory Visit: Payer: Medicare Other | Admitting: Internal Medicine

## 2019-02-21 ENCOUNTER — Other Ambulatory Visit: Payer: Self-pay | Admitting: Internal Medicine

## 2019-02-25 ENCOUNTER — Telehealth: Payer: Self-pay | Admitting: Internal Medicine

## 2019-02-25 ENCOUNTER — Other Ambulatory Visit: Payer: Self-pay | Admitting: Internal Medicine

## 2019-02-25 NOTE — Telephone Encounter (Signed)
[name] with [facility] [place] ph# [phone] called re: [name] has been attempting to send a message with a PA for a [facility] but it keeps coming back as rejected. [name] states that PA is needed for the [facility] and requests our office call [facility] at ph# [phone]/ [facility] ph# [phone] for PA.Marland Kitchen

## 2019-02-26 MED ORDER — FREESTYLE LIBRE 14 DAY SENSOR MISC: 4 refills | Status: DC

## 2019-02-26 MED ORDER — FREESTYLE LIBRE 14 DAY READER DEVI: 1.0000 | 0 refills | Status: AC

## 2019-02-26 NOTE — Telephone Encounter (Signed)
RX sent to [facility].

## 2019-03-05 ENCOUNTER — Telehealth: Payer: Self-pay | Admitting: Internal Medicine

## 2019-03-05 MED ORDER — FREESTYLE LIBRE 14 DAY SENSOR MISC: 4 refills | Status: DC

## 2019-03-05 NOTE — Telephone Encounter (Signed)
Patient's mother Harriett Sine called re: reuests that RX that was sent to [facility] for [employer] be sent as follows:  MEDICATION: Continuous Blood Gluc Sensor (FREESTYLE LIBRE 14 DAY SENSOR) MISC  [facility]   [id number] [place] [place] - [address] Phone:  [phone]  Fax:  [phone]      IS THIS A 90 DAY SUPPLY : No  IS PATIENT OUT OF MEDICATION: No  IF NOT; HOW MUCH IS LEFT: 1  LAST APPOINTMENT DATE: @09 /21/2020  NEXT APPOINTMENT DATE:@03 /23/2021  DO WE HAVE YOUR PERMISSION TO LEAVE A DETAILED MESSAGE: Yes  OTHER COMMENTS:    **Let patient know to contact pharmacy at the end of the day to make sure medication is ready. **  ** Please notify patient to allow 48-72 hours to process**  **Encourage patient to contact the pharmacy for refills or they can request refills through [facility]**

## 2019-03-05 NOTE — Telephone Encounter (Signed)
RX sent

## 2019-03-17 ENCOUNTER — Telehealth: Payer: Self-pay

## 2019-03-17 NOTE — Telephone Encounter (Signed)
Form for Continous Glucose Monitor and Certificate/Letter of Medical Neccescity have been received, completed, and signed by Dr. Gherghe.  Completed form has been faxed to [facility] with confirmation.  

## 2019-03-18 ENCOUNTER — Other Ambulatory Visit: Payer: Medicare Other | Admitting: Internal Medicine

## 2019-03-19 ENCOUNTER — Other Ambulatory Visit: Payer: Self-pay

## 2019-04-17 ENCOUNTER — Other Ambulatory Visit: Payer: Medicare Other | Admitting: Internal Medicine

## 2019-04-17 ENCOUNTER — Other Ambulatory Visit: Payer: Self-pay

## 2019-04-22 ENCOUNTER — Ambulatory Visit: Payer: Medicare Other | Admitting: Internal Medicine

## 2019-05-29 ENCOUNTER — Other Ambulatory Visit: Payer: Self-pay

## 2019-05-29 ENCOUNTER — Other Ambulatory Visit: Payer: Medicare Other | Admitting: Internal Medicine

## 2019-05-29 DIAGNOSIS — Z515 Encounter for palliative care: Secondary | ICD-10-CM

## 2019-05-29 DIAGNOSIS — G121 Other inherited spinal muscular atrophy: Secondary | ICD-10-CM

## 2019-05-29 NOTE — Progress Notes (Signed)
[facility] Palliative Care Consult Note Telephone: [phone]  Fax: [phone]  PATIENT NAME: [name] DOB: 03-Feb-1965 MRN: [id number]  PRIMARY CARE PROVIDER:   Antony Contras, MD  REFERRING PROVIDER: Dr. Tedra Coupe  RESPONSIBLE PARTY:   Self and parents      RECOMMENDATIONS and PLAN:  Palliative care Encounter  Z51.5  1. Advance care planning:  Re-addressed advanced directives and goals of care. Patient changed code status to DNR. DNAR form completed and left with mother.  MOST form reviewed/copy left in home and encouraged he and parents to have a discussion related to same.  He would also like to discuss with Dr. Vallarie Mare during his visit in 1 week.  His goal is to remain at home and have parents to continue to provide care. Encourage to try new methods for personal care that would benefit he and elderly parents.  Palliative care will re-discuss during next visit in aprox 6 weeks.   2.  Dysphagia:  Additional decline even with small bites of food.  Heavy secretion production.  He would benefit from a portable suction machine to clear secretions.  Aspiration precautions reviewed. Potential use of re-evaluation by speech therapist.  Monitor  3.  General weakness:  Additional decline.  Related to advanced neuromuscular disease. Declining even with use of research drug.  Pending appt. With Dr. Vallarie Mare for additional evaluation.  Consider transition to Hospice if increased weakness not related to research drug.  Assisted pt and parents with securing head in upright position.  Recommended use of repositioning body length sling but pt is resistant to same. Support given.  I spent 60 minutes providing this consultation,  from 1300 to 1400. More than 50% of the time in this consultation was spent coordinating communication with patient and parents.  HISTORY OF PRESENT ILLNESS: Follow-up with Odie Sera.  Pt and mother report increased weakness of neck  and shoulders.  He has much greater difficulty in keeping head upright and is unable to lift his head from chest level.  He continues to have large amounts of oral secretions and "chokes" easily.  New cough assist device in home but pt feels as thought it brings stomach contents into his mouth.  Hesitant for use.  Palliative Care was asked to help address goals of care.   CODE STATUS: DNAR  PPS: 20% HOSPICE ELIGIBILITY/DIAGNOSIS: TBD  PAST MEDICAL HISTORY:  Past Medical History:  Diagnosis Date  . Diabetes mellitus without complication ([place])    DM Type II   . GERD (gastroesophageal reflux disease)   . Hypercholesterolemia   . Muscular dystrophy ([place])     SOCIAL HX: Lives with elderly parents/caregivers     PERTINENT MEDICATIONS:  Outpatient Encounter Medications as of 05/29/2019  Medication Sig  . BD PEN NEEDLE NANO U/F 32G X 4 MM MISC USE ONE NEEDLE DAILY WITH LANTUS SOLOSTAR AS DIRECTED  . CANASA 1000 MG suppository Place 1,000 mg rectally at bedtime as needed (for ulcerative proctitis).   . Continuous Blood Gluc Receiver (FREESTYLE LIBRE 14 DAY READER) DEVI 1 Device by Does not apply route See admin instructions. Use with 14 day sensor  . Continuous Blood Gluc Sensor (FREESTYLE LIBRE 14 DAY SENSOR) MISC APPLY EVERY 14 (FOURTEEN) DAYS.  . Dulaglutide (TRULICITY) 3 QQ/2.2LN SOPN Inject 3 mg into the skin once a week.  . EVRYSDI 0.75 MG/ML SOLR   . fenofibrate micronized (LOFIBRA) 134 MG capsule TAKE 1 CAPSULE BY MOUTH DAILY BEFORE BREAKFAST.  Marland Kitchen  gabapentin (NEURONTIN) 250 MG/5ML solution gabapentin 250 mg/5 mL oral solution  . glucose blood (ONETOUCH VERIO) test strip Use to test blood sugar 3 times daily as instructed.  Marland Kitchen HUMALOG KWIKPEN 100 UNIT/ML KiwkPen INJECT 22-28 UNITS 3 TIMES DAILY INTO THE SKIN.  . HYDROcodone-acetaminophen (HYCET) 7.5-325 mg/15 ml solution Take 10 mLs by mouth at bedtime.  Marland Kitchen lisinopril (PRINIVIL,ZESTRIL) 10 MG tablet Take 10 mg by mouth daily.  .  mirtazapine (REMERON) 15 MG tablet Take 15 mg by mouth at bedtime.  Marland Kitchen omeprazole (PRILOSEC) 40 MG capsule Take 1 capsule (40 mg total) by mouth daily.  Letta Pate DELICA LANCETS 33G MISC Use to test blood sugar 2 times daily as instructed.  . pantoprazole (PROTONIX) 40 MG tablet Take 40 mg by mouth daily.  . rosuvastatin (CRESTOR) 5 MG tablet Take 5 mg by mouth at bedtime.   . TOUJEO MAX SOLOSTAR 300 UNIT/ML SOPN INJECT 50-60 UNITS DAILY AT BEDTIME.   No facility-administered encounter medications on file as of 05/29/2019.    PHYSICAL EXAM:   General: NAD, frail appearing, thin Cardiovascular: regular rate and rhythm Pulmonary: clear and diminished in bases Abdomen: soft, nontender, + bowel sounds HE:NIDPO catheter in place and patent of clear amber urine Extremities: 1+ edema of BLE Skin: exposed skin is intact Neurological: A&O x3.  Generalized increased paresis.  Inability to lift and turn head. Very minimal gross motor movement of R hand.  Head secured in upright position by NP with use of elastic band around ball cap strap and head rest.  Psych:  Normal happy mood with new puppy in the home  Margaretha Sheffield, NP-C

## 2019-06-26 ENCOUNTER — Other Ambulatory Visit: Payer: Medicare Other | Admitting: Internal Medicine

## 2019-07-16 ENCOUNTER — Other Ambulatory Visit: Payer: Self-pay | Admitting: Internal Medicine

## 2019-07-18 ENCOUNTER — Other Ambulatory Visit: Payer: Self-pay | Admitting: Internal Medicine

## 2019-07-24 ENCOUNTER — Other Ambulatory Visit: Payer: Self-pay | Admitting: Internal Medicine

## 2019-07-25 ENCOUNTER — Other Ambulatory Visit: Payer: Self-pay | Admitting: Internal Medicine

## 2019-07-28 ENCOUNTER — Ambulatory Visit: Payer: Medicare Other | Admitting: Internal Medicine

## 2019-07-30 ENCOUNTER — Telehealth: Payer: Self-pay | Admitting: Internal Medicine

## 2019-07-30 MED ORDER — TRULICITY 3 MG/0.5ML ~~LOC~~ SOAJ: SUBCUTANEOUS | 1 refills | Status: DC

## 2019-07-30 MED ORDER — TOUJEO MAX SOLOSTAR 300 UNIT/ML ~~LOC~~ SOPN: PEN_INJECTOR | SUBCUTANEOUS | 1 refills | Status: DC

## 2019-07-30 MED ORDER — INSULIN LISPRO (1 UNIT DIAL) 100 UNIT/ML (KWIKPEN): PEN_INJECTOR | SUBCUTANEOUS | 1 refills | Status: DC

## 2019-07-30 NOTE — Telephone Encounter (Signed)
All have been sent.

## 2019-07-30 NOTE — Telephone Encounter (Signed)
Patient's mother called -she has been hospitalized and had to cancel patient's appointment for 07/28/19 due to her being unable to bring patient to his appointment (patiient was rescheduled for appointment on 10/28/19) and requests the following RX's :  Medication Refill Request  Did you call your pharmacy and request this refill first? No-mother was told they could not request refills until patient is seen. . If patient has not contacted pharmacy first, instruct them to do so for future refills.  . Remind them that contacting the pharmacy for their refill is the quickest method to get the refill.  . Refill policy also stated that it will take anywhere between 24-72 hours to receive the refill.    Name of medication?  HUMALOG KWIKPEN 100 UNIT/ML KiwkPen, AND  TOUJEO MAX SOLOSTAR 300 UNIT/ML Solostar Pen (sent on 07/24/19 to wrong PHARM) AND TRULICITY 3 MG/0.5ML SOPN (enough to last until rescheduled appt on 10/28/19) Is this a 90 day supply? Yes  Name and location of pharmacy?   [facility] - [place], [place] - [address] Phone:  [phone]  Fax:  [phone]      . Is the request for diabetes test strips? No . If yes, what brand? N/A

## 2019-08-04 NOTE — Progress Notes (Signed)
Therapist, nutritional Palliative Care Consult Note Telephone: [phone]  Fax: [phone]  PATIENT NAME: [name] DOB: [birth year] MRN: [id number]  PRIMARY CARE PROVIDER:   Tally Joe, MD  REFERRING PROVIDER: Dr. Janan Ridge  RESPONSIBLE PARTY:   Self and parents      RECOMMENDATIONS and PLAN:  Palliative care Encounter  Z51.5  1. Advance care planning: Code status is DNAR and was agreed upon by Dr. Alphonzo Dublin.  His goal is to remain at home and have parents to continue to provide care as long as possible. Encouraged additional caregiving plans as patient becomes weaker.  Palliative care will re-discuss during next visit in aprox 6 weeks.   2.  Dysphagia:  Additional decline even with small bites of food.  Heavy secretion production.  Pending arrival of  portable suction machine to clear secretions.  Aspiration precautions reviewed. Re-evaluation by speech therapist for dietary recommendations (soft vs pureed meals)  Monitor  3.  General weakness:  Additional decline.  Related to advanced neuromuscular disease.  Consider transition to Hospice if increased weakness and aspiration occur.  Recommended use of repositioning body length sling again. Support given.  I spent 30 minutes providing this consultation,  from 0930 to 1000. More than 50% of the time in this consultation was spent coordinating communication with patient and mother.  Due to the COVID-19 crisis, this visit occurred from my office and was initiated and consented by patient and or family.  HISTORY OF PRESENT ILLNESS: Follow-up with [name]  Pt and mother report increased weakness of neck and shoulders. Greater difficulty in raising his head. Pending delivery of additional equipment for motorized chair for head support.  He continues to have large amounts of oral secretions and "chokes" easily.Pending delivery of suction unit.  Palliative Care was asked to help address goals of care.    CODE STATUS: DNAR  PPS: 20% HOSPICE ELIGIBILITY/DIAGNOSIS: TBD      Patient declines HC at this time  PAST MEDICAL HISTORY:  Past Medical History:  Diagnosis Date  . Diabetes mellitus without complication ([facility])    DM Type II   . GERD (gastroesophageal reflux disease)   . Hypercholesterolemia   . Muscular dystrophy ([facility])     SOCIAL HX: Lives with elderly parents/caregivers.    Brother recently expired from cancer.     PERTINENT MEDICATIONS:  Outpatient Encounter Medications as of 06/26/2019  Medication Sig  . BD PEN NEEDLE NANO U/F 32G X 4 MM MISC USE ONE NEEDLE DAILY WITH LANTUS SOLOSTAR AS DIRECTED  . CANASA 1000 MG suppository Place 1,000 mg rectally at bedtime as needed (for ulcerative proctitis).   . Continuous Blood Gluc Receiver (FREESTYLE LIBRE 14 DAY READER) DEVI 1 Device by Does not apply route See admin instructions. Use with 14 day sensor  . Continuous Blood Gluc Sensor (FREESTYLE LIBRE 14 DAY SENSOR) MISC APPLY EVERY 14 (FOURTEEN) DAYS.  Marland Kitchen EVRYSDI 0.75 MG/ML SOLR   . fenofibrate micronized (LOFIBRA) 134 MG capsule TAKE 1 CAPSULE BY MOUTH DAILY BEFORE BREAKFAST.  Marland Kitchen gabapentin (NEURONTIN) 250 MG/5ML solution gabapentin 250 mg/5 mL oral solution  . glucose blood (ONETOUCH VERIO) test strip Use to test blood sugar 3 times daily as instructed.  Marland Kitchen HYDROcodone-acetaminophen (HYCET) 7.5-325 mg/15 ml solution Take 10 mLs by mouth at bedtime.  Marland Kitchen lisinopril (PRINIVIL,ZESTRIL) 10 MG tablet Take 10 mg by mouth daily.  . mirtazapine (REMERON) 15 MG tablet Take 15 mg by mouth at bedtime.  Marland Kitchen omeprazole (PRILOSEC)  40 MG capsule Take 1 capsule (40 mg total) by mouth daily.  Letta Pate DELICA LANCETS 33G MISC Use to test blood sugar 2 times daily as instructed.  . pantoprazole (PROTONIX) 40 MG tablet Take 40 mg by mouth daily.  . rosuvastatin (CRESTOR) 5 MG tablet Take 5 mg by mouth at bedtime.   . [DISCONTINUED] Dulaglutide (TRULICITY) 3 MG/0.5ML SOPN Inject 3 mg into the skin once a  week.  . [DISCONTINUED] HUMALOG KWIKPEN 100 UNIT/ML KiwkPen INJECT 22-28 UNITS 3 TIMES DAILY INTO THE SKIN.  . [DISCONTINUED] TOUJEO MAX SOLOSTAR 300 UNIT/ML SOPN INJECT 50-60 UNITS DAILY AT BEDTIME.   No facility-administered encounter medications on file as of 06/26/2019.    PHYSICAL EXAM:   General: NAD Pulmonary: speech is not forced Neurological: A&O x3.   Margaretha Sheffield, NP-C

## 2019-08-27 ENCOUNTER — Other Ambulatory Visit: Payer: Self-pay

## 2019-08-27 ENCOUNTER — Telehealth: Payer: Self-pay | Admitting: Internal Medicine

## 2019-08-27 MED ORDER — FREESTYLE LIBRE 14 DAY SENSOR MISC: 0 refills | Status: DC

## 2019-08-27 NOTE — Telephone Encounter (Signed)
Medication Refill Request  Did you call your pharmacy and request this refill first? Yes-PHARM stated they need a new RX . If patient has not contacted pharmacy first, instruct them to do so for future refills.  . Remind them that contacting the pharmacy for their refill is the quickest method to get the refill.  . Refill policy also stated that it will take anywhere between 24-72 hours to receive the refill.    Name of medication?  Continuous Blood Gluc Sensor (FREESTYLE LIBRE 14 DAY SENSOR) MISC  Is this a 90 day supply? Not sure  Name and location of pharmacy[facility] [address] [place], [place]  . Is the request for diabetes test strips? No . If yes, what brand? N/A

## 2019-08-27 NOTE — Telephone Encounter (Signed)
Rx sent 

## 2019-09-30 ENCOUNTER — Encounter (HOSPITAL_BASED_OUTPATIENT_CLINIC_OR_DEPARTMENT_OTHER): Payer: Medicare Other | Attending: Internal Medicine | Admitting: Internal Medicine

## 2019-09-30 DIAGNOSIS — I10 Essential (primary) hypertension: Secondary | ICD-10-CM | POA: Insufficient documentation

## 2019-09-30 DIAGNOSIS — E7800 Pure hypercholesterolemia, unspecified: Secondary | ICD-10-CM | POA: Diagnosis not present

## 2019-09-30 DIAGNOSIS — G8250 Quadriplegia, unspecified: Secondary | ICD-10-CM | POA: Diagnosis not present

## 2019-09-30 DIAGNOSIS — Z8249 Family history of ischemic heart disease and other diseases of the circulatory system: Secondary | ICD-10-CM | POA: Insufficient documentation

## 2019-09-30 DIAGNOSIS — G40909 Epilepsy, unspecified, not intractable, without status epilepticus: Secondary | ICD-10-CM | POA: Diagnosis not present

## 2019-09-30 DIAGNOSIS — L89213 Pressure ulcer of right hip, stage 3: Secondary | ICD-10-CM | POA: Insufficient documentation

## 2019-09-30 DIAGNOSIS — Z833 Family history of diabetes mellitus: Secondary | ICD-10-CM | POA: Diagnosis not present

## 2019-09-30 DIAGNOSIS — Z87891 Personal history of nicotine dependence: Secondary | ICD-10-CM | POA: Insufficient documentation

## 2019-09-30 DIAGNOSIS — E11622 Type 2 diabetes mellitus with other skin ulcer: Secondary | ICD-10-CM | POA: Insufficient documentation

## 2019-09-30 DIAGNOSIS — K219 Gastro-esophageal reflux disease without esophagitis: Secondary | ICD-10-CM | POA: Diagnosis not present

## 2019-09-30 DIAGNOSIS — E119 Type 2 diabetes mellitus without complications: Secondary | ICD-10-CM | POA: Diagnosis not present

## 2019-09-30 DIAGNOSIS — G129 Spinal muscular atrophy, unspecified: Secondary | ICD-10-CM | POA: Insufficient documentation

## 2019-09-30 DIAGNOSIS — Z515 Encounter for palliative care: Secondary | ICD-10-CM | POA: Diagnosis not present

## 2019-09-30 DIAGNOSIS — Z880 Allergy status to penicillin: Secondary | ICD-10-CM | POA: Diagnosis not present

## 2019-09-30 NOTE — Progress Notes (Signed)
[name] ([id number]) , Visit Report for 09/30/2019 Abuse/Suicide Risk Screen Details Patient Name: Date of Service: [name], [name] 09/30/2019 2:45 PM Medical Record Number: [id number] Patient Account Number: [id number] Date of Birth/Sex: Treating RN: [birth year] (54 y.o. [name] Primary Care Krisna Omar: Tally Joe Other Clinician: Referring Lind Ausley: Treating Markasia Carrol/Extender: Arvella Nigh in Treatment: 0 Abuse/Suicide Risk Screen Items Answer ABUSE RISK SCREEN: Has anyone close to you tried to hurt or harm you recentlyo No Do you feel uncomfortable with anyone in your familyo No Has anyone forced you do things that you didnt want to doo No Electronic Signature(s) Signed: 09/30/2019 4:57:23 PM By: Zenaida Deed RN, BSN Entered By: Zenaida Deed on 09/30/2019 15:34:49 -------------------------------------------------------------------------------- Activities of Daily Living Details Patient Name: Date of Service: [name] FFITT, PA UL A. 09/30/2019 2:45 PM Medical Record Number: [id number] Patient Account Number: [id number] Date of Birth/Sex: Treating RN: Feb 28, 1965 (54 y.o. [name] Primary Care Laci Frenkel: Tally Joe Other Clinician: Referring Bayden Gil: Treating Ladell Lea/Extender: Arvella Nigh in Treatment: 0 Activities of Daily Living Items Answer Activities of Daily Living (Please select one for each item) Drive Automobile Not Able T Medications ake Need Assistance Use T elephone Completely Able Care for Appearance Need Assistance Use T oilet Need Assistance Bath / Shower Not Able Dress Self Not Able Feed Self Not Able Walk Not Able Get In / Out Bed Need Assistance Housework Not Able Prepare Meals Not Able Handle Money Need Assistance Shop for Self Not Able Electronic Signature(s) Signed: 09/30/2019 4:57:23 PM By: Zenaida Deed RN, BSN Entered By: Zenaida Deed on 09/30/2019  15:36:27 -------------------------------------------------------------------------------- Education Screening Details Patient Name: Date of Service: [name] FFITT, PA UL A. 09/30/2019 2:45 PM Medical Record Number: [id number] Patient Account Number: [id number] Date of Birth/Sex: Treating RN: April 20, 1965 (54 y.o. [name] Primary Care Camp Gopal: Tally Joe Other Clinician: Referring Verner Kopischke: Treating Latrisa Hellums/Extender: Arvella Nigh in Treatment: 0 Primary Learner Assessed: Patient Learning Preferences/Education Level/Primary Language Learning Preference: Explanation, Demonstration, Printed Material Highest Education Level: High School Preferred Language: English Cognitive Barrier Language Barrier: No Translator Needed: No Memory Deficit: No Emotional Barrier: No Cultural/Religious Beliefs Affecting Medical Care: No Physical Barrier Impaired Vision: No Impaired Hearing: No Decreased Hand dexterity: Yes Limitations: slight movement in fingers Knowledge/Comprehension Knowledge Level: High Comprehension Level: High Ability to understand written instructions: High Ability to understand verbal instructions: High Motivation Anxiety Level: Calm Cooperation: Cooperative Education Importance: Acknowledges Need Interest in Health Problems: Asks Questions Perception: Coherent Willingness to Engage in Self-Management High Activities: Readiness to Engage in Self-Management High Activities: Electronic Signature(s) Signed: 09/30/2019 4:57:23 PM By: Zenaida Deed RN, BSN Entered By: Zenaida Deed on 09/30/2019 15:37:13 -------------------------------------------------------------------------------- Fall Risk Assessment Details Patient Name: Date of Service: [name] FFITT, PA UL A. 09/30/2019 2:45 PM Medical Record Number: [id number] Patient Account Number: [id number] Date of Birth/Sex: Treating RN: Jun 24, 1965 (54 y.o. [name] Primary Care  Niyla Marone: Tally Joe Other Clinician: Referring Mj Willis: Treating Jeri Rawlins/Extender: Arvella Nigh in Treatment: 0 Fall Risk Assessment Items Have you had 2 or more falls in the last 12 monthso 0 No Have you had any fall that resulted in injury in the last 12 monthso 0 No FALLS RISK SCREEN History of falling - immediate or within 3 months 0 No Secondary diagnosis (Do you have 2 or more medical diagnoseso) 0 No Ambulatory aid None/bed rest/wheelchair/nurse 0 Yes Crutches/cane/walker 0 No Furniture 0 No Intravenous therapy Access/Saline/Heparin Lock 0 No Gait/Transferring  Normal/ bed rest/ wheelchair 0 Yes Weak (short steps with or without shuffle, stooped but able to lift head while walking, may seek 0 No support from furniture) Impaired (short steps with shuffle, may have difficulty arising from chair, head down, impaired 0 No balance) Mental Status Oriented to own ability 0 Yes Electronic Signature(s) Signed: 09/30/2019 4:57:23 PM By: Zenaida Deed RN, BSN Entered By: Zenaida Deed on 09/30/2019 15:37:27 -------------------------------------------------------------------------------- Foot Assessment Details Patient Name: Date of Service: [name] FFITT, PA UL A. 09/30/2019 2:45 PM Medical Record Number: [id number] Patient Account Number: [id number] Date of Birth/Sex: Treating RN: 06/30/1965 (54 y.o. [name] Primary Care Malcome Ambrocio: Tally Joe Other Clinician: Referring Daxtin Leiker: Treating Matsuko Kretz/Extender: Arvella Nigh in Treatment: 0 Foot Assessment Items Site Locations + = Sensation present, - = Sensation absent, C = Callus, U = Ulcer R = Redness, W = Warmth, M = Maceration, PU = Pre-ulcerative lesion F = Fissure, S = Swelling, D = Dryness Assessment Right: Left: Other Deformity: No No Prior Foot Ulcer: No No Prior Amputation: No No Charcot Joint: No No Ambulatory Status: Non-ambulatory Assistance Device:  Wheelchair Gait: Electronic Signature(s) Signed: 09/30/2019 4:57:23 PM By: Zenaida Deed RN, BSN Entered By: Zenaida Deed on 09/30/2019 15:39:07 -------------------------------------------------------------------------------- Nutrition Risk Screening Details Patient Name: Date of Service: [name] FFITT, PA UL A. 09/30/2019 2:45 PM Medical Record Number: [id number] Patient Account Number: [id number] Date of Birth/Sex: Treating RN: [birth year] (54 y.o. [name] Primary Care Analis Distler: Tally Joe Other Clinician: Referring Rebeccah Ivins: Treating Cheila Wickstrom/Extender: Arvella Nigh in Treatment: 0 Height (in): 68 Weight (lbs): Body Mass Index (BMI): Nutrition Risk Screening Items Score Screening NUTRITION RISK SCREEN: I have an illness or condition that made me change the kind and/or amount of food I eat 2 Yes I eat fewer than two meals per day 3 Yes I eat few fruits and vegetables, or milk products 0 No I have three or more drinks of beer, liquor or wine almost every day 0 No I have tooth or mouth problems that make it hard for me to eat 2 Yes I don't always have enough money to buy the food I need 0 No I eat alone most of the time 0 No I take three or more different prescribed or over-the-counter drugs a day 1 Yes Without wanting to, I have lost or gained 10 pounds in the last six months 0 No I am not always physically able to shop, cook and/or feed myself 0 No Nutrition Protocols Good Risk Protocol Moderate Risk Protocol High Risk Proctocol 0 Provide education on nutrition Risk Level: High Risk Score: 8 Electronic Signature(s) Signed: 09/30/2019 4:57:23 PM By: Zenaida Deed RN, BSN Entered By: Zenaida Deed on 09/30/2019 15:38:52

## 2019-10-01 NOTE — Progress Notes (Signed)
[name] ([id number]) , Visit Report for 09/30/2019 Allergy List Details Patient Name: Date of Service: [name], [name] UL A. 09/30/2019 2:45 PM Medical Record Number: [id number] Patient Account Number: [id number] Date of Birth/Sex: Treating RN: [birth year] (54 y.o. [name] Primary Care Makayla Lanter: Tally Joe Other Clinician: Referring Lulani Bour: Treating Lindsey Hommel/Extender: Arvella Nigh in Treatment: 0 Allergies Active Allergies penicillin Reaction: hives Severity: Severe Allergy Notes Electronic Signature(s) Signed: 09/30/2019 4:57:23 PM By: Zenaida Deed RN, BSN Entered By: Zenaida Deed on 09/30/2019 15:18:49 -------------------------------------------------------------------------------- Arrival Information Details Patient Name: Date of Service: [name], PA UL A. 09/30/2019 2:45 PM Medical Record Number: [id number] Patient Account Number: [id number] Date of Birth/Sex: Treating RN: 1965-02-17 (54 y.o. [name] Primary Care Rashae Rother: Tally Joe Other Clinician: Referring Rayya Yagi: Treating Zenda Herskowitz/Extender: Arvella Nigh in Treatment: 0 Visit Information Patient Arrived: Wheel Chair Arrival Time: 15:02 Accompanied By: mother Transfer Assistance: Nurse, adult Patient Identification Verified: Yes Secondary Verification Process Completed: Yes Patient Requires Transmission-Based Precautions: No Patient Has Alerts: No Electronic Signature(s) Signed: 09/30/2019 4:57:23 PM By: Zenaida Deed RN, BSN Entered By: Zenaida Deed on 09/30/2019 15:03:14 -------------------------------------------------------------------------------- Clinic Level of Care Assessment Details Patient Name: Date of Service: [name], PA UL A. 09/30/2019 2:45 PM Medical Record Number: [id number] Patient Account Number: [id number] Date of Birth/Sex: Treating RN: October 27, 1965 (54 y.o. [name] Primary Care Onyx Schirmer: Tally Joe Other Clinician: Referring Kendalynn Wideman: Treating Kaaliyah Kita/Extender: Arvella Nigh in Treatment: 0 Clinic Level of Care Assessment Items TOOL 2 Quantity Score X- 1 0 Use when only an EandM is performed on the INITIAL visit ASSESSMENTS - Nursing Assessment / Reassessment X- 1 20 General Physical Exam (combine w/ comprehensive assessment (listed just below) when performed on new pt. evals) X- 1 25 Comprehensive Assessment (HX, ROS, Risk Assessments, Wounds Hx, etc.) ASSESSMENTS - Wound and Skin A ssessment / Reassessment X - Simple Wound Assessment / Reassessment - one wound 1 5 []  - 0 Complex Wound Assessment / Reassessment - multiple wounds []  - 0 Dermatologic / Skin Assessment (not related to wound area) ASSESSMENTS - Ostomy and/or Continence Assessment and Care []  - 0 Incontinence Assessment and Management []  - 0 Ostomy Care Assessment and Management (repouching, etc.) PROCESS - Coordination of Care X - Simple Patient / Family Education for ongoing care 1 15 []  - 0 Complex (extensive) Patient / Family Education for ongoing care X- 1 10 Staff obtains , Records, T Results / Process Orders est []  - 0 Staff telephones HHA, Nursing Homes / Clarify orders / etc []  - 0 Routine Transfer to another Facility (non-emergent condition) []  - 0 Routine Hospital Admission (non-emergent condition) X- 1 15 New Admissions / / Ordering NPWT Apligraf, etc. , []  - 0 Emergency Hospital Admission (emergent condition) X- 1 10 Simple Discharge Coordination []  - 0 Complex (extensive) Discharge Coordination PROCESS - Special Needs []  - 0 Pediatric / Minor Patient Management []  - 0 Isolation Patient Management []  - 0 Hearing / Language / Visual special needs []  - 0 Assessment of Community assistance (transportation, D/C planning, etc.) []  - 0 Additional assistance / Altered mentation []  - 0 Support Surface(s) Assessment (bed,  cushion, seat, etc.) INTERVENTIONS - Wound Cleansing / Measurement X- 1 5 Wound Imaging (photographs - any number of wounds) []  - 0 Wound Tracing (instead of photographs) X- 1 5 Simple Wound Measurement - one wound []  - 0 Complex Wound Measurement - multiple wounds X- 1 5 Simple  Wound Cleansing - one wound []  - 0 Complex Wound Cleansing - multiple wounds INTERVENTIONS - Wound Dressings []  - 0 Small Wound Dressing one or multiple wounds X- 1 15 Medium Wound Dressing one or multiple wounds []  - 0 Large Wound Dressing one or multiple wounds []  - 0 Application of Medications - injection INTERVENTIONS - Miscellaneous []  - 0 External ear exam []  - 0 Specimen Collection (cultures, biopsies, blood, body fluids, etc.) []  - 0 Specimen(s) / Culture(s) sent or taken to Lab for analysis []  - 0 Patient Transfer (multiple staff / Nurse, adultHoyer Lift / Similar devices) []  - 0 Simple Staple / Suture removal (25 or less) []  - 0 Complex Staple / Suture removal (26 or more) []  - 0 Hypo / Hyperglycemic Management (close monitor of Blood Glucose) X- 1 15 Ankle / Brachial Index (ABI) - do not check if billed separately Has the patient been seen at the hospital within the last three years: Yes Total Score: 145 Level Of Care: New/Established - Level 4 Electronic Signature(s) Signed: 10/01/2019 10:37:46 AM By: Yevonne PaxEpps, Carrie RN Entered By: Yevonne PaxEpps, Carrie on 09/30/2019 16:43:14 -------------------------------------------------------------------------------- Encounter Discharge Information Details Patient Name: Date of Service: [name], PA UL A. 09/30/2019 2:45 PM Medical Record Number: [id number] Patient Account Number: [id number] Date of Birth/Sex: Treating RN: Sep 21, 1965 (54 y.o. [name]) Dwiggins, Shannon Primary Care Lancer Thurner: Tally JoeSwayne, David Other Clinician: Referring Aruna Nestler: Treating Kaliope Quinonez/Extender: Arvella Nighobson, Michael Swayne, David Weeks in Treatment: 0 Encounter Discharge Information Items Discharge  Condition: Stable Ambulatory Status: Wheelchair Discharge Destination: Home Transportation: Other Accompanied By: self Schedule Follow-up Appointment: Yes Clinical Summary of Care: Patient Declined Electronic Signature(s) Signed: 09/30/2019 5:00:49 PM By: Cherylin Mylarwiggins, Shannon Entered By: Cherylin Mylarwiggins, Shannon on 09/30/2019 16:57:52 -------------------------------------------------------------------------------- Lower Extremity Assessment Details Patient Name: Date of Service: [name], PA UL A. 09/30/2019 2:45 PM Medical Record Number: [id number] Patient Account Number: [id number] Date of Birth/Sex: Treating RN: Sep 21, 1965 (54 y.o. [name]) Boehlein, Linda Primary Care Linden Mikes: Tally JoeSwayne, David Other Clinician: Referring Jahleah Mariscal: Treating Carmie Lanpher/Extender: Arvella Nighobson, Michael Swayne, David Weeks in Treatment: 0 Electronic Signature(s) Signed: 09/30/2019 4:57:23 PM By: Zenaida DeedBoehlein, Linda RN, BSN Entered By: Zenaida DeedBoehlein, Linda on 09/30/2019 15:39:16 -------------------------------------------------------------------------------- Multi Wound Chart Details Patient Name: Date of Service: [name], PA UL A. 09/30/2019 2:45 PM Medical Record Number: [id number] Patient Account Number: [id number] Date of Birth/Sex: Treating RN: Sep 21, 1965 (54 y.o. [name] [place]) Epps, Carrie Primary Care Venda Dice: Tally JoeSwayne, David Other Clinician: Referring Jariel Drost: Treating Tasman Zapata/Extender: Arvella Nighobson, Michael Swayne, David Weeks in Treatment: 0 Vital Signs Height(in): 68 Capillary Blood Glucose(mg/dl): 295125 Weight(lbs): Pulse(bpm): 96 Body Mass Index(BMI): Blood Pressure(mmHg): 144/82 Temperature(F): 98.5 Respiratory Rate(breaths/min): 18 Photos: [1:No Photos Right Trochanter] [N/A:N/A N/A] Wound Location: [1:Pressure Injury] [N/A:N/A] Wounding Event: [1:Pressure Ulcer] [N/A:N/A] Primary Etiology: [1:Hypertension, Type II Diabetes,] [N/A:N/A] Comorbid History: [1:Paraplegia, Seizure Disorder, Confinement Anxiety 07/31/2019]  [N/A:N/A] Date Acquired: [1:0] [N/A:N/A] Weeks of Treatment: [1:Open] [N/A:N/A] Wound Status: [1:0.8x0.8x0.1] [N/A:N/A] Measurements L x W x D (cm) [1:0.503] [N/A:N/A] A (cm) : rea [1:0.05] [N/A:N/A] Volume (cm) : [1:Category/Stage III] [N/A:N/A] Classification: [1:Small] [N/A:N/A] Exudate A mount: [1:Serosanguineous] [N/A:N/A] Exudate Type: [1:red, brown] [N/A:N/A] Exudate Color: [1:Flat and Intact] [N/A:N/A] Wound Margin: [1:Medium (34-66%)] [N/A:N/A] Granulation A mount: [1:Pink] [N/A:N/A] Granulation Quality: [1:Medium (34-66%)] [N/A:N/A] Necrotic A mount: [1:Fat Layer (Subcutaneous Tissue): Yes N/A] Exposed Structures: [1:Fascia: No Tendon: No Muscle: No Joint: No Bone: No Small (1-33%)] [N/A:N/A] Treatment Notes Electronic Signature(s) Signed: 09/30/2019 5:08:08 PM By: Baltazar Najjarobson, Michael MD Signed: 10/01/2019 10:37:46 AM By: Yevonne PaxEpps, Carrie RN Entered By: Baltazar Najjarobson, Michael on 09/30/2019 16:51:40 -------------------------------------------------------------------------------- Multi-Disciplinary  Care Plan Details Patient Name: Date of Service: [name], [name] UL A. 09/30/2019 2:45 PM Medical Record Number: [id number] Patient Account Number: [id number] Date of Birth/Sex: Treating RN: Feb 22, 1965 (54 y.o. Judie Petit) [name] Primary Care Ahmya Bernick: Tally Joe Other Clinician: Referring Evann Erazo: Treating Colie Fugitt/Extender: Arvella Nigh in Treatment: 0 Active Inactive Wound/Skin Impairment Nursing Diagnoses: Knowledge deficit related to ulceration/compromised skin integrity Goals: Patient/caregiver will verbalize understanding of skin care regimen Date Initiated: 09/30/2019 Target Resolution Date: 10/30/2019 Goal Status: Active Ulcer/skin breakdown will have a volume reduction of 30% by week 4 Date Initiated: 09/30/2019 Target Resolution Date: 10/30/2019 Goal Status: Active Interventions: Assess patient/caregiver ability to obtain necessary supplies Assess  patient/caregiver ability to perform ulcer/skin care regimen upon admission and as needed Assess ulceration(s) every visit Notes: Electronic Signature(s) Signed: 10/01/2019 10:37:46 AM By: [name] RN Entered By: [name] on 09/30/2019 16:27:53 -------------------------------------------------------------------------------- Pain Assessment Details Patient Name: Date of Service: [name], PA UL A. 09/30/2019 2:45 PM Medical Record Number: [id number] Patient Account Number: [id number] Date of Birth/Sex: Treating RN: 09-22-1965 (54 y.o. [name] Primary Care Adeyemi Hamad: Tally Joe Other Clinician: Referring Osher Oettinger: Treating Izabel Chim/Extender: Arvella Nigh in Treatment: 0 Active Problems Location of Pain Severity and Description of Pain Patient Has Paino Yes Site Locations Pain Location: Pain Location: Pain in Ulcers With Dressing Change: No Duration of the Pain. Constant / Intermittento Intermittent Rate the pain. Current Pain Level: 0 Worst Pain Level: 10 Least Pain Level: 0 Character of Pain Describe the Pain: Aching, Tender Pain Management and Medication Current Pain Management: Other: reposition Is the Current Pain Management Adequate: Adequate How does your wound impact your activities of daily livingo Sleep: Yes Bathing: No Appetite: No Relationship With Others: No Bladder Continence: No Emotions: No Bowel Continence: No Hobbies: No Toileting: No Dressing: No Electronic Signature(s) Signed: 09/30/2019 4:57:23 PM By: Zenaida Deed RN, BSN Entered By: Zenaida Deed on 09/30/2019 15:49:03 -------------------------------------------------------------------------------- Patient/Caregiver Education Details Patient Name: Date of Service: [name], PA UL A. 8/31/2021andnbsp2:45 PM Medical Record Number: [id number] Patient Account Number: [id number] Date of Birth/Gender: Treating RN: 21-Jun-1965 (54 y.o. [name] Primary Care Physician: Tally Joe Other Clinician: Referring Physician: Treating Physician/Extender: Arvella Nigh in Treatment: 0 Education Assessment Education Provided To: Patient Education Topics Provided Wound/Skin Impairment: Methods: Explain/Verbal Responses: State content correctly Electronic Signature(s) Signed: 10/01/2019 10:37:46 AM By: [name] RN Entered By: [name] on 09/30/2019 16:28:02 -------------------------------------------------------------------------------- Wound Assessment Details Patient Name: Date of Service: [name], PA UL A. 09/30/2019 2:45 PM Medical Record Number: [id number] Patient Account Number: [id number] Date of Birth/Sex: Treating RN: 12-Apr-1965 (54 y.o. [name] Primary Care Sofiah Lyne: Tally Joe Other Clinician: Referring Caylor Cerino: Treating Georga Stys/Extender: Arvella Nigh in Treatment: 0 Wound Status Wound Number: 1 Primary Pressure Ulcer Etiology: Wound Location: Right Trochanter Wound Open Wounding Event: Pressure Injury Status: Date Acquired: 07/31/2019 Comorbid Hypertension, Type II Diabetes, Paraplegia, Seizure Disorder, Weeks Of Treatment: 0 History: Confinement Anxiety Clustered Wound: No Wound Measurements Length: (cm) 0.8 Width: (cm) 0.8 Depth: (cm) 0.1 Area: (cm) 0.503 Volume: (cm) 0.05 % Reduction in Area: % Reduction in Volume: Epithelialization: Small (1-33%) Tunneling: No Undermining: No Wound Description Classification: Category/Stage III Wound Margin: Flat and Intact Exudate Amount: Small Exudate Type: Serosanguineous Exudate Color: red, brown Foul Odor After Cleansing: No Slough/Fibrino Yes Wound Bed Granulation Amount: Medium (34-66%) Exposed Structure Granulation Quality: Pink Fascia Exposed: No Necrotic Amount: Medium (34-66%) Fat Layer (Subcutaneous Tissue) Exposed:  Yes Necrotic Quality: Adherent Slough Tendon  Exposed: No Muscle Exposed: No Joint Exposed: No Bone Exposed: No Treatment Notes Wound #1 (Right Trochanter) 1. Cleanse With Wound Cleanser 3. Primary Dressing Applied Hydrofera Blue 4. Secondary Dressing Foam Border Dressing Electronic Signature(s) Signed: 09/30/2019 4:57:23 PM By: Zenaida Deed RN, BSN Entered By: Zenaida Deed on 09/30/2019 15:47:57 -------------------------------------------------------------------------------- Vitals Details Patient Name: Date of Service: [name], PA UL A. 09/30/2019 2:45 PM Medical Record Number: [id number] Patient Account Number: [id number] Date of Birth/Sex: Treating RN: 1965/12/03 (54 y.o. [name] Primary Care Dejan Angert: Other Clinician: Tally Joe Referring Clara Herbison: Treating Haygen Zebrowski/Extender: Arvella Nigh in Treatment: 0 Vital Signs Time Taken: 15:15 Temperature (F): 98.5 Height (in): 68 Pulse (bpm): 96 Source: Stated Respiratory Rate (breaths/min): 18 Blood Pressure (mmHg): 144/82 Capillary Blood Glucose (mg/dl): 170 Reference Range: 80 - 120 mg / dl Notes glucose per pt report this am Electronic Signature(s) Signed: 09/30/2019 4:57:23 PM By: Zenaida Deed RN, BSN Entered By: Zenaida Deed on 09/30/2019 15:18:13

## 2019-10-01 NOTE — Progress Notes (Signed)
[name] ([id number]) , Visit Report for 09/30/2019 Chief Complaint Document Details Patient Name: Date of Service: [name], [name] 09/30/2019 2:45 PM Medical Record Number: [id number] Patient Account Number: [id number] Date of Birth/Sex: Treating RN: [birth year] (54 y.o. [name]) [name] Primary Care Provider: Tally Best Other Clinician: Referring Provider: Treating Provider/Extender: [name] in Treatment: 0 Information Obtained from: Patient Chief Complaint 09/30/2019; patient is here for review of wound on the right greater trochanter Electronic Signature(s) Signed: 09/30/2019 5:08:08 PM By: [name] Najjar MD Entered By: [name] on 09/30/2019 16:52:50 -------------------------------------------------------------------------------- HPI Details Patient Name: Date of Service: [name] FFITT, PA UL A. 09/30/2019 2:45 PM Medical Record Number: [id number] Patient Account Number: [id number] Date of Birth/Sex: Treating RN: 1965/05/11 (54 y.o. [name] Primary Care Provider: Tally Best Other Clinician: Referring Provider: Treating Provider/Extender: [name] in Treatment: 0 History of Present Illness HPI Description: ADMISSION 09/30/2019 [name] is an unfortunate 54 year old man that we have cared for previously mostly in our clinic in [place]. In fact I saw him in January 2020. We have always been dealing with a problem in the right greater trochanter. He has advanced spinal muscular atrophy. When we first saw him in 2017 I think the area was actually a larger wound which by to their description required debridement by Dr. Meyer Russel. I last saw him last in 2020 everything was closed. We recommended border foam or equivalent to cover this area and help with friction relief etc. They have been doing this religiously. Apparently about 2 months ago they noticed drainage under the border foam and they developed a  small open area here. They are here for our review of this. Past medical history; spinal muscular atrophy which is advanced. He is functionally quadriparetic. He can talk. He is total care being cared for at home I think now by his elderly parents. He was diagnosed at age 69. He apparently has respiratory secretion issues. He therefore has to lie on his right side apparently if he is on his left secretions increase. Type 2 diabetes, he has some form of mattress overlay. He also has palliative care Electronic Signature(s) Signed: 09/30/2019 5:08:08 PM By: [name] Najjar MD Entered By: [name] on 09/30/2019 16:56:26 -------------------------------------------------------------------------------- Physical Exam Details Patient Name: Date of Service: [name] FFITT, PA UL A. 09/30/2019 2:45 PM Medical Record Number: [id number] Patient Account Number: [id number] Date of Birth/Sex: Treating RN: 08/10/1965 (54 y.o. [name] Primary Care Provider: Tally Best Other Clinician: Referring Provider: Treating Provider/Extender: [name] in Treatment: 0 Constitutional Patient is hypertensive.. Pulse regular and within target range for patient.Marland Kitchen Respirations regular, non-labored and within target range.. Temperature is normal and within the target range for the patient.Marland Kitchen Appears in no distress. Respiratory work of breathing is normal. Psychiatric appears at normal baseline. Notes Wound exam; right greater trochanter. Very small wound. Under illumination some debris on the surface although it looks as though there is attempts to epithelialize this. There is erythema around the wound which under close inspection I think is scar tissue rather than any infection. Electronic Signature(s) Signed: 09/30/2019 5:08:08 PM By: [name] Najjar MD Entered By: [name] on 09/30/2019  16:59:07 -------------------------------------------------------------------------------- Physician Orders Details Patient Name: Date of Service: [name] FFITT, PA UL A. 09/30/2019 2:45 PM Medical Record Number: [id number] Patient Account Number: [id number] Date of Birth/Sex: Treating RN: Dec 19, 1965 (54 y.o. [name] Primary Care Provider: Tally Best Other Clinician: Referring Provider: Treating Provider/Extender:  [name] in Treatment: 0 Verbal / Phone Orders: No Diagnosis Coding Follow-up Appointments Return Appointment in 2 weeks. Dressing Change Frequency Change Dressing every other day. Wound Cleansing Clean wound with Normal Saline. Primary Wound Dressing Wound #1 Right Trochanter Hydrofera Blue - ready Secondary Dressing Foam Border Electronic Signature(s) Signed: 09/30/2019 5:08:08 PM By: [name] Najjar MD Signed: 10/01/2019 10:37:46 AM By: [name] Pax RN Entered By: [name] on 09/30/2019 16:30:07 -------------------------------------------------------------------------------- Problem List Details Patient Name: Date of Service: [name] FFITT, PA UL A. 09/30/2019 2:45 PM Medical Record Number: [id number] Patient Account Number: [id number] Date of Birth/Sex: Treating RN: 1965/05/03 (54 y.o. [name] Primary Care Provider: Tally Best Other Clinician: Referring Provider: Treating Provider/Extender: [name] in Treatment: 0 Active Problems ICD-10 Encounter Code Description Active Date MDM Diagnosis [phone] Pressure ulcer of right hip, stage 3 09/30/2019 No Yes G12.9 Spinal muscular atrophy, unspecified 09/30/2019 No Yes Inactive Problems Resolved Problems Electronic Signature(s) Signed: 09/30/2019 5:08:08 PM By: [name] Najjar MD Entered By: [name] on 09/30/2019 16:51:34 -------------------------------------------------------------------------------- Progress Note Details Patient Name: Date  of Service: [name] FFITT, PA UL A. 09/30/2019 2:45 PM Medical Record Number: [id number] Patient Account Number: [id number] Date of Birth/Sex: Treating RN: May 15, 1965 (54 y.o. [name] Primary Care Provider: Tally Best Other Clinician: Referring Provider: Treating Provider/Extender: [name] in Treatment: 0 Subjective Chief Complaint Information obtained from Patient 09/30/2019; patient is here for review of wound on the right greater trochanter History of Present Illness (HPI) ADMISSION 09/30/2019 [name] is an unfortunate 54 year old man that we have cared for previously mostly in our clinic in [place]. In fact I saw him in January 2020. We have always been dealing with a problem in the right greater trochanter. He has advanced spinal muscular atrophy. When we first saw him in 2017 I think the area was actually a larger wound which by to their description required debridement by Dr. Meyer Russel. I last saw him last in 2020 everything was closed. We recommended border foam or equivalent to cover this area and help with friction relief etc. They have been doing this religiously. Apparently about 2 months ago they noticed drainage under the border foam and they developed a small open area here. They are here for our review of this. Past medical history; spinal muscular atrophy which is advanced. He is functionally quadriparetic. He can talk. He is total care being cared for at home I think now by his elderly parents. He was diagnosed at age 55. He apparently has respiratory secretion issues. He therefore has to lie on his right side apparently if he is on his left secretions increase. Type 2 diabetes, he has some form of mattress overlay. He also has palliative care Patient History Information obtained from Patient. Allergies penicillin (Severity: Severe, Reaction: hives) Family History Cancer - Siblings, Diabetes - Father,Mother, Heart Disease -  Father,Mother, Hypertension - Mother,Father, Lung Disease - Paternal Grandparents, Thyroid Problems - Mother, No family history of Hereditary Spherocytosis, Kidney Disease, Seizures, Stroke, Tuberculosis. Social History Former smoker - quit 1980s, Marital Status - Single, Alcohol Use - Never, Drug Use - No History, Caffeine Use - Daily - soda. Medical History Cardiovascular Patient has history of Hypertension Endocrine Patient has history of Type II Diabetes Denies history of Type I Diabetes Genitourinary Denies history of End Stage Renal Disease Integumentary (Skin) Denies history of History of Burn Neurologic Patient has history of Paraplegia, Seizure Disorder - hx Oncologic Denies history of  Received Chemotherapy, Received Radiation Psychiatric Patient has history of Confinement Anxiety Denies history of Anorexia/bulimia Patient is treated with Insulin. Blood sugar is tested. Hospitalization/Surgery History - muscle biopsy. - hemrrhoidectomy. Medical A Surgical History Notes nd Ear/Nose/Mouth/Throat dysphagia Cardiovascular hypercholesteremia Gastrointestinal GERD, hemorrhoids Genitourinary incontinence Musculoskeletal muscular dystrophy Review of Systems (ROS) Constitutional Symptoms (General Health) Denies complaints or symptoms of Fatigue, Fever, Chills, Marked Weight Change. Eyes Denies complaints or symptoms of Dry Eyes, Vision Changes, Glasses / Contacts. Ear/Nose/Mouth/Throat Denies complaints or symptoms of Chronic sinus problems or rhinitis. Respiratory Complains or has symptoms of Chronic or frequent coughs. Denies complaints or symptoms of Shortness of Breath. Cardiovascular Denies complaints or symptoms of Chest pain. Gastrointestinal Denies complaints or symptoms of Frequent diarrhea, Nausea, Vomiting. Endocrine Complains or has symptoms of Heat/cold intolerance - cold intolerance. Genitourinary Denies complaints or symptoms of Frequent  urination. Integumentary (Skin) Complains or has symptoms of Wounds - right hip. Musculoskeletal Denies complaints or symptoms of Muscle Pain, Muscle Weakness. Neurologic Complains or has symptoms of Numbness/parasthesias. Psychiatric Complains or has symptoms of Claustrophobia. Denies complaints or symptoms of Suicidal. Objective Constitutional Patient is hypertensive.. Pulse regular and within target range for patient.Marland Kitchen. Respirations regular, non-labored and within target range.. Temperature is normal and within the target range for the patient.Marland Kitchen. Appears in no distress. Vitals Time Taken: 3:15 PM, Height: 68 in, Source: Stated, Temperature: 98.5 F, Pulse: 96 bpm, Respiratory Rate: 18 breaths/min, Blood Pressure: 144/82 mmHg, Capillary Blood Glucose: 125 mg/dl. General Notes: glucose per pt report this am Respiratory work of breathing is normal. Psychiatric appears at normal baseline. General Notes: Wound exam; right greater trochanter. Very small wound. Under illumination some debris on the surface although it looks as though there is attempts to epithelialize this. There is erythema around the wound which under close inspection I think is scar tissue rather than any infection. Integumentary (Hair, Skin) Wound #1 status is Open. Original cause of wound was Pressure Injury. The wound is located on the Right Trochanter. The wound measures 0.8cm length x 0.8cm width x 0.1cm depth; 0.503cm^2 area and 0.05cm^3 volume. There is Fat Layer (Subcutaneous Tissue) exposed. There is no tunneling or undermining noted. There is a small amount of serosanguineous drainage noted. The wound margin is flat and intact. There is medium (34-66%) pink granulation within the wound bed. There is a medium (34-66%) amount of necrotic tissue within the wound bed including Adherent Slough. Assessment Active Problems ICD-10 Pressure ulcer of right hip, stage 3 Spinal muscular atrophy,  unspecified Plan Follow-up Appointments: Return Appointment in 2 weeks. Dressing Change Frequency: Change Dressing every other day. Wound Cleansing: Clean wound with Normal Saline. Primary Wound Dressing: Wound #1 Right Trochanter: Hydrofera Blue - ready Secondary Dressing: Foam Border 1. Tiny wound with a questionable surface under illumination. I am going to use Hydrofera Blue change every 2 D under border foam. If this is not any better next time I will debride the surface I was trying to avoid this. 2. No evidence of infection 3. Even with a specialty mattress lying on the right side all night will likely not provide adequate pressure relief in this area which already has scar tissue. 4. [facility] I think the deeper wound was initially in 2017 when he was cared for by Dr. Meyer RusselBritto. He has had superficial wounds since then and he has been seen in the clinic 2 times but not recently 5. I do not feel there is any need for imaging here. He  does not appear to be malnourished. He uses a condom catheter for urinary diversion both during the day and at night so I do not think there is an incontinence issue. I spent 30 minutes in review of this patient's past medical record, face-to-face evaluation and preparation of this record Electronic Signature(s) Signed: 09/30/2019 5:08:08 PM By: [name] Najjar MD Entered By: [name] on 09/30/2019 17:01:40 -------------------------------------------------------------------------------- HxROS Details Patient Name: Date of Service: [name] FFITT, PA UL A. 09/30/2019 2:45 PM Medical Record Number: [id number] Patient Account Number: [id number] Date of Birth/Sex: Treating RN: 1965-02-28 (54 y.o. Damaris Schooner Primary Care Provider: Tally Best Other Clinician: Referring Provider: Treating Provider/Extender: [name] in Treatment: 0 Information Obtained From Patient Constitutional Symptoms  (General Health) Complaints and Symptoms: Negative for: Fatigue; Fever; Chills; Marked Weight Change Eyes Complaints and Symptoms: Negative for: Dry Eyes; Vision Changes; Glasses / Contacts Ear/Nose/Mouth/Throat Complaints and Symptoms: Negative for: Chronic sinus problems or rhinitis Medical History: Past Medical History Notes: dysphagia Respiratory Complaints and Symptoms: Positive for: Chronic or frequent coughs Negative for: Shortness of Breath Cardiovascular Complaints and Symptoms: Negative for: Chest pain Medical History: Positive for: Hypertension Past Medical History Notes: hypercholesteremia Gastrointestinal Complaints and Symptoms: Negative for: Frequent diarrhea; Nausea; Vomiting Medical History: Past Medical History Notes: GERD, hemorrhoids Endocrine Complaints and Symptoms: Positive for: Heat/cold intolerance - cold intolerance Medical History: Positive for: Type II Diabetes Negative for: Type I Diabetes Time with diabetes: since 2004 Treated with: Insulin Blood sugar tested every day: Yes Tested : 3-4 times per day Genitourinary Complaints and Symptoms: Negative for: Frequent urination Medical History: Negative for: End Stage Renal Disease Past Medical History Notes: incontinence Integumentary (Skin) Complaints and Symptoms: Positive for: Wounds - right hip Medical History: Negative for: History of Burn Musculoskeletal Complaints and Symptoms: Negative for: Muscle Pain; Muscle Weakness Medical History: Past Medical History Notes: muscular dystrophy Neurologic Complaints and Symptoms: Positive for: Numbness/parasthesias Medical History: Positive for: Paraplegia; Seizure Disorder - hx Psychiatric Complaints and Symptoms: Positive for: Claustrophobia Negative for: Suicidal Medical History: Positive for: Confinement Anxiety Negative for: Anorexia/bulimia Hematologic/Lymphatic Immunological Oncologic Medical History: Negative for:  Received Chemotherapy; Received Radiation Immunizations Pneumococcal Vaccine: Received Pneumococcal Vaccination: No Implantable Devices None Hospitalization / Surgery History Type of Hospitalization/Surgery muscle biopsy hemrrhoidectomy Family and Social History Cancer: Yes - Siblings; Diabetes: Yes - Father,Mother; Heart Disease: Yes - Father,Mother; Hereditary Spherocytosis: No; Hypertension: Yes - Mother,Father; Kidney Disease: No; Lung Disease: Yes - Paternal Grandparents; Seizures: No; Stroke: No; Thyroid Problems: Yes - Mother; Tuberculosis: No; Former smoker - quit 1980s; Marital Status - Single; Alcohol Use: Never; Drug Use: No History; Caffeine Use: Daily - soda; Financial Concerns: No; Food, Clothing or Shelter Needs: No; Support System Lacking: No; Transportation Concerns: No Psychologist, prison and probation services) Signed: 09/30/2019 4:57:23 PM By: Zenaida Deed RN, BSN Signed: 09/30/2019 5:08:08 PM By: [name] Najjar MD Entered By: Zenaida Deed on 09/30/2019 15:34:19 -------------------------------------------------------------------------------- SuperBill Details Patient Name: Date of Service: [name] FFITT, PA UL A. 09/30/2019 Medical Record Number: [id number] Patient Account Number: [id number] Date of Birth/Sex: Treating RN: [birth year] (54 y.o. [name] Primary Care Provider: Tally Best Other Clinician: Referring Provider: Treating Provider/Extender: [name] in Treatment: 0 Diagnosis Coding ICD-10 Codes Code Description [phone] Pressure ulcer of right hip, stage 3 G12.9 Spinal muscular atrophy, unspecified Facility Procedures CPT4 Code: [id number] Description: 99214 - WOUND CARE VISIT-LEV 4 EST PT Modifier: Quantity: 1 Physician Procedures : CPT4 Code Description Modifier [id number] - WC PHYS LEVEL 4 -  EST PT ICD-10 Diagnosis Description L89.213 Pressure ulcer of right hip, stage 3 G12.9 Spinal muscular atrophy, unspecified Quantity:  1 Electronic Signature(s) Signed: 10/01/2019 10:37:46 AM By: [name] Pax RN Signed: 10/01/2019 11:48:19 AM By: [name] Najjar MD Previous Signature: 09/30/2019 5:08:08 PM Version By: [name] Najjar MD Entered By: [name] on 09/30/2019 17:20:55

## 2019-10-10 ENCOUNTER — Other Ambulatory Visit: Payer: Self-pay | Admitting: Internal Medicine

## 2019-10-14 ENCOUNTER — Encounter (HOSPITAL_BASED_OUTPATIENT_CLINIC_OR_DEPARTMENT_OTHER): Payer: Medicare Other | Attending: Internal Medicine | Admitting: Internal Medicine

## 2019-10-14 DIAGNOSIS — Z09 Encounter for follow-up examination after completed treatment for conditions other than malignant neoplasm: Secondary | ICD-10-CM | POA: Diagnosis not present

## 2019-10-14 DIAGNOSIS — Z872 Personal history of diseases of the skin and subcutaneous tissue: Secondary | ICD-10-CM | POA: Diagnosis not present

## 2019-10-14 DIAGNOSIS — G129 Spinal muscular atrophy, unspecified: Secondary | ICD-10-CM | POA: Diagnosis not present

## 2019-10-20 NOTE — Progress Notes (Signed)
Torelli KYZEN HORN ([id number]) , Visit Report for 10/14/2019 Arrival Information Details Patient Name: Date of Service: [name], [name] 10/14/2019 2:45 PM Medical Record Number: [id number] Patient Account Number: [id number] Date of Birth/Sex: Treating RN: 10-Feb-1965 (54 y.o. Judie Petit) [name] Primary Care Kelse Ploch: Tally Joe Other Clinician: Referring Destinae Neubecker: Treating Mackenzye Mackel/Extender: Arvella Nigh in Treatment: 2 Visit Information History Since Last Visit Added or deleted any medications: No Patient Arrived: Wheel Chair Any new allergies or adverse reactions: No Arrival Time: 15:12 Had a fall or experienced change in No Accompanied By: mother activities of daily living that may affect Transfer Assistance: Michiel Sites Lift risk of falls: Patient Identification Verified: Yes Signs or symptoms of abuse/neglect since last visito No Secondary Verification Process Completed: Yes Hospitalized since last visit: No Patient Requires Transmission-Based Precautions: No Implantable device outside of the clinic excluding No Patient Has Alerts: No cellular tissue based products placed in the center since last visit: Has Dressing in Place as Prescribed: Yes Pain Present Now: No Electronic Signature(s) Signed: 10/15/2019 8:07:26 AM By: Karl Ito Entered By: Karl Ito on 10/14/2019 15:12:34 -------------------------------------------------------------------------------- Clinic Level of Care Assessment Details Patient Name: Date of Service: [name], [place] UL A. 10/14/2019 2:45 PM Medical Record Number: [id number] Patient Account Number: [id number] Date of Birth/Sex: Treating RN: 03/31/1965 (54 y.o. Judie Petit) [name] Primary Care Margart Zemanek: Tally Joe Other Clinician: Referring Ritamarie Arkin: Treating Ronald Vinsant/Extender: Arvella Nigh in Treatment: 2 Clinic Level of Care Assessment Items TOOL 4 Quantity Score X- 1 0 Use when only an EandM is  performed on FOLLOW-UP visit ASSESSMENTS - Nursing Assessment / Reassessment X- 1 10 Reassessment of Co-morbidities (includes updates in patient status) X- 1 5 Reassessment of Adherence to Treatment Plan ASSESSMENTS - Wound and Skin A ssessment / Reassessment X - Simple Wound Assessment / Reassessment - one wound 1 5 []  - 0 Complex Wound Assessment / Reassessment - multiple wounds []  - 0 Dermatologic / Skin Assessment (not related to wound area) ASSESSMENTS - Focused Assessment []  - 0 Circumferential Edema Measurements - multi extremities []  - 0 Nutritional Assessment / Counseling / Intervention []  - 0 Lower Extremity Assessment (monofilament, tuning fork, pulses) []  - 0 Peripheral Arterial Disease Assessment (using hand held doppler) ASSESSMENTS - Ostomy and/or Continence Assessment and Care []  - 0 Incontinence Assessment and Management []  - 0 Ostomy Care Assessment and Management (repouching, etc.) PROCESS - Coordination of Care X - Simple Patient / Family Education for ongoing care 1 15 []  - 0 Complex (extensive) Patient / Family Education for ongoing care X- 1 10 Staff obtains , Records, T Results / Process Orders est []  - 0 Staff telephones HHA, Nursing Homes / Clarify orders / etc []  - 0 Routine Transfer to another Facility (non-emergent condition) []  - 0 Routine Hospital Admission (non-emergent condition) []  - 0 New Admissions / / Ordering NPWT Apligraf, etc. , []  - 0 Emergency Hospital Admission (emergent condition) X- 1 10 Simple Discharge Coordination []  - 0 Complex (extensive) Discharge Coordination PROCESS - Special Needs []  - 0 Pediatric / Minor Patient Management []  - 0 Isolation Patient Management []  - 0 Hearing / Language / Visual special needs []  - 0 Assessment of Community assistance (transportation, D/C planning, etc.) []  - 0 Additional assistance / Altered mentation []  - 0 Support Surface(s) Assessment  (bed, cushion, seat, etc.) INTERVENTIONS - Wound Cleansing / Measurement X - Simple Wound Cleansing - one wound 1 5 []  - 0 Complex Wound Cleansing -  multiple wounds X- 1 5 Wound Imaging (photographs - any number of wounds) []  - 0 Wound Tracing (instead of photographs) X- 1 5 Simple Wound Measurement - one wound []  - 0 Complex Wound Measurement - multiple wounds INTERVENTIONS - Wound Dressings []  - 0 Small Wound Dressing one or multiple wounds []  - 0 Medium Wound Dressing one or multiple wounds []  - 0 Large Wound Dressing one or multiple wounds X- 1 5 Application of Medications - topical []  - 0 Application of Medications - injection INTERVENTIONS - Miscellaneous []  - 0 External ear exam []  - 0 Specimen Collection (cultures, biopsies, blood, body fluids, etc.) []  - 0 Specimen(s) / Culture(s) sent or taken to Lab for analysis []  - 0 Patient Transfer (multiple staff / / Similar devices) []  - 0 Simple Staple / Suture removal (25 or less) []  - 0 Complex Staple / Suture removal (26 or more) []  - 0 Hypo / Hyperglycemic Management (close monitor of Blood Glucose) []  - 0 Ankle / Brachial Index (ABI) - do not check if billed separately X- 1 5 Vital Signs Has the patient been seen at the hospital within the last three years: Yes Total Score: 80 Level Of Care: New/Established - Level 3 Electronic Signature(s) Signed: 10/20/2019 1:15:48 PM By: RN Entered By: on 10/14/2019 15:45:03 -------------------------------------------------------------------------------- Encounter Discharge Information Details Patient Name: Date of Service: [name], [place] UL A. 10/14/2019 2:45 PM Medical Record Number: Patient Account Number: Date of Birth/Sex: Treating RN: 08-11-1965 (54 y.o. Nurse, adult Primary Care Khoury Siemon: Other Clinician: Referring Alyviah Crandle: Treating Latriece Anstine/Extender: in Treatment: 2 Encounter Discharge Information Items Discharge Condition: Stable Ambulatory Status: Wheelchair Discharge Destination: Home Transportation: Other Accompanied By: mother Schedule Follow-up Appointment: No Clinical Summary of Care: Electronic Signature(s) Signed: 10/16/2019 4:34:39 PM By: Entered By: 10/22/2019 on 10/14/2019 16:03:59 -------------------------------------------------------------------------------- Lower Extremity Assessment Details Patient Name: Date of Service: [name], PA UL A. 10/14/2019 2:45 PM Medical Record Number: 10/16/2019 Patient Account Number: 10/16/2019 Date of Birth/Sex: Treating RN: July 22, 1965 (54 y.o. 07/01/1965 Primary Care Maame Dack: 41 Other Clinician: Referring Ambar Raphael: Treating Wilmer Berryhill/Extender: Katherina Right in Treatment: 2 Electronic Signature(s) Signed: 10/16/2019 4:34:39 PM By: Arvella Nigh Entered By: 10/18/2019 on 10/14/2019 15:21:38 -------------------------------------------------------------------------------- Multi Wound Chart Details Patient Name: Date of Service: [name], PA UL A. 10/14/2019 2:45 PM Medical Record Number: 10/16/2019 Patient Account Number: 10/16/2019 Date of Birth/Sex: Treating RN: Jun 01, 1965 (54 y.o. 07/01/1965 Primary Care Hennie Gosa: 41 Other Clinician: Referring Kaede Clendenen: Treating Colburn Asper/Extender: Katherina Right in Treatment: 2 Vital Signs Height(in): 68 Capillary Blood Glucose(mg/dl): Tally Joe Weight(lbs): Pulse(bpm): 105 Body Mass Index(BMI): Blood Pressure(mmHg): 154/87 Temperature(F): 98.4 Respiratory Rate(breaths/min): 18 Photos: [1:No Photos Right Trochanter] [N/A:N/A N/A] Wound Location: [1:Pressure Injury] [N/A:N/A] Wounding Event: [1:Pressure Ulcer] [N/A:N/A] Primary Etiology: [1:Hypertension, Type II Diabetes,] [N/A:N/A] Comorbid History:  [1:Paraplegia, Seizure Disorder, Confinement Anxiety 07/31/2019] [N/A:N/A] Date Acquired: [1:2] [N/A:N/A] Weeks of Treatment: [1:Healed - Epithelialized] [N/A:N/A] Wound Status: [1:0x0x0] [N/A:N/A] Measurements L x W x D (cm) [1:0] [N/A:N/A] A (cm) : rea [1:0] [N/A:N/A] Volume (cm) : [1:100.00%] [N/A:N/A] % Reduction in A rea: [1:100.00%] [N/A:N/A] % Reduction in Volume: [1:Category/Stage III] [N/A:N/A] Classification: [1:None Present] [N/A:N/A] Exudate A mount: [1:Distinct, outline attached] [N/A:N/A] Wound Margin: [1:None Present (0%)] [N/A:N/A] Granulation A mount: [1:None Present (0%)] [N/A:N/A] Necrotic A mount: [1:Fascia: No] [N/A:N/A] Exposed Structures: [1:Fat Layer (Subcutaneous Tissue): No Tendon:  No Muscle: No Joint: No Bone: No Large (67-100%)] [N/A:N/A] Treatment Notes Electronic Signature(s) Signed: 10/14/2019 5:12:45 PM By: Baltazar Najjar MD Signed: 10/20/2019 1:15:48 PM By: [name] RN Entered By: Baltazar Najjar on 10/14/2019 16:46:56 -------------------------------------------------------------------------------- Multi-Disciplinary Care Plan Details Patient Name: Date of Service: [name], PA UL A. 10/14/2019 2:45 PM Medical Record Number: [id number] Patient Account Number: [id number] Date of Birth/Sex: Treating RN: 12/03/1965 (54 y.o. [name] Primary Care Jari Carollo: Tally Joe Other Clinician: Referring Alphia Behanna: Treating Timoteo Carreiro/Extender: Arvella Nigh in Treatment: 2 Active Inactive Electronic Signature(s) Signed: 10/20/2019 1:15:48 PM By: [name] RN Entered By: [name] on 10/14/2019 15:43:50 -------------------------------------------------------------------------------- Pain Assessment Details Patient Name: Date of Service: [name], PA UL A. 10/14/2019 2:45 PM Medical Record Number: [id number] Patient Account Number: [id number] Date of Birth/Sex: Treating RN: September 08, 1965 (54 y.o. [name] Primary  Care Rowin Bayron: Tally Joe Other Clinician: Referring Jalan Fariss: Treating Cherly Erno/Extender: Arvella Nigh in Treatment: 2 Active Problems Location of Pain Severity and Description of Pain Patient Has Paino No Site Locations Pain Management and Medication Current Pain Management: Electronic Signature(s) Signed: 10/15/2019 8:07:26 AM By: Karl Ito Signed: 10/20/2019 1:15:48 PM By: [name] RN Entered By: Karl Ito on 10/14/2019 15:13:13 -------------------------------------------------------------------------------- Patient/Caregiver Education Details Patient Name: Date of Service: [name], PA UL A. 9/14/2021andnbsp2:45 PM Medical Record Number: [id number] Patient Account Number: [id number] Date of Birth/Gender: Treating RN: February 25, 1965 (54 y.o. [name] Primary Care Physician: Tally Joe Other Clinician: Referring Physician: Treating Physician/Extender: Arvella Nigh in Treatment: 2 Education Assessment Education Provided To: Patient Education Topics Provided Wound/Skin Impairment: Methods: Explain/Verbal Responses: State content correctly Electronic Signature(s) Signed: 10/20/2019 1:15:48 PM By: [name] RN Entered By: [name] on 10/14/2019 15:44:05 -------------------------------------------------------------------------------- Wound Assessment Details Patient Name: Date of Service: [name], PA UL A. 10/14/2019 2:45 PM Medical Record Number: [id number] Patient Account Number: [id number] Date of Birth/Sex: Treating RN: March 01, 1965 (54 y.o. Katherina Right Primary Care Latoy Labriola: Tally Joe Other Clinician: Referring Alexsis Kathman: Treating O[place]ale Marney/Extender: Arvella Nigh in Treatment: 2 Wound Status Wound Number: 1 Primary Pressure Ulcer Etiology: Wound Location: Right Trochanter Wound Healed - Epithelialized Wounding Event: Pressure Injury Status: Date  Acquired: 07/31/2019 Comorbid Hypertension, Type II Diabetes, Paraplegia, Seizure Disorder, Weeks Of Treatment: 2 History: Confinement Anxiety Clustered Wound: No Photos Photo Uploaded By: Benjaman Kindler on 10/16/2019 11:55:25 Wound Measurements Length: (cm) Width: (cm) Depth: (cm) Area: (cm) Volume: (cm) 0 % Reduction in Area: 100% 0 % Reduction in Volume: 100% 0 Epithelialization: Large (67-100%) 0 Tunneling: No 0 Undermining: No Wound Description Classification: Category/Stage III Wound Margin: Distinct, outline attached Exudate Amount: None Present Foul Odor After Cleansing: No Slough/Fibrino No Wound Bed Granulation Amount: None Present (0%) Exposed Structure Necrotic Amount: None Present (0%) Fascia Exposed: No Fat Layer (Subcutaneous Tissue) Exposed: No Tendon Exposed: No Muscle Exposed: No Joint Exposed: No Bone Exposed: No Electronic Signature(s) Signed: 10/16/2019 4:34:39 PM By: Cherylin Mylar Entered By: Cherylin Mylar on 10/14/2019 15:23:53 -------------------------------------------------------------------------------- Vitals Details Patient Name: Date of Service: [name], PA UL A. 10/14/2019 2:45 PM Medical Record Number: [id number] Patient Account Number: [id number] Date of Birth/Sex: Treating RN: 03-31-1965 (54 y.o. Judie Petit) [name] Primary Care Felissa Blouch: Tally Joe Other Clinician: Referring Johnedward Brodrick: Treating Carel Carrier/Extender: Arvella Nigh in Treatment: 2 Vital Signs Time Taken: 15:12 Temperature (F): 98.4 Height (in): 68 Pulse (bpm): 105 Respiratory Rate (breaths/min): 18 Blood Pressure (mmHg): 154/87 Capillary Blood Glucose (mg/dl): 244 Reference Range:  80 - 120 mg / dl Electronic Signature(s) Signed: 10/15/2019 8:07:26 AM By: Karl Itoawkins, Destiny Entered By: Karl Itoawkins, Destiny on 10/14/2019 15:13:04

## 2019-10-20 NOTE — Progress Notes (Signed)
[name] ([id number]) , Visit Report for 10/14/2019 HPI Details Patient Name: Date of Service: [name], [name] 10/14/2019 2:45 PM Medical Record Number: [id number] Patient Account Number: [id number] Date of Birth/Sex: Treating RN: 30-Dec-1965 (54 y.o. [name] Primary Care Provider: Tally Joe Other Clinician: Referring Provider: Treating Provider/Extender: Arvella Nigh in Treatment: 2 History of Present Illness HPI Description: ADMISSION 09/30/2019 [name] is an unfortunate 54 year old man that we have cared for previously mostly in our clinic in [place]. In fact I saw him in January 2020. We have always been dealing with a problem in the right greater trochanter. He has advanced spinal muscular atrophy. When we first saw him in 2017 I think the area was actually a larger wound which by to their description required debridement by Dr. Meyer Russel. I last saw him last in 2020 everything was closed. We recommended border foam or equivalent to cover this area and help with friction relief etc. They have been doing this religiously. Apparently about 2 months ago they noticed drainage under the border foam and they developed a small open area here. They are here for our review of this. Past medical history; spinal muscular atrophy which is advanced. He is functionally quadriparetic. He can talk. He is total care being cared for at home I think now by his elderly parents. He was diagnosed at age 27. He apparently has respiratory secretion issues. He therefore has to lie on his right side apparently if he is on his left secretions increase. Type 2 diabetes, he has some form of mattress overlay. He also has palliative care 9/14; the patient had a small but punched out wound over the right greater trochanter which is been problematic for him in the past. However in 2 weeks with a Hydrofera Blue and bordered foam the wound is completely healed today. Somewhat  surprising. He has limited by the fact that he has to lie on his right side and night. Apparently this has something to do with pulmonary issues and development of secretions that require suctioning Electronic Signature(s) Signed: 10/14/2019 5:12:45 PM By: Baltazar Najjar MD Entered By: Baltazar Najjar on 10/14/2019 16:48:46 -------------------------------------------------------------------------------- Physical Exam Details Patient Name: Date of Service: [name] FFITT, PA UL A. 10/14/2019 2:45 PM Medical Record Number: [id number] Patient Account Number: [id number] Date of Birth/Sex: Treating RN: 11-05-1965 (54 y.o. [name] Primary Care Provider: Tally Joe Other Clinician: Referring Provider: Treating Provider/Extender: Arvella Nigh in Treatment: 2 Constitutional Patient is hypertensive.. Pulse regular and within target range for patient.Marland Kitchen Respirations regular, non-labored and within target range.. Temperature is normal and within the target range for the patient.Marland Kitchen Appears in no distress. Notes Wound exam; right greater trochanter. This was a very small wound last visit however it had some depth. It is completely closed today. Some scar tissue but absolutely no open wound and what is more nothing really looks threatening. There is no erythema no evidence of Electronic Signature(s) Signed: 10/14/2019 5:12:45 PM By: Baltazar Najjar MD Entered By: Baltazar Najjar on 10/14/2019 16:49:38 -------------------------------------------------------------------------------- Physician Orders Details Patient Name: Date of Service: [name] FFITT, PA UL A. 10/14/2019 2:45 PM Medical Record Number: [id number] Patient Account Number: [id number] Date of Birth/Sex: Treating RN: 04-Apr-1965 (54 y.o. [name] Primary Care Provider: Tally Joe Other Clinician: Referring Provider: Treating Provider/Extender: Arvella Nigh in Treatment: 2 Verbal /  Phone Orders: No Diagnosis Coding ICD-10 Coding Code Description [phone] Pressure ulcer of right hip, stage  3 G12.9 Spinal muscular atrophy, unspecified Discharge From [facility] Services Discharge from Wound Care Center - pad area daily , turn as much as possible Electronic Signature(s) Signed: 10/14/2019 5:12:45 PM By: Baltazar Najjar MD Signed: 10/20/2019 1:15:48 PM By: [name] RN Entered By: [name] on 10/14/2019 15:43:41 -------------------------------------------------------------------------------- Problem List Details Patient Name: Date of Service: [name] FFITT, PA UL A. 10/14/2019 2:45 PM Medical Record Number: [id number] Patient Account Number: [id number] Date of Birth/Sex: Treating RN: [birth year] (54 y.o. [name] Primary Care Provider: Tally Joe Other Clinician: Referring Provider: Treating Provider/Extender: Arvella Nigh in Treatment: 2 Active Problems ICD-10 Encounter Code Description Active Date MDM Diagnosis [phone] Pressure ulcer of right hip, stage 3 09/30/2019 No Yes G12.9 Spinal muscular atrophy, unspecified 09/30/2019 No Yes Inactive Problems Resolved Problems Electronic Signature(s) Signed: 10/14/2019 5:12:45 PM By: Baltazar Najjar MD Entered By: Baltazar Najjar on 10/14/2019 16:46:51 -------------------------------------------------------------------------------- Progress Note Details Patient Name: Date of Service: [name] FFITT, PA UL A. 10/14/2019 2:45 PM Medical Record Number: [id number] Patient Account Number: [id number] Date of Birth/Sex: Treating RN: 12-Sep-1965 (54 y.o. [name] Primary Care Provider: Tally Joe Other Clinician: Referring Provider: Treating Provider/Extender: Arvella Nigh in Treatment: 2 Subjective History of Present Illness (HPI) ADMISSION 09/30/2019 [name] is an unfortunate 54 year old man that we have cared for previously mostly in our clinic in [place]. In  fact I saw him in January 2020. We have always been dealing with a problem in the right greater trochanter. He has advanced spinal muscular atrophy. When we first saw him in 2017 I think the area was actually a larger wound which by to their description required debridement by Dr. Meyer Russel. I last saw him last in 2020 everything was closed. We recommended border foam or equivalent to cover this area and help with friction relief etc. They have been doing this religiously. Apparently about 2 months ago they noticed drainage under the border foam and they developed a small open area here. They are here for our review of this. Past medical history; spinal muscular atrophy which is advanced. He is functionally quadriparetic. He can talk. He is total care being cared for at home I think now by his elderly parents. He was diagnosed at age 49. He apparently has respiratory secretion issues. He therefore has to lie on his right side apparently if he is on his left secretions increase. Type 2 diabetes, he has some form of mattress overlay. He also has palliative care 9/14; the patient had a small but punched out wound over the right greater trochanter which is been problematic for him in the past. However in 2 weeks with a Hydrofera Blue and bordered foam the wound is completely healed today. Somewhat surprising. He has limited by the fact that he has to lie on his right side and night. Apparently this has something to do with pulmonary issues and development of secretions that require suctioning Objective Constitutional Patient is hypertensive.. Pulse regular and within target range for patient.Marland Kitchen Respirations regular, non-labored and within target range.. Temperature is normal and within the target range for the patient.Marland Kitchen Appears in no distress. Vitals Time Taken: 3:12 PM, Height: 68 in, Temperature: 98.4 F, Pulse: 105 bpm, Respiratory Rate: 18 breaths/min, Blood Pressure: 154/87 mmHg, Capillary Blood  Glucose: 120 mg/dl. General Notes: Wound exam; right greater trochanter. This was a very small wound last visit however it had some depth. It is completely closed today. Some scar tissue but absolutely no open wound  and what is more nothing really looks threatening. There is no erythema no evidence of Integumentary (Hair, Skin) Wound #1 status is Healed - Epithelialized. Original cause of wound was Pressure Injury. The wound is located on the Right Trochanter. The wound measures 0cm length x 0cm width x 0cm depth; 0cm^2 area and 0cm^3 volume. There is no tunneling or undermining noted. There is a none present amount of drainage noted. The wound margin is distinct with the outline attached to the wound base. There is no granulation within the wound bed. There is no necrotic tissue within the wound bed. Assessment Active Problems ICD-10 Pressure ulcer of right hip, stage 3 Spinal muscular atrophy, unspecified Plan Discharge From [facility] Services: Discharge from Wound Care Center - pad area daily , turn as much as possible 1. The patient can be discharged from the clinic 2. We advised padding this area at all times when he is in bed. They have already been doing this in the past. 3. This is been a problem recurrently. I I went over prevention techniques but I think were limited by the fact that the patient has to lie on the right side constantly at home Electronic Signature(s) Signed: 10/14/2019 5:12:45 PM By: Baltazar Najjar MD Entered By: Baltazar Najjar on 10/14/2019 16:50:24 -------------------------------------------------------------------------------- SuperBill Details Patient Name: Date of Service: [name] FFITT, PA UL A. 10/14/2019 Medical Record Number: [id number] Patient Account Number: [id number] Date of Birth/Sex: Treating RN: 08/01/1965 (54 y.o. [name] Primary Care Provider: Tally Joe Other Clinician: Referring Provider: Treating Provider/Extender: Arvella Nigh in Treatment: 2 Diagnosis Coding ICD-10 Codes Code Description [phone] Pressure ulcer of right hip, stage 3 G12.9 Spinal muscular atrophy, unspecified Facility Procedures CPT4 Code: [id number] Description: 99213 - WOUND CARE VISIT-LEV 3 EST PT Modifier: Quantity: 1 Physician Procedures : CPT4 Code Description Modifier [id number] - WC PHYS LEVEL 2 - EST PT ICD-10 Diagnosis Description L89.213 Pressure ulcer of right hip, stage 3 Quantity: 1 Electronic Signature(s) Signed: 10/14/2019 5:12:45 PM By: Baltazar Najjar MD Entered By: Baltazar Najjar on 10/14/2019 16:51:32

## 2019-10-28 ENCOUNTER — Other Ambulatory Visit: Payer: Self-pay

## 2019-10-28 ENCOUNTER — Ambulatory Visit: Payer: Medicare Other | Admitting: Internal Medicine

## 2019-10-28 ENCOUNTER — Encounter: Payer: Self-pay | Admitting: Internal Medicine

## 2019-10-28 VITALS — BP 160/98 | HR 99

## 2019-10-28 DIAGNOSIS — Z794 Long term (current) use of insulin: Secondary | ICD-10-CM

## 2019-10-28 DIAGNOSIS — E785 Hyperlipidemia, unspecified: Secondary | ICD-10-CM | POA: Diagnosis not present

## 2019-10-28 DIAGNOSIS — E1165 Type 2 diabetes mellitus with hyperglycemia: Secondary | ICD-10-CM | POA: Diagnosis not present

## 2019-10-28 LAB — POCT GLYCOSYLATED HEMOGLOBIN (HGB A1C): Hemoglobin A1C: 7.9 % — AB (ref 4.0–5.6)

## 2019-10-28 LAB — LIPID PANEL
Cholesterol: 110 mg/dL (ref 0–200)
HDL: 36.2 mg/dL — ABNORMAL LOW (ref >39.00)
NonHDL: 73.34
Total CHOL/HDL Ratio: 3
Triglycerides: 342 mg/dL — ABNORMAL HIGH (ref 0.0–149.0)
VLDL: 68.4 mg/dL — ABNORMAL HIGH (ref 0.0–40.0)

## 2019-10-28 LAB — LDL CHOLESTEROL, DIRECT: Direct LDL: 47 mg/dL

## 2019-10-28 MED ORDER — INSULIN LISPRO (1 UNIT DIAL) 100 UNIT/ML (KWIKPEN): PEN_INJECTOR | SUBCUTANEOUS | 3 refills | Status: AC

## 2019-10-28 MED ORDER — TOUJEO MAX SOLOSTAR 300 UNIT/ML ~~LOC~~ SOPN: PEN_INJECTOR | SUBCUTANEOUS | 3 refills | Status: AC

## 2019-10-28 MED ORDER — FREESTYLE LIBRE 14 DAY SENSOR MISC: 3 refills | Status: AC

## 2019-10-28 MED ORDER — TRULICITY 3 MG/0.5ML ~~LOC~~ SOAJ: 3.0000 mg | SUBCUTANEOUS | 3 refills | Status: AC

## 2019-10-28 NOTE — Progress Notes (Signed)
Patient ID: [name], male   DOB: 1965/09/18, 54 y.o.   MRN: [id number]  This visit occurred during the SARS-CoV-2 public health emergency.  Safety protocols were in place, including screening questions prior to the visit, additional usage of staff PPE, and extensive cleaning of exam room while observing appropriate contact time as indicated for disinfecting solutions.   HPI: [name] is a 54 y.o.-year-old male, returning to f/u for DM2, dx in 2003, insulin-dependent, uncontrolled, without complications. Last visit 1 year ago.  The appointment is usually carried through his mother as patient cannot speak well.  They are again late, now almost 20 minutes. He has [employer] insurance.  Patient has SMA type III (motor neuron disease), dx 1972, and he is in the wheelchair.  His swallowing continues to worsen.  His mother is checking his sugars and give him the medicines.  Unfortunately, she is frequently sick and cannot give him the medications.  His father cannot inject him with insulin as his hands are shaking.   He continues to have pressure ulcers on hips, managed by wound care. These have opened up recently >> now has special bandages >> healed.  He continues to have more weakness and GERD.  Since last visit, he started Evrysdi (risdiplam) for the SMA -in a research trial.   Reviewed HbA1c levels: 07/2019: HbA1c 7.0% Lab Results  Component Value Date   HGBA1C 9.5 01/02/2019   HGBA1C 9.2 (A) 10/21/2018   HGBA1C 8.7 (A) 02/12/2018  10/29/2017: HbA1c 8% 06/21/2015: HbA1c 8.2% 02/23/2015: HbA1C: 6.3% 12/15/2013: HbA1c 8.9% 06/12/2013: HbA1c 7.8% 12/13/2012: HbA1c 7.5% 12/07/2011: HbA1c 6.9% 05/30/2010: HbA1c 8.3%  Pt is on a regimen of: - Trulicity 1.5 >> 3 mg in the a.m. - Humalog 18-26 before meals - sometimes eating one time a day - Toujeo 50-60 >> 60-80 >> 60 >> 30-40 units at bedtime If you do not eat b'fast, and sugars are >130, give 10 units of NovoLog. He tried  Metformin x2 >> diarrhea. He was on Onglyza >> now stopped while on Trulicity.  Patient's mom is checking his sugars more than 4 times a day with his freestyle libre CGM.    Previously:   Prev: - am: 107-162 >> 90-138, 149 >> 150-180 >> n/c - 2h after b'fast: 123-197 >> 170-203 >> n/c  - before lunch: 61, 78-146, 150 >> see above >> 120-140 - 2h after lunch: 187-233, 287 >> 180-260 >> 180-200 - before dinner: 61, 189  >> 260-270 >> 140-160 - after dinner: 149, 215-238 >> 270-300 >> 200-300 - bedtime: 241-311, 345 >> 220-270 He has hypoglycemia awareness in the 70s. Lowest sugars, 61 >> 90s >> 140 >> 70 Highest sugars, 345 >> 300s >> 300s >> 200s  -No CKD, last BUN/creatinine:  Lab Results  Component Value Date   BUN 5 (L) 10/21/2018   CREATININE 0.22 (L) 10/21/2018  10/29/2017: 3/0.21 On lisinopril 20 (increased). -+ HL;  last set of lipids: Lab Results  Component Value Date   CHOL 125 10/21/2018   HDL 34.70 (L) 10/21/2018   LDLCALC 60 12/15/2013   LDLDIRECT 62.0 10/21/2018   TRIG 302.0 (H) 10/21/2018   CHOLHDL 4 10/21/2018  10/29/2017: 114/218/34/36 02/23/2015: 138/37/306/40  On Crestor 40.  He could not start fenofibrate because of the large capsule. 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 -No numbness and tingling in his feet.  He still has sensation but cannot move  ROS: Constitutional: no weight gain/no weight loss,  no fatigue, no subjective hyperthermia, no subjective hypothermia Eyes: no blurry vision, no xerophthalmia ENT: no sore throat, no nodules palpated in neck, + dysphagia, no odynophagia, no hoarseness Cardiovascular: no CP/no SOB/no palpitations/no leg swelling Respiratory: no cough/no SOB/no wheezing Gastrointestinal: no N/no V/no D/no C/no acid reflux Musculoskeletal: + Muscle aches/no joint aches Skin: no rashes, no hair loss Neurological: + Tremors/+ generalized weakness  I reviewed pt's medications, allergies, PMH, social hx, family  hx, and changes were documented in the history of present illness. Otherwise, unchanged from my initial visit note.  Past Surgical History:  Procedure Laterality Date  . muscle bisopy     History   Social History  . Marital Status: single    Spouse Name: N/A    Number of Children: 0   Occupational History  . disabled   Social History Main Topics  . Smoking status: Former Games developer  . Smokeless tobacco: Not on file  . Alcohol Use: No  . Drug Use: No   Current Outpatient Medications on File Prior to Visit  Medication Sig Dispense Refill  . BD PEN NEEDLE NANO U/F 32G X 4 MM MISC USE ONE NEEDLE DAILY WITH LANTUS SOLOSTAR AS DIRECTED 100 each 0  . CANASA 1000 MG suppository Place 1,000 mg rectally at bedtime as needed (for ulcerative proctitis).   1  . Continuous Blood Gluc Receiver (FREESTYLE LIBRE 14 DAY READER) DEVI 1 Device by Does not apply route See admin instructions. Use with 14 day sensor 1 each 0  . Continuous Blood Gluc Sensor (FREESTYLE LIBRE 14 DAY SENSOR) MISC APPLY EVERY 14 (FOURTEEN) DAYS. 2 each 0  . EVRYSDI 0.75 MG/ML SOLR     . fenofibrate micronized (LOFIBRA) 134 MG capsule TAKE 1 CAPSULE BY MOUTH DAILY BEFORE BREAKFAST. 90 capsule 0  . gabapentin (NEURONTIN) 250 MG/5ML solution gabapentin 250 mg/5 mL oral solution    . glucose blood (ONETOUCH VERIO) test strip Use to test blood sugar 3 times daily as instructed. 300 each 11  . HYDROcodone-acetaminophen (HYCET) 7.5-325 mg/15 ml solution Take 10 mLs by mouth at bedtime.    . insulin glargine, 2 Unit Dial, (TOUJEO MAX SOLOSTAR) 300 UNIT/ML Solostar Pen INJECT 50-60 UNITS DAILY AT BEDTIME. 18 mL 1  . insulin lispro (HUMALOG KWIKPEN) 100 UNIT/ML KwikPen INJECT 22-28 UNITS 3 TIMES DAILY INTO THE SKIN. 75.6 mL 1  . lisinopril (PRINIVIL,ZESTRIL) 10 MG tablet Take 10 mg by mouth daily.  3  . mirtazapine (REMERON) 15 MG tablet Take 15 mg by mouth at bedtime.    Marland Kitchen omeprazole (PRILOSEC) 40 MG capsule Take 1 capsule (40 mg total)  by mouth daily. 14 capsule 1  . ONETOUCH DELICA LANCETS 33G MISC Use to test blood sugar 2 times daily as instructed. 100 each 11  . pantoprazole (PROTONIX) 40 MG tablet Take 40 mg by mouth daily.    . rosuvastatin (CRESTOR) 5 MG tablet Take 5 mg by mouth at bedtime.     . TRULICITY 3 MG/0.5ML SOPN INJECT 3mg  SUBCUTANEOUSLY ONCE A WEEK 2 mL 1   No current facility-administered medications on file prior to visit.    Allergies  Allergen Reactions  . Bactrim Ds [Sulfamethoxazole-Trimethoprim] Other (See Comments)    Pill causes gagging  . Carafate [Sucralfate] Other (See Comments)    Gagging  . Latex Other (See Comments)    Extremely irritated, fire engine red skin  . Penicillins Hives    Has patient had a PCN reaction causing immediate rash, facial/tongue/throat swelling, SOB or  lightheadedness with hypotension:unknown Has patient had a PCN reaction causing severe rash involving mucus membranes or skin necrosis:unknown Has patient had a PCN reaction that required hospitalization:unknown Has patient had a PCN reaction occurring within the last 10 years:infant reaction If all of the above answers are "NO", then may proceed with Cephalosporin use.   . Clindamycin/Lincomycin Itching, Swelling and Rash   Family History  Problem Relation Age of Onset  . Diabetes Mother   . Colon polyps Mother   . Heart disease Father        CAD  . Diabetes Father    PE: BP (!) 160/98 (BP Location: Right Arm, Patient Position: Sitting, Cuff Size: Small)   Pulse 99   SpO2 95%  There is no height or weight on file to calculate BMI.  Could not be weighed today due to immobility. Wt Readings from Last 3 Encounters:  05/17/16 150 lb (68 kg)  07/25/15 165 lb (74.8 kg)  02/11/14 165 lb (74.8 kg)   Constitutional: overweight, in NAD.  He only moves few muscle groups, but can only talk with difficulty Eyes: PERRLA, EOMI, no exophthalmos ENT: moist mucous membranes, no thyromegaly, no cervical  lymphadenopathy Cardiovascular: RRR, No MRG, + bilateral leg swelling Respiratory: CTA B Gastrointestinal: abdomen soft, NT, ND, BS+ Musculoskeletal: + Bilateral muscle atrophy Skin: moist, warm, no rashes Neurological: + Tetraparesis  Constitutional: overweight, in NAD, in wheelchair.  He moves few muscle groups, but cannot talk and eat with difficulty Eyes: PERRLA, EOMI, no exophthalmos ENT: moist mucous membranes, no thyromegaly, no cervical lymphadenopathy Cardiovascular: RRR, No MRG, + bilateral leg swelling Respiratory: CTA B Gastrointestinal: abdomen soft, NT, ND, BS+ Musculoskeletal: + Bilateral muscle atrophy in hands Skin: moist, warm, no rashes Neurological: no tremor with outstretched hands, + tetraparesis  ASSESSMENT: 1. DM2, insulin-dependent, uncontrolled, without long term complications, But with hyperglycemia - We discussed briefly about Afrezza, the inhaled insulin, but I do not think this would be a good option for him. - We did discuss abou alternative regimen with Humalog 50/50 to reduce the number of injections, however, he is eating his first meal of the day between 12 and 2 PM and the last meal of the day around 7 PM, and this would not work well with this type of regimen.  2. HL  PLAN:  1. Patient with longstanding, uncontrolled, type 2 diabetes, on basal-bolus insulin regimen and weekly GLP-1 receptor agonist, increased at last visit.  His mother is giving him the injections and checking his sugars with the freestyle libre CGM.  However, she is occasionally not home as she has multiple medical problems and may be admitted to the hospital and in these situations, patient is not getting his injections.  At last visit, HbA1c was higher, at 9.2%, but he had an even higher HbA1c obtained 3 months later, in 12/2018, at 9.5%.  He returns after long absence of a year. -Since last visit, patient's condition continues to deteriorate as he has problems swallowing and has  frequent choking with secretions.  Also, he is getting weaker.  He is on hospice. -CGM interpretation: -At today's visit, we reviewed together his CGM tracings.  It appears that overall, the sugars have improved from last visit, with 65% of them in target range (target more than 70%).  He is having 29% of blood sugars in the 181-250 range and 6% above this.  He has no lows.  There appears to be a clear pattern which is consistent from prior, but less  pronounced, or decreasing blood sugars overnight to target range around 12 PM, after which, his sugars start increasing when he eats.  I explained that this is a sign of not enough mealtime coverage.  Mother has reduced the dose of his long-acting insulin and he is now taking between 30 and 40 units with higher doses when his sugars are higher at bedtime.  We discussed that it is not advisable to increase the long-acting insulin based on the high blood sugars at bedtime, but we need to prevent these by using Humalog before meals, correctly, at an appropriate dose.  I do not feel that he needs more Humalog, but he does not appear to take it consistently before his meals, but occasionally later, and also, I suspect that he is not given these injections every time he eats.  Mother tells me that he sometimes only eating 1 meal a day but I did advise them to give him a lower dose of Humalog if his sugars appear to increase, even if he is not prepared to eat a meal at that time.  Otherwise, I do not feel we need to change his regimen for now. - I advised him to: Patient Instructions  Please continue: - Toujeo 30-40 units at bedtime - Trulicity 3 mg weekly in a.m.  Please use: - Humalog 22-26 units 15 min before each meal If you are not eating and sugars are trending up, may need up to 15 units of Humalog  Please return in 4 months.   - we checked his HbA1c: 7.9% (better c/w last OV) - advised to check sugars at different times of the day - 4x a day, rotating  check times - advised for yearly eye exams >> he is not UTD - return to clinic in 4 months   2. HL -Reviewed his latest lipid panel from 2020: Triglycerides higher than before, above target, HDL low, LDL excellent -Continues Crestor 40 without side effects.  He cannot take fenofibrate due to the large pill. -will check lipids today   Component     Latest Ref Rng & Units 10/28/2019  Glucose     65 - 99 mg/dL 371 (H)  BUN     7 - 25 mg/dL 4 (L)  Creatinine     6.96 - 1.33 mg/dL 0.2 (L)  GFR, Est Non African American     > OR = 60 mL/min/1.61m2 180  GFR, Est African American     > OR = 60 mL/min/1.17m2 209  BUN/Creatinine Ratio     6 - 22 (calc) 20  Sodium     135 - 146 mmol/L 127 (L)  Potassium     3.5 - 5.3 mmol/L 4.1  Chloride     98 - 110 mmol/L 92 (L)  CO2     20 - 32 mmol/L 22  Calcium     8.6 - 10.3 mg/dL 9.1  Total Protein     6.1 - 8.1 g/dL 6.3  Albumin MSPROF     3.6 - 5.1 g/dL 3.9  Globulin     1.9 - 3.7 g/dL (calc) 2.4  AG Ratio     1.0 - 2.5 (calc) 1.6  Total Bilirubin     0.2 - 1.2 mg/dL 0.6  Alkaline phosphatase (APISO)     35 - 144 U/L 34 (L)  AST     10 - 35 U/L 12  ALT     9 - 46 U/L 10  Cholesterol  0 - 200 mg/dL 161  Triglycerides     0 - 149 mg/dL 096.0 (H)  HDL Cholesterol     >39.00 mg/dL 45.40 (L)  VLDL     0.0 - 40.0 mg/dL 98.1 (H)  Total CHOL/HDL Ratio      3  NonHDL      73.34  Direct LDL     mg/dL 19.1   Sodium quite low (corrected for hyperglycemia: 129) and chloride also low, possibly due to dehydration.  Triglycerides slightly higher.  LDL at goal.  Glucose high.  We will send results to PCP.  I will advise patient to follow-up with PCP for the sodium.  Carlus Pavlov, MD PhD [facility]

## 2019-10-28 NOTE — Patient Instructions (Signed)
Please continue: - Toujeo 30-40 units at bedtime - Trulicity 3 mg weekly in a.m.  Please use: - Humalog 22-26 units 15 min before each meal If you are not eating and sugars are trending up, may need up to 15 units of Humalog  Please return in 4 months.

## 2019-10-29 LAB — COMPLETE METABOLIC PANEL WITH GFR
AG Ratio: 1.6 (calc) (ref 1.0–2.5)
ALT: 10 U/L (ref 9–46)
AST: 12 U/L (ref 10–35)
Albumin: 3.9 g/dL (ref 3.6–5.1)
Alkaline phosphatase (APISO): 34 U/L — ABNORMAL LOW (ref 35–144)
BUN/Creatinine Ratio: 20 (calc) (ref 6–22)
BUN: 4 mg/dL — ABNORMAL LOW (ref 7–25)
CO2: 22 mmol/L (ref 20–32)
Calcium: 9.1 mg/dL (ref 8.6–10.3)
Chloride: 92 mmol/L — ABNORMAL LOW (ref 98–110)
Creat: 0.2 mg/dL — ABNORMAL LOW (ref 0.70–1.33)
GFR, Est African American: 209 mL/min/{1.73_m2} (ref >=60)
GFR, Est Non African American: 180 mL/min/{1.73_m2} (ref >=60)
Globulin: 2.4 g/dL (calc) (ref 1.9–3.7)
Glucose, Bld: 229 mg/dL — ABNORMAL HIGH (ref 65–99)
Potassium: 4.1 mmol/L (ref 3.5–5.3)
Sodium: 127 mmol/L — ABNORMAL LOW (ref 135–146)
Total Bilirubin: 0.6 mg/dL (ref 0.2–1.2)
Total Protein: 6.3 g/dL (ref 6.1–8.1)

## 2019-11-25 ENCOUNTER — Telehealth: Payer: Self-pay

## 2019-11-25 NOTE — Telephone Encounter (Signed)
Phone call placed to patient's mother,Nancy, to check in and offer to schedule visit with Palliative NP. Scheduled for 11/27/19. Harriett Sine shared that she feels patient is declining. She notes increased weakness, poor trunk and neck control,decreased po intake. Harriett Sine noted that patient appears thinner as well. Harriett Sine became tearful and shared that she recently lost her oldest son this past March. Offered to have someone from Bereavement services reach out to her but she declined at this time. Visit scheduled with NP

## 2019-12-12 ENCOUNTER — Encounter (HOSPITAL_BASED_OUTPATIENT_CLINIC_OR_DEPARTMENT_OTHER): Payer: Medicare Other | Attending: Internal Medicine | Admitting: Internal Medicine

## 2019-12-12 ENCOUNTER — Other Ambulatory Visit: Payer: Self-pay

## 2019-12-12 DIAGNOSIS — G129 Spinal muscular atrophy, unspecified: Secondary | ICD-10-CM | POA: Diagnosis not present

## 2019-12-12 DIAGNOSIS — Z87891 Personal history of nicotine dependence: Secondary | ICD-10-CM | POA: Insufficient documentation

## 2019-12-12 DIAGNOSIS — L89219 Pressure ulcer of right hip, unspecified stage: Secondary | ICD-10-CM | POA: Insufficient documentation

## 2019-12-12 NOTE — Progress Notes (Signed)
Tory DAILEY BUCCHERI ([id number]) , Visit Report for 12/12/2019 Abuse/Suicide Risk Screen Details Patient Name: Date of Service: [name], [name] 12/12/2019 2:45 PM Medical Record Number: [id number] Patient Account Number: [id number] Date of Birth/Sex: Treating RN: 1965-12-26 (54 y.o. Judie Petit) [name] Primary Care Aalliyah Kilker: Tally Joe Other Clinician: Referring Everton Bertha: Treating Gerod Caligiuri/Extender: Arvella Nigh in Treatment: 0 Abuse/Suicide Risk Screen Items Answer ABUSE RISK SCREEN: Has anyone close to you tried to hurt or harm you recentlyo No Do you feel uncomfortable with anyone in your familyo No Has anyone forced you do things that you didnt want to doo No Electronic Signature(s) Signed: 12/12/2019 5:42:44 PM By: [name] RN Entered By: [name] on 12/12/2019 15:25:04 -------------------------------------------------------------------------------- Activities of Daily Living Details Patient Name: Date of Service: [name], [name] 12/12/2019 2:45 PM Medical Record Number: [id number] Patient Account Number: [id number] Date of Birth/Sex: Treating RN: February 26, 1965 (54 y.o. Judie Petit) [name] Primary Care Daionna Crossland: Tally Joe Other Clinician: Referring Shye Doty: Treating Devetta Hagenow/Extender: Arvella Nigh in Treatment: 0 Activities of Daily Living Items Answer Activities of Daily Living (Please select one for each item) Drive Automobile Not Able T Medications ake Need Assistance Use T elephone Need Assistance Care for Appearance Not Able Use T oilet Not Able Mady Haagensen / Shower Not Able Dress Self Not Able Feed Self Not Able Walk Not Able Get In / Out Bed Not Able Housework Not Able Prepare Meals Not Able Handle Money Not Able Shop for Self Not Able Electronic Signature(s) Signed: 12/12/2019 5:42:44 PM By: [name] RN Entered By: [name] on 12/12/2019  15:25:43 -------------------------------------------------------------------------------- Education Screening Details Patient Name: Date of Service: [name] FFITT, PA UL A. 12/12/2019 2:45 PM Medical Record Number: [id number] Patient Account Number: [id number] Date of Birth/Sex: Treating RN: Apr 04, 1965 (54 y.o. Judie Petit) [name] Primary Care Trenity Pha: Tally Joe Other Clinician: Referring Brizza Nathanson: Treating Angelis Gates/Extender: Arvella Nigh in Treatment: 0 Primary Learner Assessed: Patient Learning Preferences/Education Level/Primary Language Learning Preference: Explanation Highest Education Level: College or Above Preferred Language: English Cognitive Barrier Language Barrier: No Translator Needed: No Memory Deficit: No Emotional Barrier: No Cultural/Religious Beliefs Affecting Medical Care: No Physical Barrier Impaired Vision: No Impaired Hearing: No Decreased Hand dexterity: No Knowledge/Comprehension Knowledge Level: Medium Comprehension Level: High Ability to understand written instructions: High Ability to understand verbal instructions: High Motivation Anxiety Level: Anxious Cooperation: Cooperative Education Importance: Acknowledges Need Interest in Health Problems: Asks Questions Perception: Coherent Willingness to Engage in Self-Management High Activities: Readiness to Engage in Self-Management High Activities: Electronic Signature(s) Signed: 12/12/2019 5:42:44 PM By: [name] RN Entered By: [name] on 12/12/2019 15:26:13 -------------------------------------------------------------------------------- Fall Risk Assessment Details Patient Name: Date of Service: [name] FFITT, PA UL A. 12/12/2019 2:45 PM Medical Record Number: [id number] Patient Account Number: [id number] Date of Birth/Sex: Treating RN: 03-Apr-1965 (54 y.o. Judie Petit) [name] Primary Care Emillee Talsma: Tally Joe Other Clinician: Referring Maryfrances Portugal: Treating  Darlena Koval/Extender: Arvella Nigh in Treatment: 0 Fall Risk Assessment Items Have you had 2 or more falls in the last 12 monthso 0 No Have you had any fall that resulted in injury in the last 12 monthso 0 No FALLS RISK SCREEN History of falling - immediate or within 3 months 0 No Secondary diagnosis (Do you have 2 or more medical diagnoseso) 0 No Ambulatory aid None/bed rest/wheelchair/nurse 0 No Crutches/cane/walker 0 No Furniture 0 No Intravenous therapy Access/Saline/Heparin Lock 0 No Gait/Transferring Normal/ bed rest/ wheelchair 0 No Weak (short steps with  or without shuffle, stooped but able to lift head while walking, may seek 0 No support from furniture) Impaired (short steps with shuffle, may have difficulty arising from chair, head down, impaired 0 No balance) Mental Status Oriented to own ability 0 No Electronic Signature(s) Signed: 12/12/2019 5:42:44 PM By: [name] RN Entered By: [name] on 12/12/2019 15:26:38 -------------------------------------------------------------------------------- Nutrition Risk Screening Details Patient Name: Date of Service: [name] FFITT, PA UL A. 12/12/2019 2:45 PM Medical Record Number: [id number] Patient Account Number: [id number] Date of Birth/Sex: Treating RN: Jun 21, 1965 (54 y.o. Judie Petit) [name] Primary Care Javarri Segal: Tally Joe Other Clinician: Referring Fabiana Dromgoole: Treating Crytal Pensinger/Extender: Arvella Nigh in Treatment: 0 Height (in): 67 Weight (lbs): 130 Body Mass Index (BMI): 20.4 Nutrition Risk Screening Items Score Screening NUTRITION RISK SCREEN: I have an illness or condition that made me change the kind and/or amount of food I eat 0 No I eat fewer than two meals per day 0 No I eat few fruits and vegetables, or milk products 0 No I have three or more drinks of beer, liquor or wine almost every day 0 No I have tooth or mouth problems that make it hard for me to eat 0  No I don't always have enough money to buy the food I need 0 No I eat alone most of the time 0 No I take three or more different prescribed or over-the-counter drugs a day 1 Yes Without wanting to, I have lost or gained 10 pounds in the last six months 0 No I am not always physically able to shop, cook and/or feed myself 0 No Nutrition Protocols Good Risk Protocol 0 No interventions needed Moderate Risk Protocol High Risk Proctocol Risk Level: Good Risk Score: 1 Electronic Signature(s) Signed: 12/12/2019 5:42:44 PM By: [name] RN Entered By: [name] on 12/12/2019 15:27:12

## 2019-12-12 NOTE — Progress Notes (Signed)
[name] ([id number]) , Visit Report for 12/12/2019 Allergy List Details Patient Name: Date of Service: [name], [name] 12/12/2019 2:45 PM Medical Record Number: [id number] Patient Account Number: [id number] Date of Birth/Sex: Treating RN: 06-Mar-1965 (54 y.o. Judie Petit) [name] Primary Care Arda Keadle: Tally Joe Other Clinician: Referring Marvell Tamer: Treating Vontrell Pullman/Extender: Arvella Nigh in Treatment: 0 Allergies Active Allergies penicillin Reaction: hives Severity: Severe Allergy Notes Electronic Signature(s) Signed: 12/12/2019 5:42:44 PM By: [name] RN Entered By: [name] on 12/12/2019 15:23:29 -------------------------------------------------------------------------------- Arrival Information Details Patient Name: Date of Service: [name], [place] UL A. 12/12/2019 2:45 PM Medical Record Number: [id number] Patient Account Number: [id number] Date of Birth/Sex: Treating RN: [birth year] (54 y.o. Judie Petit) [name] Primary Care Lindsee Labarre: Tally Joe Other Clinician: Referring Wilhelmena Zea: Treating Keisy Strickler/Extender: Arvella Nigh in Treatment: 0 Visit Information Patient Arrived: Wheel Chair Arrival Time: 15:21 Accompanied By: mother Transfer Assistance: None Patient Identification Verified: Yes Secondary Verification Process Completed: Yes Patient Requires Transmission-Based Precautions: No Patient Has Alerts: No History Since Last Visit All ordered tests and consults were completed: No Added or deleted any medications: No Any new allergies or adverse reactions: No Had a fall or experienced change in activities of daily living that may affect risk of falls: No Signs or symptoms of abuse/neglect since last visito No Hospitalized since last visit: No Implantable device outside of the clinic excluding cellular tissue based products placed in the center since last visit: No Pain Present Now: No Electronic  Signature(s) Signed: 12/12/2019 5:42:44 PM By: [name] RN Entered By: [name] on 12/12/2019 15:22:21 -------------------------------------------------------------------------------- Clinic Level of Care Assessment Details Patient Name: Date of Service: [name], [place] UL A. 12/12/2019 2:45 PM Medical Record Number: [id number] Patient Account Number: [id number] Date of Birth/Sex: Treating RN: 10-23-1965 (54 y.o. Damaris Schooner Primary Care Bea Duren: Tally Joe Other Clinician: Referring Chancey Cullinane: Treating Lyonel Morejon/Extender: Arvella Nigh in Treatment: 0 Clinic Level of Care Assessment Items TOOL 2 Quantity Score []  - 0 Use when only an EandM is performed on the INITIAL visit ASSESSMENTS - Nursing Assessment / Reassessment X- 1 20 General Physical Exam (combine w/ comprehensive assessment (listed just below) when performed on new pt. evals) X- 1 25 Comprehensive Assessment (HX, ROS, Risk Assessments, Wounds Hx, etc.) ASSESSMENTS - Wound and Skin A ssessment / Reassessment []  - 0 Simple Wound Assessment / Reassessment - one wound []  - 0 Complex Wound Assessment / Reassessment - multiple wounds X- 1 10 Dermatologic / Skin Assessment (not related to wound area) ASSESSMENTS - Ostomy and/or Continence Assessment and Care []  - 0 Incontinence Assessment and Management []  - 0 Ostomy Care Assessment and Management (repouching, etc.) PROCESS - Coordination of Care X - Simple Patient / Family Education for ongoing care 1 15 []  - 0 Complex (extensive) Patient / Family Education for ongoing care X- 1 10 Staff obtains , Records, T Results / Process Orders est []  - 0 Staff telephones HHA, Nursing Homes / Clarify orders / etc []  - 0 Routine Transfer to another Facility (non-emergent condition) []  - 0 Routine Hospital Admission (non-emergent condition) []  - 0 New Admissions / / Ordering NPWT Apligraf, etc. , []  -  0 Emergency Hospital Admission (emergent condition) X- 1 10 Simple Discharge Coordination []  - 0 Complex (extensive) Discharge Coordination PROCESS - Special Needs []  - 0 Pediatric / Minor Patient Management []  - 0 Isolation Patient Management []  - 0 Hearing / Language / Visual special needs []  -  0 Assessment of Community assistance (transportation, D/C planning, etc.) []  - 0 Additional assistance / Altered mentation []  - 0 Support Surface(s) Assessment (bed, cushion, seat, etc.) INTERVENTIONS - Wound Cleansing / Measurement []  - 0 Wound Imaging (photographs - any number of wounds) []  - 0 Wound Tracing (instead of photographs) []  - 0 Simple Wound Measurement - one wound []  - 0 Complex Wound Measurement - multiple wounds []  - 0 Simple Wound Cleansing - one wound []  - 0 Complex Wound Cleansing - multiple wounds INTERVENTIONS - Wound Dressings []  - 0 Small Wound Dressing one or multiple wounds []  - 0 Medium Wound Dressing one or multiple wounds []  - 0 Large Wound Dressing one or multiple wounds []  - 0 Application of Medications - injection INTERVENTIONS - Miscellaneous []  - 0 External ear exam []  - 0 Specimen Collection (cultures, biopsies, blood, body fluids, etc.) []  - 0 Specimen(s) / Culture(s) sent or taken to Lab for analysis X- 1 10 Patient Transfer (multiple staff / / Similar devices) []  - 0 Simple Staple / Suture removal (25 or less) []  - 0 Complex Staple / Suture removal (26 or more) []  - 0 Hypo / Hyperglycemic Management (close monitor of Blood Glucose) []  - 0 Ankle / Brachial Index (ABI) - do not check if billed separately Has the patient been seen at the hospital within the last three years: Yes Total Score: 100 Level Of Care: New/Established - Level 3 Electronic Signature(s) Signed: 12/12/2019 5:58:22 PM By: RN, BSN Entered By: on 12/12/2019  15:46:42 -------------------------------------------------------------------------------- Encounter Discharge Information Details Patient Name: Date of Service: [name], [place] UL A. 12/12/2019 2:45 PM Medical Record Number: Patient Account Number: Date of Birth/Sex: Treating RN: 1965-09-28 (54 y.o. Primary Care Kaipo [name]: Other Clinician: Referring Eymi Lipuma: Treating Raye Wiens/Extender: in Treatment: 0 Encounter Discharge Information Items Discharge Condition: Stable Ambulatory Status: Wheelchair Discharge Destination: Home Transportation: Private Auto Accompanied By: mother Schedule Follow-up Appointment: No Clinical Summary of Care: Electronic Signature(s) Signed: 12/12/2019 5:09:42 PM By: Entered By: Nurse, adult on 12/12/2019 17:08:13 -------------------------------------------------------------------------------- Patient/Caregiver Education Details Patient Name: Date of Service: [name], [place] UL A. 11/12/2021andnbsp2:45 PM Medical Record Number: Patient Account Number: Date of Birth/Gender: Treating RN: [birth year] (54 y.o. Zenaida Deed Primary Care Physician: 13/12/2019 Other Clinician: Referring Physician: Treating Physician/Extender: 13/12/2019 in Treatment: 0 Education Assessment Education Provided To: Patient Education Topics Provided Pressure: Methods: Explain/Verbal Responses: Reinforcements needed, State content correctly Wound/Skin Impairment: Methods: Explain/Verbal Responses: Reinforcements needed, State content correctly Electronic Signature(s) Signed: 12/12/2019 5:58:22 PM By: [id number] RN, BSN Entered By: 07/01/1965 on 12/12/2019 15:45:55 -------------------------------------------------------------------------------- Vitals Details Patient Name: Date of Service: [name], [place] UL A.  12/12/2019 2:45 PM Medical Record Number: Tally Joe Patient Account Number: Arvella Nigh Date of Birth/Sex: Treating RN: [birth year] (54 y.o. [name] Stall) 13/12/2019 Primary Care Alexx Mcburney: 13/12/2019 Other Clinician: Referring Jousha Schwandt: Treating Camielle Sizer/Extender: [id number] in Treatment: 0 Vital Signs Time Taken: 15:22 Temperature (F): 97.5 Height (in): 67 Pulse (bpm): 106 Source: Stated Respiratory Rate (breaths/min): 18 Weight (lbs): 130 Blood Pressure (mmHg): 124/87 Source: Stated Reference Range: 80 - 120 mg / dl Body Mass Index (BMI): 20.4 Electronic Signature(s) Signed: 12/12/2019 5:42:44 PM By: 07/01/1965 RN Entered By: 40 on 12/12/2019 15:23:24

## 2019-12-15 NOTE — Progress Notes (Signed)
[name] ([id number]) , Visit Report for 12/12/2019 Chief Complaint Document Details Patient Name: Date of Service: [name], [name] 12/12/2019 2:45 PM Medical Record Number: [id number] Patient Account Number: [id number] Date of Birth/Sex: Treating RN: January 19, 1966 (54 y.o. Damaris Schooner Primary Care Provider: Tally Joe Other Clinician: Referring Provider: Treating Provider/Extender: Arvella Nigh in Treatment: 0 Information Obtained from: Patient Chief Complaint 09/30/2019; patient is here for review of wound on the right greater trochanter 12/12/19 patient is here for review of wound on the right hip Electronic Signature(s) Signed: 12/15/2019 9:07:52 AM By: Baltazar Najjar MD Entered By: Baltazar Najjar on 12/14/2019 07:56:08 -------------------------------------------------------------------------------- HPI Details Patient Name: Date of Service: [name] FFITT, PA UL A. 12/12/2019 2:45 PM Medical Record Number: [id number] Patient Account Number: [id number] Date of Birth/Sex: Treating RN: 04-Feb-1965 (54 y.o. Damaris Schooner Primary Care Provider: Tally Joe Other Clinician: Referring Provider: Treating Provider/Extender: Arvella Nigh in Treatment: 0 History of Present Illness HPI Description: ADMISSION 09/30/2019 Mr. [name] is an unfortunate 54 year old man that we have cared for previously mostly in our clinic in [place]. In fact I saw him in January 2020. We have always been dealing with a problem in the right greater trochanter. He has advanced spinal muscular atrophy. When we first saw him in 2017 I think the area was actually a larger wound which by to their description required debridement by Dr. Meyer Russel. I last saw him last in 2020 everything was closed. We recommended border foam or equivalent to cover this area and help with friction relief etc. They have been doing this religiously. Apparently about 2  months ago they noticed drainage under the border foam and they developed a small open area here. They are here for our review of this. Past medical history; spinal muscular atrophy which is advanced. He is functionally quadriparetic. He can talk. He is total care being cared for at home I think now by his elderly parents. He was diagnosed at age 45. He apparently has respiratory secretion issues. He therefore has to lie on his right side apparently if he is on his left secretions increase. Type 2 diabetes, he has some form of mattress overlay. He also has palliative care 9/14; the patient had a small but punched out wound over the right greater trochanter which is been problematic for him in the past. However in 2 weeks with a Hydrofera Blue and bordered foam the wound is completely healed today. Somewhat surprising. He has limited by the fact that he has to lie on his right side and night. Apparently this has something to do with pulmonary issues and development of secretions that require suctioning READMISSION 12/12/19 [name] returns to clinic accompanied by his mother.he was last in clinic in late August at which time he had a small stage III wound on his right greater trochanter. This healed up fairly easily. He returns today when his mother saw a small "Marvel Plan" on the problematic area over his right greater trochanter. She became immediately concerned and called for this appointment. I'm not sure that they have done anything different in the antrum but he arrives in clinic today with no open area. There is scar tissue from previous wounds in this area but certainly no evidence of an abscess or anything similar. That all described to much different with his past medical history from what is been previously described however his general status from his spinal muscular atrophy has declined. As noted previously has  difficulty lying on his left side because of respiratory secretion  issues Electronic Signature(s) Signed: 12/15/2019 9:07:52 AM By: Baltazar Najjar MD Entered By: Baltazar Najjar on 12/14/2019 08:00:08 -------------------------------------------------------------------------------- Physical Exam Details Patient Name: Date of Service: [name] FFITT, PA UL A. 12/12/2019 2:45 PM Medical Record Number: [id number] Patient Account Number: [id number] Date of Birth/Sex: Treating RN: [birth year] (54 y.o. Damaris Schooner Primary Care Provider: Tally Joe Other Clinician: Referring Provider: Treating Provider/Extender: Arvella Nigh in Treatment: 0 Constitutional Sitting or standing Blood Pressure is within target range for patient.. Pulse regular and within target range for patient.Marland Kitchen Respirations regular, non-labored and within target range.. Temperature is normal and within the target range for the patient.Marland Kitchen Appears in no distress. Respiratory work of breathing is normal. Respiratory status seems stable. shallower entry but no adventitious sounds. Cardiovascular appears well-hydrated. Psychiatric patient is alert and responsive. Notes wound examination; right greater trochanter. Under illumination there is no open area here. Certainly no evidence of infection, no pustule which is what his mother was concerned about. There is no palpable tenderness no evidence of a bursitis. There is scar tissue and certainly some of the epithelialization here looked a little while normal but there is nothing open Electronic Signature(s) Signed: 12/15/2019 9:07:52 AM By: Baltazar Najjar MD Entered By: Baltazar Najjar on 12/14/2019 08:04:23 -------------------------------------------------------------------------------- Physician Orders Details Patient Name: Date of Service: [name] FFITT, PA UL A. 12/12/2019 2:45 PM Medical Record Number: [id number] Patient Account Number: [id number] Date of Birth/Sex: Treating RN: 1965/12/03 (54 y.o. Damaris Schooner Primary Care Provider: Tally Joe Other Clinician: Referring Provider: Treating Provider/Extender: Arvella Nigh in Treatment: 0 Verbal / Phone Orders: No Diagnosis Coding Discharge From [facility] Discharge from Wound Care Center Dressing Change Frequency Other: - as needed Secondary Dressing Foam Border - to right trochanter to protect Off-Loading Turn and reposition every 2 hours Electronic Signature(s) Signed: 12/12/2019 5:58:22 PM By: Zenaida Deed RN, BSN Signed: 12/15/2019 9:07:52 AM By: Baltazar Najjar MD Entered By: Zenaida Deed on 12/12/2019 15:49:44 -------------------------------------------------------------------------------- Problem List Details Patient Name: Date of Service: [name] FFITT, PA UL A. 12/12/2019 2:45 PM Medical Record Number: [id number] Patient Account Number: [id number] Date of Birth/Sex: Treating RN: 13-Nov-1965 (54 y.o. Damaris Schooner Primary Care Provider: Tally Joe Other Clinician: Referring Provider: Treating Provider/Extender: Arvella Nigh in Treatment: 0 Active Problems ICD-10 Encounter Code Description Active Date MDM Diagnosis G12.9 Spinal muscular atrophy, unspecified 12/12/2019 No Yes L89.219 Pressure ulcer of right hip, unspecified stage 12/14/2019 No Yes Inactive Problems Resolved Problems Electronic Signature(s) Signed: 12/15/2019 9:07:52 AM By: Baltazar Najjar MD Previous Signature: 12/12/2019 5:58:22 PM Version By: Zenaida Deed RN, BSN Entered By: Baltazar Najjar on 12/14/2019 07:54:21 -------------------------------------------------------------------------------- Progress Note Details Patient Name: Date of Service: [name] FFITT, PA UL A. 12/12/2019 2:45 PM Medical Record Number: [id number] Patient Account Number: [id number] Date of Birth/Sex: Treating RN: 05-11-1965 (54 y.o. Damaris Schooner Primary Care Provider: Tally Joe Other Clinician: Referring  Provider: Treating Provider/Extender: Arvella Nigh in Treatment: 0 Subjective Chief Complaint Information obtained from Patient 09/30/2019; patient is here for review of wound on the right greater trochanter 12/12/19 patient is here for review of wound on the right hip History of Present Illness (HPI) ADMISSION 09/30/2019 Mr. Fitzgibbon is an unfortunate 54 year old man that we have cared for previously mostly in our clinic in [place]. In fact I saw him in January 2020. We have always been dealing with a problem in the right greater  trochanter. He has advanced spinal muscular atrophy. When we first saw him in 2017 I think the area was actually a larger wound which by to their description required debridement by Dr. Meyer Russel. I last saw him last in 2020 everything was closed. We recommended border foam or equivalent to cover this area and help with friction relief etc. They have been doing this religiously. Apparently about 2 months ago they noticed drainage under the border foam and they developed a small open area here. They are here for our review of this. Past medical history; spinal muscular atrophy which is advanced. He is functionally quadriparetic. He can talk. He is total care being cared for at home I think now by his elderly parents. He was diagnosed at age 20. He apparently has respiratory secretion issues. He therefore has to lie on his right side apparently if he is on his left secretions increase. Type 2 diabetes, he has some form of mattress overlay. He also has palliative care 9/14; the patient had a small but punched out wound over the right greater trochanter which is been problematic for him in the past. However in 2 weeks with a Hydrofera Blue and bordered foam the wound is completely healed today. Somewhat surprising. He has limited by the fact that he has to lie on his right side and night. Apparently this has something to do with pulmonary issues and  development of secretions that require suctioning READMISSION 12/12/19 [name] returns to clinic accompanied by his mother.he was last in clinic in late August at which time he had a small stage III wound on his right greater trochanter. This healed up fairly easily. He returns today when his mother saw a small "Marvel Plan" on the problematic area over his right greater trochanter. She became immediately concerned and called for this appointment. I'm not sure that they have done anything different in the antrum but he arrives in clinic today with no open area. There is scar tissue from previous wounds in this area but certainly no evidence of an abscess or anything similar. That all described to much different with his past medical history from what is been previously described however his general status from his spinal muscular atrophy has declined. As noted previously has difficulty lying on his left side because of respiratory secretion issues Patient History Information obtained from Patient. Allergies penicillin (Severity: Severe, Reaction: hives) Family History Cancer - Siblings, Diabetes - Father,Mother, Heart Disease - Father,Mother, Hypertension - Mother,Father, Lung Disease - Paternal Grandparents, Thyroid Problems - Mother, No family history of Hereditary Spherocytosis, Kidney Disease, Seizures, Stroke, Tuberculosis. Social History Former smoker - quit 1980s, Marital Status - Single, Alcohol Use - Never, Drug Use - No History, Caffeine Use - Daily - soda. Medical History Eyes Denies history of Cataracts, Glaucoma, Optic Neuritis Ear/Nose/Mouth/Throat Denies history of Chronic sinus problems/congestion, Middle ear problems Hematologic/Lymphatic Denies history of Anemia, Hemophilia, Human Immunodeficiency Virus, Lymphedema, Sickle Cell Disease Respiratory Denies history of Aspiration, Asthma, Chronic Obstructive Pulmonary Disease (COPD), Pneumothorax, Sleep Apnea,  Tuberculosis Cardiovascular Patient has history of Hypertension Denies history of Angina, Arrhythmia, Congestive Heart Failure, Coronary Artery Disease, Deep Vein Thrombosis, Hypotension, Myocardial Infarction, Peripheral Arterial Disease, Peripheral Venous Disease, Phlebitis, Vasculitis Gastrointestinal Denies history of Cirrhosis , Colitis, Crohnoos, Hepatitis A, Hepatitis B, Hepatitis C Endocrine Patient has history of Type II Diabetes Denies history of Type I Diabetes Genitourinary Denies history of End Stage Renal Disease Immunological Denies history of Lupus Erythematosus, Raynaudoos, Scleroderma Integumentary (Skin) Denies history of  History of Burn Musculoskeletal Denies history of Gout, Rheumatoid Arthritis, Osteoarthritis, Osteomyelitis Neurologic Patient has history of Paraplegia, Seizure Disorder - hx Denies history of Dementia, Neuropathy, Quadriplegia Oncologic Denies history of Received Chemotherapy, Received Radiation Psychiatric Patient has history of Confinement Anxiety Denies history of Anorexia/bulimia Hospitalization/Surgery History - muscle biopsy. - hemrrhoidectomy. Medical A Surgical History Notes nd Ear/Nose/Mouth/Throat dysphagia Cardiovascular hypercholesteremia Gastrointestinal GERD, hemorrhoids Genitourinary incontinence Musculoskeletal muscular dystrophy Review of Systems (ROS) Constitutional Symptoms (General Health) Denies complaints or symptoms of Fatigue, Fever, Chills, Marked Weight Change. Eyes Denies complaints or symptoms of Dry Eyes, Vision Changes, Glasses / Contacts. Ear/Nose/Mouth/Throat Denies complaints or symptoms of Chronic sinus problems or rhinitis. Respiratory Denies complaints or symptoms of Chronic or frequent coughs, Shortness of Breath. Cardiovascular Denies complaints or symptoms of Chest pain. Gastrointestinal Denies complaints or symptoms of Frequent diarrhea, Nausea, Vomiting. Endocrine Denies complaints  or symptoms of Heat/cold intolerance. Integumentary (Skin) Denies complaints or symptoms of Wounds. Musculoskeletal Denies complaints or symptoms of Muscle Pain, Muscle Weakness. Neurologic Denies complaints or symptoms of Numbness/parasthesias. Psychiatric Denies complaints or symptoms of Claustrophobia, Suicidal. Objective Constitutional Sitting or standing Blood Pressure is within target range for patient.. Pulse regular and within target range for patient.Marland Kitchen Respirations regular, non-labored and within target range.. Temperature is normal and within the target range for the patient.Marland Kitchen Appears in no distress. Vitals Time Taken: 3:22 PM, Height: 67 in, Source: Stated, Weight: 130 lbs, Source: Stated, BMI: 20.4, Temperature: 97.5 F, Pulse: 106 bpm, Respiratory Rate: 18 breaths/min, Blood Pressure: 124/87 mmHg. Respiratory work of breathing is normal. Respiratory status seems stable. shallower entry but no adventitious sounds. Cardiovascular appears well-hydrated. Psychiatric patient is alert and responsive. General Notes: wound examination; right greater trochanter. Under illumination there is no open area here. Certainly no evidence of infection, no pustule which is what his mother was concerned about. There is no palpable tenderness no evidence of a bursitis. There is scar tissue and certainly some of the epithelialization here looked a little while normal but there is nothing open Assessment Active Problems ICD-10 Spinal muscular atrophy, unspecified Pressure ulcer of right hip, unspecified stage Plan Discharge From [facility] Services: Discharge from Wound Care Center Dressing Change Frequency: Other: - as needed Secondary Dressing: Foam Border - to right trochanter to protect Off-Loading: Turn and reposition every 2 hours #1 there is nothing open here currently although he is certainly at risk #2 his mother describe what sounds like a small pustule however I see nothing over the  greater trochanter area today without 2-3 weeks later #3 I offered them a routine follow-up monthly every second month to see if we could preemptively identify a recidivism issue however they declined this. I told him to call us if we can be of further service. #4 really have nothing else to suggest year other than doing her best offload this problematic area. They use border foam I think that's fine I spent 35 minutes in reviewing this patient's past medical history, face-to-face evaluation and preparation of this record Electronic Signature(s) Signed: 12/15/2019 9:07:52 AM By: Baltazar Najjar MD Entered By: Baltazar Najjar on 12/14/2019 08:06:12 -------------------------------------------------------------------------------- HxROS Details Patient Name: Date of Service: [name] FFITT, PA UL A. 12/12/2019 2:45 PM Medical Record Number: [id number] Patient Account Number: [id number] Date of Birth/Sex: Treating RN: February 15, 1965 (54 y.o. [name] Primary Care Provider: Tally Joe Other Clinician: Referring Provider: Treating Provider/Extender: Arvella Nigh in Treatment: 0 Information Obtained From Patient Constitutional Symptoms (General Health) Complaints and Symptoms: Negative for:  Fatigue; Fever; Chills; Marked Weight Change Eyes Complaints and Symptoms: Negative for: Dry Eyes; Vision Changes; Glasses / Contacts Medical History: Negative for: Cataracts; Glaucoma; Optic Neuritis Ear/Nose/Mouth/Throat Complaints and Symptoms: Negative for: Chronic sinus problems or rhinitis Medical History: Negative for: Chronic sinus problems/congestion; Middle ear problems Past Medical History Notes: dysphagia Respiratory Complaints and Symptoms: Negative for: Chronic or frequent coughs; Shortness of Breath Medical History: Negative for: Aspiration; Asthma; Chronic Obstructive Pulmonary Disease (COPD); Pneumothorax; Sleep Apnea;  Tuberculosis Cardiovascular Complaints and Symptoms: Negative for: Chest pain Medical History: Positive for: Hypertension Negative for: Angina; Arrhythmia; Congestive Heart Failure; Coronary Artery Disease; Deep Vein Thrombosis; Hypotension; Myocardial Infarction; Peripheral Arterial Disease; Peripheral Venous Disease; Phlebitis; Vasculitis Past Medical History Notes: hypercholesteremia Gastrointestinal Complaints and Symptoms: Negative for: Frequent diarrhea; Nausea; Vomiting Medical History: Negative for: Cirrhosis ; Colitis; Crohns; Hepatitis A; Hepatitis B; Hepatitis C Past Medical History Notes: GERD, hemorrhoids Endocrine Complaints and Symptoms: Negative for: Heat/cold intolerance Medical History: Positive for: Type II Diabetes Negative for: Type I Diabetes Time with diabetes: since 2004 Treated with: Insulin Blood sugar tested every day: Yes Tested : 3-4 times per day Integumentary (Skin) Complaints and Symptoms: Negative for: Wounds Medical History: Negative for: History of Burn Musculoskeletal Complaints and Symptoms: Negative for: Muscle Pain; Muscle Weakness Medical History: Negative for: Gout; Rheumatoid Arthritis; Osteoarthritis; Osteomyelitis Past Medical History Notes: muscular dystrophy Neurologic Complaints and Symptoms: Negative for: Numbness/parasthesias Medical History: Positive for: Paraplegia; Seizure Disorder - hx Negative for: Dementia; Neuropathy; Quadriplegia Psychiatric Complaints and Symptoms: Negative for: Claustrophobia; Suicidal Medical History: Positive for: Confinement Anxiety Negative for: Anorexia/bulimia Hematologic/Lymphatic Medical History: Negative for: Anemia; Hemophilia; Human Immunodeficiency Virus; Lymphedema; Sickle Cell Disease Genitourinary Medical History: Negative for: End Stage Renal Disease Past Medical History Notes: incontinence Immunological Medical History: Negative for: Lupus Erythematosus; Raynauds;  Scleroderma Oncologic Medical History: Negative for: Received Chemotherapy; Received Radiation Immunizations Pneumococcal Vaccine: Received Pneumococcal Vaccination: No Implantable Devices None Hospitalization / Surgery History Type of Hospitalization/Surgery muscle biopsy hemrrhoidectomy Family and Social History Cancer: Yes - Siblings; Diabetes: Yes - Father,Mother; Heart Disease: Yes - Father,Mother; Hereditary Spherocytosis: No; Hypertension: Yes - Mother,Father; Kidney Disease: No; Lung Disease: Yes - Paternal Grandparents; Seizures: No; Stroke: No; Thyroid Problems: Yes - Mother; Tuberculosis: No; Former smoker - quit 1980s; Marital Status - Single; Alcohol Use: Never; Drug Use: No History; Caffeine Use: Daily - soda; Financial Concerns: No; Food, Clothing or Shelter Needs: No; Support System Lacking: No; Transportation Concerns: No Electronic Signature(s) Signed: 12/12/2019 5:42:44 PM By: Yevonne PaxEpps, Carrie RN Signed: 12/15/2019 9:07:52 AM By: Baltazar Najjarobson, Riana Tessmer MD Entered By: Yevonne PaxEpps, Carrie on 12/12/2019 15:24:56 -------------------------------------------------------------------------------- SuperBill Details Patient Name: Date of Service: [name] FFITT, PA UL A. 12/12/2019 Medical Record Number: [id number] Patient Account Number: [id number] Date of Birth/Sex: Treating RN: 1965-08-18 (54 y.o. Damaris SchoonerM) Boehlein, Linda Primary Care Provider: Tally JoeSwayne, David Other Clinician: Referring Provider: Treating Provider/Extender: Arvella Nighobson, [place] Glaus Swayne, David Weeks in Treatment: 0 Diagnosis Coding ICD-10 Codes Code Description G12.9 Spinal muscular atrophy, unspecified L89.219 Pressure ulcer of right hip, unspecified stage Facility Procedures CPT4 Code: [id number] Description: 99213 - WOUND CARE VISIT-LEV 3 EST PT Modifier: Quantity: 1 Physician Procedures : CPT4 Code Description Modifier [id number] 99214 - WC PHYS LEVEL 4 - EST PT ICD-10 Diagnosis Description G12.9 Spinal muscular atrophy,  unspecified L89.219 Pressure ulcer of right hip, unspecified stage Quantity: 1 Electronic Signature(s) Signed: 12/15/2019 9:07:52 AM By: Baltazar Najjarobson, Finnleigh Marchetti MD Previous Signature: 12/12/2019 5:58:22 PM Version By: Zenaida DeedBoehlein, Linda RN, BSN Entered By: Baltazar Najjarobson, Kiyomi Pallo on 12/14/2019 08:06:50

## 2019-12-22 ENCOUNTER — Other Ambulatory Visit: Payer: Medicare Other | Admitting: Internal Medicine

## 2019-12-22 ENCOUNTER — Telehealth: Payer: Self-pay

## 2019-12-22 NOTE — Telephone Encounter (Signed)
Phone call placed to patient's mother, Harriett Sine, to provide update that NP will be there later in the afternoon approximately 3pm.

## 2019-12-23 ENCOUNTER — Other Ambulatory Visit: Payer: Self-pay

## 2020-01-19 ENCOUNTER — Other Ambulatory Visit: Payer: Self-pay | Admitting: Gastroenterology

## 2020-01-26 ENCOUNTER — Telehealth: Payer: Self-pay | Admitting: Internal Medicine

## 2020-01-26 NOTE — Telephone Encounter (Signed)
Patient was turned over to hospice and wanted to inform you he will not be coming back. FYI

## 2020-01-26 NOTE — Telephone Encounter (Signed)
Oh, very sad to hear that!!!

## 2020-01-31 DIAGNOSIS — Z794 Long term (current) use of insulin: Secondary | ICD-10-CM | POA: Diagnosis not present

## 2020-01-31 DIAGNOSIS — R1310 Dysphagia, unspecified: Secondary | ICD-10-CM | POA: Diagnosis not present

## 2020-01-31 DIAGNOSIS — L89219 Pressure ulcer of right hip, unspecified stage: Secondary | ICD-10-CM | POA: Diagnosis not present

## 2020-01-31 DIAGNOSIS — E119 Type 2 diabetes mellitus without complications: Secondary | ICD-10-CM | POA: Diagnosis not present

## 2020-01-31 DIAGNOSIS — L89319 Pressure ulcer of right buttock, unspecified stage: Secondary | ICD-10-CM | POA: Diagnosis not present

## 2020-02-01 DIAGNOSIS — Z794 Long term (current) use of insulin: Secondary | ICD-10-CM | POA: Diagnosis not present

## 2020-02-01 DIAGNOSIS — E119 Type 2 diabetes mellitus without complications: Secondary | ICD-10-CM | POA: Diagnosis not present

## 2020-02-01 DIAGNOSIS — R1310 Dysphagia, unspecified: Secondary | ICD-10-CM | POA: Diagnosis not present

## 2020-02-27 ENCOUNTER — Ambulatory Visit: Payer: Medicare Other | Admitting: Internal Medicine

## 2020-03-02 DEATH — deceased

## 2020-03-30 ENCOUNTER — Encounter (HOSPITAL_COMMUNITY): Payer: Self-pay

## 2020-03-30 ENCOUNTER — Ambulatory Visit (HOSPITAL_COMMUNITY): Admit: 2020-03-30 | Payer: Medicare Other | Admitting: Gastroenterology

## 2020-03-30 SURGERY — ESOPHAGOGASTRODUODENOSCOPY (EGD) WITH PROPOFOL
Anesthesia: Monitor Anesthesia Care
